# Patient Record
Sex: Female | Born: 1960 | Race: White | Hispanic: No | Marital: Married | Smoking: Never smoker
Health system: Southern US, Community
[De-identification: ages and names within clinical notes are randomized; demographics above are authoritative.]

## PROBLEM LIST (undated history)

## (undated) DIAGNOSIS — I341 Nonrheumatic mitral (valve) prolapse: Secondary | ICD-10-CM

## (undated) DIAGNOSIS — E785 Hyperlipidemia, unspecified: Secondary | ICD-10-CM

## (undated) DIAGNOSIS — E039 Hypothyroidism, unspecified: Secondary | ICD-10-CM

## (undated) DIAGNOSIS — F329 Major depressive disorder, single episode, unspecified: Secondary | ICD-10-CM

## (undated) DIAGNOSIS — T8859XA Other complications of anesthesia, initial encounter: Secondary | ICD-10-CM

## (undated) DIAGNOSIS — K219 Gastro-esophageal reflux disease without esophagitis: Secondary | ICD-10-CM

## (undated) DIAGNOSIS — L509 Urticaria, unspecified: Secondary | ICD-10-CM

## (undated) DIAGNOSIS — F32A Depression, unspecified: Secondary | ICD-10-CM

## (undated) DIAGNOSIS — E079 Disorder of thyroid, unspecified: Secondary | ICD-10-CM

## (undated) DIAGNOSIS — L508 Other urticaria: Secondary | ICD-10-CM

## (undated) DIAGNOSIS — M810 Age-related osteoporosis without current pathological fracture: Secondary | ICD-10-CM

## (undated) HISTORY — DX: Other urticaria: L50.8

## (undated) HISTORY — PX: ANAL FISSURE REPAIR: SHX2312

## (undated) HISTORY — PX: EXPLORATORY LAPAROTOMY: SUR591

## (undated) HISTORY — PX: TONSILLECTOMY: SUR1361

## (undated) HISTORY — PX: BREAST BIOPSY: SHX20

## (undated) HISTORY — PX: ABDOMINAL HYSTERECTOMY: SHX81

## (undated) HISTORY — DX: Age-related osteoporosis without current pathological fracture: M81.0

---

## 1999-08-24 ENCOUNTER — Encounter: Payer: Self-pay | Admitting: Gastroenterology

## 1999-08-24 ENCOUNTER — Encounter: Admission: RE | Admit: 1999-08-24 | Discharge: 1999-08-24 | Payer: Self-pay | Admitting: Gastroenterology

## 2000-02-22 ENCOUNTER — Encounter: Admission: RE | Admit: 2000-02-22 | Discharge: 2000-02-22 | Payer: Self-pay | Admitting: Family Medicine

## 2000-02-22 ENCOUNTER — Encounter: Payer: Self-pay | Admitting: Family Medicine

## 2000-06-02 ENCOUNTER — Encounter: Payer: Self-pay | Admitting: Surgery

## 2000-06-06 ENCOUNTER — Ambulatory Visit (HOSPITAL_COMMUNITY): Admission: RE | Admit: 2000-06-06 | Discharge: 2000-06-06 | Payer: Self-pay | Admitting: Surgery

## 2000-10-14 ENCOUNTER — Emergency Department (HOSPITAL_COMMUNITY): Admission: EM | Admit: 2000-10-14 | Discharge: 2000-10-14 | Payer: Self-pay | Admitting: Emergency Medicine

## 2000-10-14 ENCOUNTER — Encounter: Payer: Self-pay | Admitting: Surgery

## 2000-11-02 ENCOUNTER — Emergency Department (HOSPITAL_COMMUNITY): Admission: EM | Admit: 2000-11-02 | Discharge: 2000-11-02 | Payer: Self-pay | Admitting: Emergency Medicine

## 2000-11-06 ENCOUNTER — Encounter: Payer: Self-pay | Admitting: Gastroenterology

## 2000-11-06 ENCOUNTER — Encounter: Admission: RE | Admit: 2000-11-06 | Discharge: 2000-11-06 | Payer: Self-pay | Admitting: Gastroenterology

## 2000-11-15 ENCOUNTER — Encounter: Payer: Self-pay | Admitting: Surgery

## 2000-11-15 ENCOUNTER — Ambulatory Visit (HOSPITAL_COMMUNITY): Admission: RE | Admit: 2000-11-15 | Discharge: 2000-11-15 | Payer: Self-pay | Admitting: Surgery

## 2002-10-31 ENCOUNTER — Encounter: Payer: Self-pay | Admitting: Emergency Medicine

## 2002-10-31 ENCOUNTER — Observation Stay (HOSPITAL_COMMUNITY): Admission: EM | Admit: 2002-10-31 | Discharge: 2002-11-01 | Payer: Self-pay | Admitting: Emergency Medicine

## 2002-11-01 ENCOUNTER — Encounter: Payer: Self-pay | Admitting: Internal Medicine

## 2002-12-25 ENCOUNTER — Encounter: Admission: RE | Admit: 2002-12-25 | Discharge: 2002-12-25 | Payer: Self-pay | Admitting: Gastroenterology

## 2002-12-25 ENCOUNTER — Encounter: Payer: Self-pay | Admitting: Gastroenterology

## 2003-06-24 ENCOUNTER — Encounter: Admission: RE | Admit: 2003-06-24 | Discharge: 2003-09-22 | Payer: Self-pay | Admitting: Family Medicine

## 2003-08-01 ENCOUNTER — Encounter: Admission: RE | Admit: 2003-08-01 | Discharge: 2003-08-01 | Payer: Self-pay | Admitting: Neurosurgery

## 2003-09-17 ENCOUNTER — Encounter: Admission: RE | Admit: 2003-09-17 | Discharge: 2003-12-16 | Payer: Self-pay | Admitting: Family Medicine

## 2003-12-24 ENCOUNTER — Encounter: Admission: RE | Admit: 2003-12-24 | Discharge: 2004-03-23 | Payer: Self-pay | Admitting: Family Medicine

## 2004-01-18 ENCOUNTER — Encounter: Admission: RE | Admit: 2004-01-18 | Discharge: 2004-01-18 | Payer: Self-pay | Admitting: Family Medicine

## 2004-03-26 ENCOUNTER — Ambulatory Visit: Payer: Self-pay | Admitting: Family Medicine

## 2004-07-16 ENCOUNTER — Ambulatory Visit: Payer: Self-pay | Admitting: Family Medicine

## 2004-12-21 ENCOUNTER — Ambulatory Visit: Payer: Self-pay | Admitting: Family Medicine

## 2005-02-17 ENCOUNTER — Ambulatory Visit: Payer: Self-pay | Admitting: Family Medicine

## 2005-11-22 ENCOUNTER — Ambulatory Visit: Payer: Self-pay | Admitting: Internal Medicine

## 2005-11-29 ENCOUNTER — Encounter: Admission: RE | Admit: 2005-11-29 | Discharge: 2005-11-29 | Payer: Self-pay | Admitting: Internal Medicine

## 2005-12-28 ENCOUNTER — Encounter: Admission: RE | Admit: 2005-12-28 | Discharge: 2005-12-28 | Payer: Self-pay | Admitting: Neurological Surgery

## 2006-01-24 ENCOUNTER — Ambulatory Visit: Payer: Self-pay | Admitting: Internal Medicine

## 2006-04-03 ENCOUNTER — Ambulatory Visit: Payer: Self-pay | Admitting: Family Medicine

## 2006-04-05 ENCOUNTER — Ambulatory Visit: Payer: Self-pay | Admitting: Family Medicine

## 2006-05-22 ENCOUNTER — Ambulatory Visit: Payer: Self-pay | Admitting: Family Medicine

## 2006-12-08 DIAGNOSIS — K219 Gastro-esophageal reflux disease without esophagitis: Secondary | ICD-10-CM | POA: Insufficient documentation

## 2008-02-01 IMAGING — CR DG HAND COMPLETE 3+V*L*
2 series · 2 of 2 positions shown · non-contrast
Comparison: none

CLINICAL DATA: Swelling in middle finger, evaluate for possible arthritis.
 RIGHT HAND:
 Three views of the right hand show normal alignment with normal joint spaces.  No erosive changes are seen.  The radiocarpal joint space and carpal bones appear normal.

[view not recorded (1 of 2)]
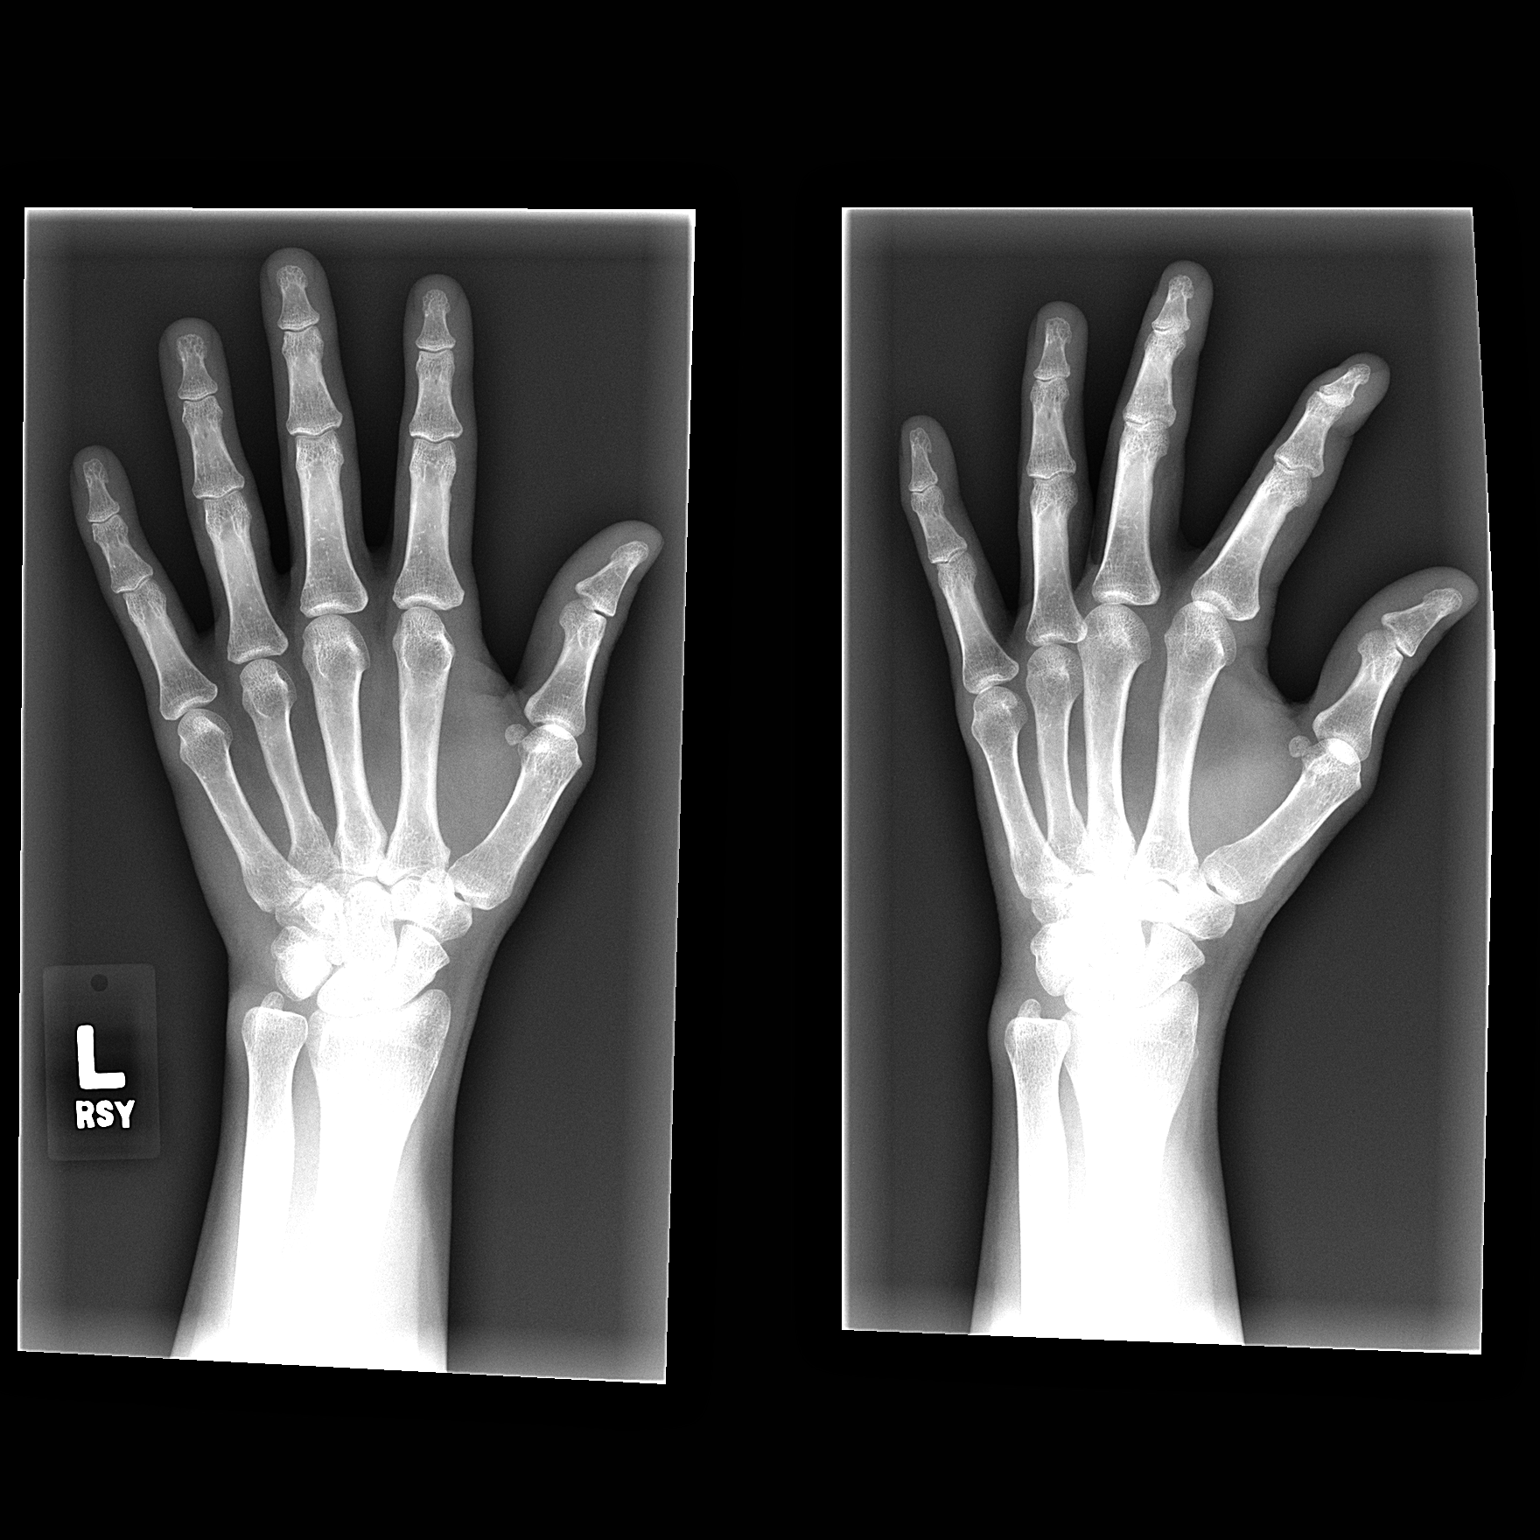

[view not recorded (2 of 2)]
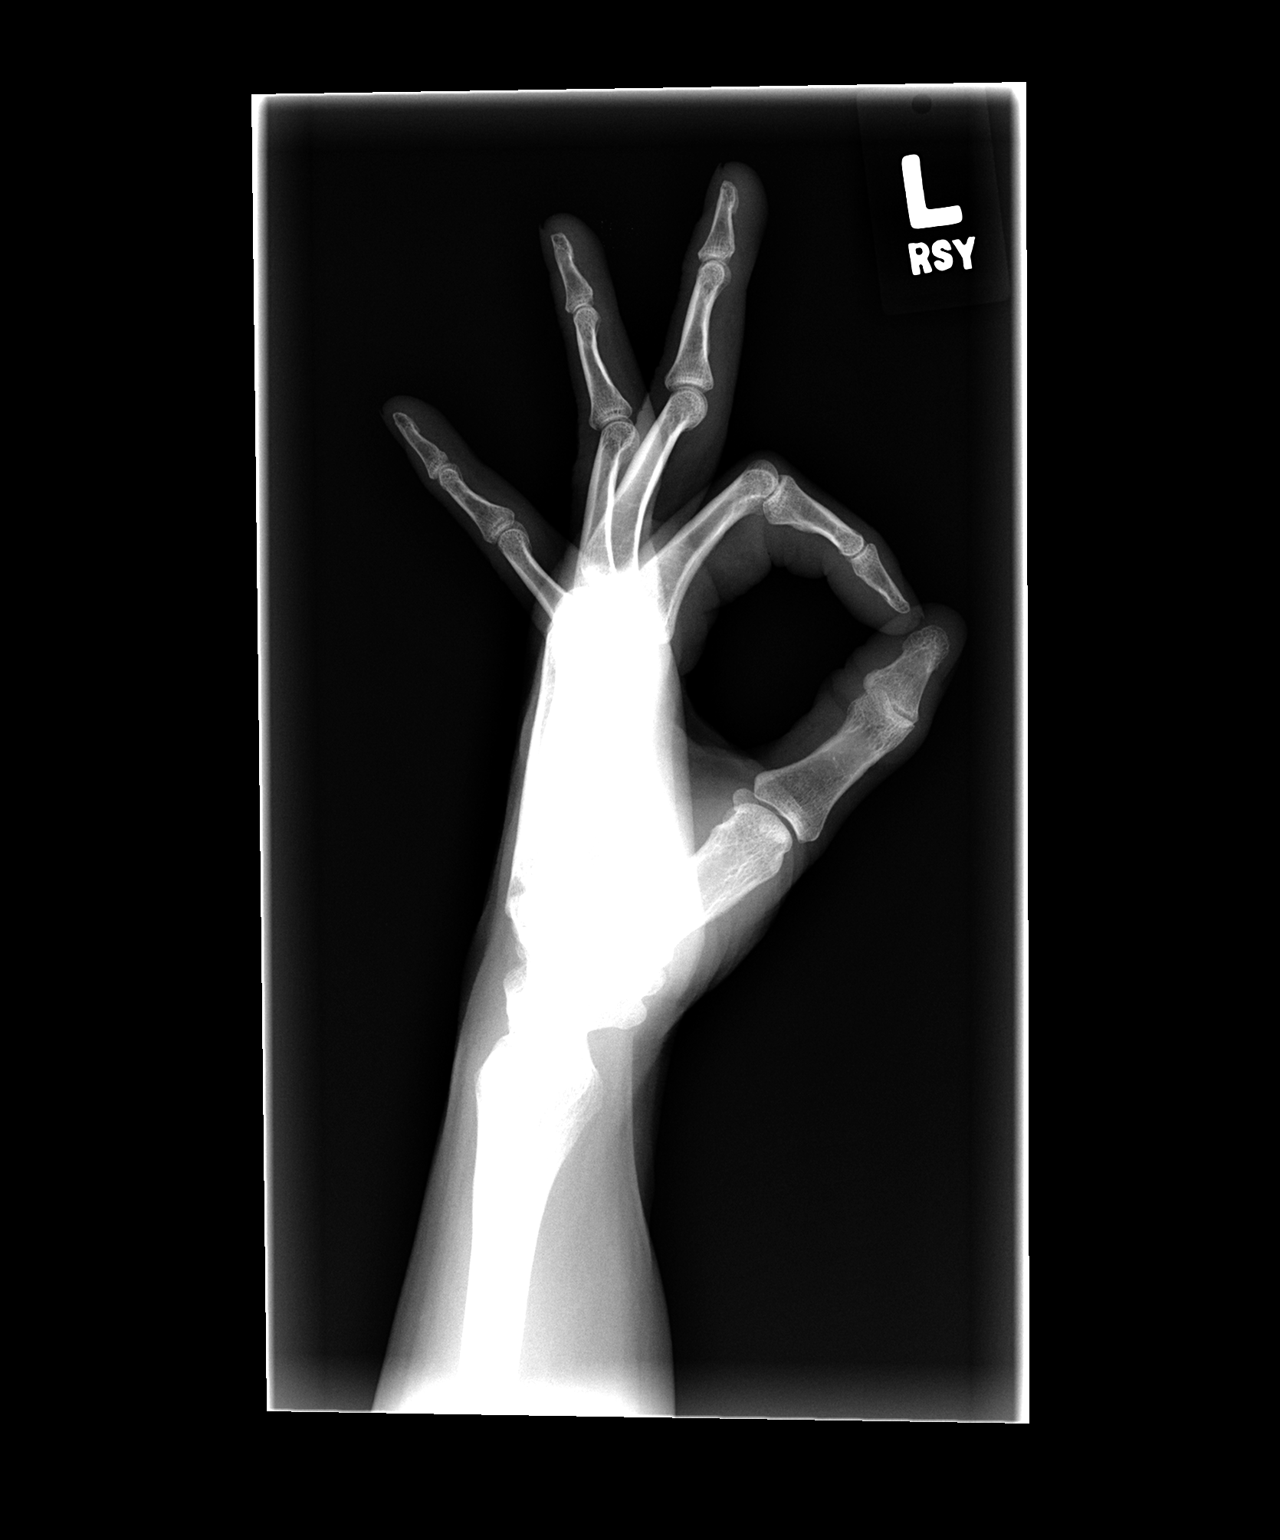

[2 of 2 positions shown; findings below may reference images not displayed]

IMPRESSION: Negative right hand.  No erosions.
 LEFT HAND:
 Three views of the left hand show no acute abnormality.  Alignment is normal.  No erosions are noted.
IMPRESSION: Negative left hand.

## 2009-12-26 ENCOUNTER — Ambulatory Visit: Payer: Self-pay | Admitting: Family Medicine

## 2009-12-26 DIAGNOSIS — S93609A Unspecified sprain of unspecified foot, initial encounter: Secondary | ICD-10-CM | POA: Insufficient documentation

## 2009-12-26 DIAGNOSIS — E782 Mixed hyperlipidemia: Secondary | ICD-10-CM | POA: Insufficient documentation

## 2009-12-26 DIAGNOSIS — F411 Generalized anxiety disorder: Secondary | ICD-10-CM | POA: Insufficient documentation

## 2009-12-26 DIAGNOSIS — M949 Disorder of cartilage, unspecified: Secondary | ICD-10-CM

## 2009-12-26 DIAGNOSIS — M79609 Pain in unspecified limb: Secondary | ICD-10-CM | POA: Insufficient documentation

## 2009-12-26 DIAGNOSIS — F329 Major depressive disorder, single episode, unspecified: Secondary | ICD-10-CM

## 2009-12-26 DIAGNOSIS — M899 Disorder of bone, unspecified: Secondary | ICD-10-CM | POA: Insufficient documentation

## 2009-12-26 DIAGNOSIS — E785 Hyperlipidemia, unspecified: Secondary | ICD-10-CM | POA: Insufficient documentation

## 2009-12-26 DIAGNOSIS — F3289 Other specified depressive episodes: Secondary | ICD-10-CM | POA: Insufficient documentation

## 2010-06-06 ENCOUNTER — Encounter: Payer: Self-pay | Admitting: Neurosurgery

## 2010-06-15 NOTE — Assessment & Plan Note (Signed)
Summary: R foot pain x last night rm 4   Vital Signs:  Patient Profile:   50 Years Old Female CC:      R foot pain x last night Height:     60 inches Weight:      156 pounds O2 Sat:      100 % O2 treatment:    Room Air Temp:     98.3 degrees F oral Pulse rate:   78 / minute Pulse rhythm:   regular Resp:     16 per minute BP sitting:   131 / 80  (left arm)  Vitals Entered By: Areta Haber CMA (December 26, 2009 11:32 AM)                  Current Allergies (reviewed today): ! SULFA        History of Present Illness Chief Complaint: R foot pain x last night History of Present Illness:  Subjective:  Patient twisted right foot on stairs last night and felt a popping sensation.  She has had persistent pain over the mid-foot with weight-bearing.  Current Problems: FOOT SPRAIN, RIGHT (ICD-845.10) FOOT PAIN, RIGHT (ICD-729.5) DEPRESSION (ICD-311) ANXIETY (ICD-300.00) FAMILY HISTORY OF MELANOMA (ICD-V16.8) HYPERLIPIDEMIA (ICD-272.4) OSTEOPENIA (ICD-733.90) GERD (ICD-530.81)   Current Meds LOVASTATIN 20 MG TABS (LOVASTATIN) 1 tab by mouth once daily PROTONIX 40 MG TBEC (PANTOPRAZOLE SODIUM) 1 tab by mouth once daily MIL-A-MULSION 10-15000 UNIT/DROP EMUL (MULTIPLE VITAMIN) every 4 weeks ZOLOFT 100 MG TABS (SERTRALINE HCL) 1 tab by mouth once daily ABILIFY 2 MG TABS (ARIPIPRAZOLE) 1 tab by mouth once daily LAMICTAL 150 MG TABS (LAMOTRIGINE) 1 tab by mouth once daily SYNTHROID 50 MCG TABS (LEVOTHYROXINE SODIUM) 1 tab by mouth once daily  REVIEW OF SYSTEMS Constitutional Symptoms      Denies fever, chills, night sweats, weight loss, weight gain, and fatigue.  Eyes       Denies change in vision, eye pain, eye discharge, glasses, contact lenses, and eye surgery. Ear/Nose/Throat/Mouth       Denies hearing loss/aids, change in hearing, ear pain, ear discharge, dizziness, frequent runny nose, frequent nose bleeds, sinus problems, sore throat, hoarseness, and tooth pain  or bleeding.  Respiratory       Denies dry cough, productive cough, wheezing, shortness of breath, asthma, bronchitis, and emphysema/COPD.  Cardiovascular       Denies murmurs, chest pain, and tires easily with exhertion.    Gastrointestinal       Denies stomach pain, nausea/vomiting, diarrhea, constipation, blood in bowel movements, and indigestion. Genitourniary       Denies painful urination, kidney stones, and loss of urinary control. Neurological       Denies paralysis, seizures, and fainting/blackouts. Musculoskeletal       Complains of decreased range of motion and swelling.      Denies muscle pain, joint pain, joint stiffness, redness, muscle weakness, and gout.      Comments: R foot pain x last night Skin       Denies bruising, unusual mles/lumps or sores, and hair/skin or nail changes.  Psych       Denies mood changes, temper/anger issues, anxiety/stress, speech problems, depression, and sleep problems. Other Comments: Pt states she stepped on laundry last night and twisted her R foot.   Past History:  Past Surgical History: Last updated: 12/08/2006 T&A-1993 TAH abdominal-06/2002 Caesarean section-1995 Cystoscopy1985 Anal Fissure Repair-1990 Endoscopy-1996  Past Medical History: MVP TMJ GERD Osteopenia Eating disorder Hyperlipidemia Vitamin D Deficiency Anxiety Depression  Family History: Family History of Arthritis Family History Hypertension Family History of Melanoma Family History of Prostate CA 1st degree relative <50 Family History Thyroid disease  Social History: Never Smoked Alcohol use-no Drug use-no Regular exercise-no Smoking Status:  never Drug Use:  no Does Patient Exercise:  no   Objective:  Appearance:  Patient appears healthy, stated age, and in no acute distress  Right ankle:  Nontender, full range of motion  Right foot:  No swelling or deformity.  Tenderness dorsally over 2nd, 3rd, and 4th metatarsals.  Distal neurovascular  intact.  Toes have full range of motion  X-ray right foot:  negative Assessment New Problems: FOOT SPRAIN, RIGHT (ICD-845.10) FOOT PAIN, RIGHT (ICD-729.5) DEPRESSION (ICD-311) ANXIETY (ICD-300.00) FAMILY HISTORY OF MELANOMA (ICD-V16.8) HYPERLIPIDEMIA (ICD-272.4) OSTEOPENIA (ICD-733.90)   Plan New Orders: T-DG Foot Complete*R* [73630] New Patient Level III [78295] Planning Comments:   Continue Aleve, 2 tabs two times a day.  Apply ice pack several times daily.  Wear well-fitting footwear with arch support.  Avoid running, jogging until improved.  Begin stretching exercises ([employer] information and instruction patient handout given)  Follow-up with [facility] if not improving several weeks.   The patient and/or caregiver has been counseled thoroughly with regard to medications prescribed including dosage, schedule, interactions, rationale for use, and possible side effects and they verbalize understanding.  Diagnoses and expected course of recovery discussed and will return if not improved as expected or if the condition worsens. Patient and/or caregiver verbalized understanding.   Orders Added: 1)  T-DG Foot Complete*R* [73630] 2)  New Patient Level III [62130]  Appended Document: R foot pain x last night rm 4 F/U call to patient @ [phone] @ 5:50pm - Unable to leave message.

## 2010-09-09 ENCOUNTER — Telehealth: Payer: Self-pay | Admitting: Internal Medicine

## 2010-10-01 NOTE — H&P (Signed)
NAME:  Kathleen Soto, Kathleen Soto                       ACCOUNT NO.:  [id number]   MEDICAL RECORD NO.:  [id number]                   PATIENT TYPE:  INP   LOCATION:  4705                                 FACILITY:  [facility]   PHYSICIAN:  Pricilla Riffle, M.D.                 DATE OF BIRTH:  10-Dec-1960   DATE OF ADMISSION:  10/31/2002  DATE OF DISCHARGE:                                HISTORY & PHYSICAL   IDENTIFICATION:  Kathleen Soto is a 50 year old woman with no prior cardiac  history who woke up this morning with chest tightness.   HISTORY OF PRESENT ILLNESS:  The patient has recently finished a steroid  Dosepak a couple of days ago.  She notes Tuesday having the last dose of  prednisone and feeling somewhat jittery.  She did not take it since  yesterday, she just felt blah.  This morning about 6:00 a.m. she woke up  with chest pain radiating to her shoulders, axilla, up her throat with some  diaphoresis and dyspnea.  Pain waxed and waned.  She slowly got ready for  work.  She said if she were to do things the pain would increase; she had to  slow down and wait for it to ease off.  Again, she did this through the  morning.  She continued to have chest pressure, some palpitations, and felt  her heart pounding harder.  She noted overall at most severe it was 8/10.  At work she continued to have the chest pressure again waxing and waning.  She spoke to Dr. Clent Ridges and was sent to see Dr. Andrey Campanile and from there was  brought to the Emergency Room.   She notes no prior episodes of this.  She notes some diarrhea this morning  and slight nausea, no vomiting.  No fever or chills.  No pleuritic  component.  No positional component.   No one is sick at home.   ALLERGIES:  SULFA.   CURRENT MEDICATIONS:  1. Wellbutrin 300 q.d.  2. Nexium 40 q.d.   PAST MEDICAL HISTORY:  1. Anxiety/depression.  2. History of mitral valve prolapse, last seen in 1985 by Cardiology.  3. History of palpitations 2 days ago.  4. History of bulging cervical disk, treated with prednisone.  5. History of GE reflux.  NOTE:  The current pain is different from reflux.   PAST SURGICAL HISTORY:  1. TAH.  2. Laparoscopy.  3. Tonsillectomy.  4. C-section x 2.   SOCIAL HISTORY:  The patient is married and works at [employer].  Does not smoke.  Drinks occasionally.   FAMILY HISTORY:  Father with hypertension and CLL.  Two brothers well.  Two  sisters, one with rheumatoid arthritis.   REVIEW OF SYSTEMS:  Overall negative as above problem, except as noted  above, have reviewed.   PHYSICAL EXAMINATION:  The patient is in no acute distress.  Blood pressure  124/74.  Pulse 82 and regular.  Temperature 97.  NECK:  JVP is normal.  No bruits.  LUNGS:  Clear.  CARDIAC:  Regular rate and rhythm.  Normal S1 and S2.  No S3, S4, murmurs,  or clicks.  CHEST:  Nontender.  ABDOMEN:  Supple.  Nontender.  No hepatosplenomegaly.  EXTREMITIES:  2+ pulses throughout no lower extremities.   12-lead EKG shows normal sinus rhythm at a rate of 83 beats per minute.   IMPRESSION:  Kathleen Soto is a 50 year old woman with history of chest pain.  This is new onset.  She has a history of reflux.  This is different.  It is  not pleuritic.  It does not appear to be musculoskeletal.  Not convinced her  pain is cardiac, maybe GI still.  I cannot explain it for any reason.  I  would recommended, therefore, ruling out for MI, admit.  Telemetry.  Check  CK-MB.  If enzymes are negative, plan a stress Cardiolite to further define.  We will begin low dose Lopressor, heparin, aspirin.  We will also increase  Protonix b.i.d.                                                Pricilla Riffle, M.D.    PVR/MEDQ  D:  10/31/2002  T:  11/01/2002  Job:  [id number]

## 2010-10-01 NOTE — Op Note (Signed)
[place]  Patient:    Kathleen Soto, Kathleen Soto                   MRN: [id number] Attending:  Thornton Park. Daphine Deutscher, M.D. CC:         Evette Georges, M.D. [facility]  Dr. Houston Siren, Gregor Hams,  [facility]   Operative Report  CCS# [phone]  PREOPERATIVE DIAGNOSIS:  Left breast mass.  POSTOPERATIVE DIAGNOSIS:  Left breast mass, probably benign.  OPERATION PERFORMED:  Left breast biopsy.  SURGEON:  Thornton Park. Daphine Deutscher, M.D.  ANESTHESIA:  MAC/  DESCRIPTION OF PROCEDURE:  [name] is a 50 year old lady with a positive family history of breast cancer who was seen in the office for a palpable mass in the left breast.  This was a soft, discrete mass at about the 4 oclock position in the left breast, two fingerbreadths from the areolar margin.  Preoperatively, she and I localized the area and I marked it.  The breast was then prepped with Betadine and draped sterilely.  It was infiltrated with a mixture of Marcaine and lidocaine.  A curvilinear incision was made overlying the mass I dissected down to it.  There appeared to be some sclerotic fat down on the normal breast tissue.  I excised this in toto with two samples sent for permanent sections.  There was nothing grossly appearing to be cancer in this.  The area was then inspected carefully for bleeding. One figure-of-eight suture was placed in the base at the site of oozing.  The remaining portions of bleeding were controlled with electrocautery.  It was then irrigated with a mixture of Marcaine and lidocaine which was allowed to stay in there for a couple of minutes and then was closed with 4-0 Vicryl subcuticularly with benzoin and Steri-Strips.  The patient seemed to tolerate the procedure well.  She will be given a prescription for Tylox to take if needed for pain and be followed up in the office in two weeks. DD:  06/06/00 TD:  06/06/00 Job: [id number]

## 2010-10-01 NOTE — Assessment & Plan Note (Signed)
[facility]                                 ON-CALL NOTE   NAME:Kathleen Soto, Kathleen Soto                      MRN:          [id number]  DATE:06/01/2006                            DOB:          10-16-1960    PRIMARY CARE Miyeko Mahlum:  __________   HOME OFFICE:  [place]   TIME OF INTERACTION:  9:45 p.m.   PHONE NUMBER:  [phone]   OBJECTIVE:  The patient has nausea and diarrhea.  The last couple of  days, she had what she felt like pre-bronchitis symptoms with feelings  of congestion in her chest, was out of town today most of the day and,  on her way back, started becoming queasy, has felt nauseated with loose  bowels later today.  Has been trying to eat crackers and clear liquids.  She still has her appetite and is able to drink and keep things down,  but is requesting medication for nausea.  Has been on Phenergan in the  past without difficulty.   ASSESSMENT:  Presumed gastroenteritis.   PLAN:  Called a prescription to [facility] in [place] at [phone] for  Phenergan 25 mg suppositories one per rectum q. 6 p.r.n. #5 with no  refills, and Phenergan 25 mg tablets one p.o. q. 6 p.r.n., both for  nausea, #10 with no refills.     Arta Silence, MD  Electronically Signed    RNS/MedQ  DD: 06/01/2006  DT: 06/01/2006  Job #: [id number]

## 2010-10-01 NOTE — Assessment & Plan Note (Signed)
[facility]                                 ON-CALL NOTE   NAME:HODGINKatja, Blue                      MRN:          [id number]  DATE:05/13/2006                            DOB:          09-23-1960    Phone number [phone] cell phone.  The patient woke today with dry,  itchy eyes, and some crusting and redness, thinks she may be getting  pink eye, although it is somewhat better as she speaks.  She has no  associated fevers.  She does have young children, no contact, no  significant URI symptoms.  She is an [employer] fundraiser.   ALLERGIES:  SULFA.   We will call in a prescription for tobramycin 1 drop q.i.d. to one or  both eyes as affected.  I will have her start these if symptoms progress  with purulent discharge, otherwise can follow up as needed.  This is  called in to [facility] at [facility].     Neta Mends. Panosh, MD  Electronically Signed    WKP/MedQ  DD: 05/13/2006  DT: 05/13/2006  Job #: [id number]

## 2010-10-01 NOTE — Consult Note (Signed)
[facility]  Patient:    Kathleen Soto, Kathleen Soto                    MRN: [id number] Proc. Date: 11/02/00 Adm. Date:  [id number] Disc. Date: [id number] Attending:  Benny Lennert CC:         Petra Kuba, M.D.             Houston Siren, M.D.; [facility] [address] #                          Consultation Report  CHIEF COMPLAINT:  Chronic abdominal pain.  HISTORY OF PRESENT ILLNESS:  This is a 50 year old white female who has a six week history of abdominal pain.  She states it hurts constantly in the mid and lower abdomen and across her back.  For the past four weeks it has been a little more sharp and catching.  She was initially evaluated at the [facility] ER on October 14, 2000 at which time she saw Dr. Cicero Duck.  A CT of the abdomen and pelvis was completely normal and specifically the appendix appeared normal.  The ovaries had small cysts.  There was no free fluid or focal inflammatory process.  She has been evaluated as an outpatient by Dr. Leary Roca here in [place].  She has been evaluated by her gynecologist Dr. Houston Siren of [facility] in [place].  She has not had any further testing other than a vaginal ultrasound on June 5 at Dr. Anders Grant office and she was told that was normal.  She has not had any colonoscopy or other evaluations.  She states for the past two weeks she has had some different problems.  Nine days ago she vomited and had some diarrhea and then the vomiting and diarrhea resolved.  Five days ago she had one episode of vomiting and that has now resolved.  She has been constipated mostly over the past week.  She is able to eat and has not had any fever or chills.  Denies vaginal discharge or blood in the stool.  She notes some abdominal bloating over the past three days.  She saw Dr. Houston Siren in the office today.  He gave her a shot of Rocephin in case she had pelvic inflammatory  disease.  He got a CBC which was completely normal.  She was referred over to our office for evaluation for general surgery.  She saw Dr. Mosetta Anis in the office today who felt she had right lower quadrant tenderness and asked me to evaluate her in the [facility] ER.  Currently, the patient is in no acute distress other than being frustrated by the extensive workup.  She states that she is scheduled for a barium enema on Monday at Dr. Adriana Simas suggestion.  She also states that she is scheduled for a diagnostic laparoscopy by Dr. Houston Siren in early July.  PAST MEDICAL HISTORY:  The patient has had anal fissure repair by me.  She has had two cesarean sections.  She had a tonsillectomy.  She had a cystoscopy by Dr. Mickel Crow in 1985.  She has had a breast biopsy by Dr. Wenda Low.  She also has mitral valve prolapse and takes SBE prophylaxis.  She has mild reflux symptoms.  She has a history of depression.  She had an upper endoscopy by Dr. Victorino Dike in 1996 and was told  she had a very tiny hiatal hernia, but no esophagitis.  CURRENT MEDICATIONS: 1. Birth control pills. 2. Wellbutrin SR 150 mg q.d. 3. Elavil q.h.s. p.r.n. 4. Prilosec p.r.n.  ALLERGIES:  SULFA causes a skin rash.  SOCIAL HISTORY:  The patient lives in [place] with her husband.  They are married.  They have two children.  She denies the use of tobacco.  Drinks alcohol rarely.  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  FAMILY HISTORY:  Father living age 82 has chronic lymphocytic leukemia, hypertension, depression, and diverticulitis.  Mother living age 60 has a heart murmur, hypertension, and colon polyps.  She has four siblings, one with rheumatoid arthritis, one with ovarian cyst.  REVIEW OF SYSTEMS:  All systems are reviewed and are negative and are noncontributory except as described above.  PHYSICAL EXAMINATION  GENERAL:  Pleasant young woman who  appears somewhat frustrated, but is in no acute distress.  She moves around in bed easily.  VITAL SIGNS:  Temperature 98.0, respirations 18, pulse 80, blood pressure 116/76.  HEENT:  Sclerae:  Clear.  Extraocular movements are intact.  Oropharynx: Clear.  No lesions.  NECK:  Supple, nontender.  No mass.  No thyromegaly.  No adenopathy.  No bruit.  No jugular venous distention.  LUNGS:  Clear to auscultation.  No CVA tenderness.  HEART:  Regular rate and rhythm with a faint systolic click.  ABDOMEN:  Normal active bowel sounds all four quadrants.  The abdomen is soft. I cannot detect any significant distention.  I do not detect any hernia in the umbilical, inguinal, or femoral areas.  She does have some lower abdominal tenderness, but really no peritoneal signs.  There is no involuntary guarding and no rebound but the examination is somewhat variable.  I do not feel a mass.  EXTREMITIES:  No edema.  Good pulses.  NEUROLOGIC:  Grossly within normal limits.  LABORATORIES:  White blood cell count 7000, white blood cell differential is completely normal with 58 neutrophils, 33 lymphocytes, 8 monocytes, 1 eosinophil, and 0 basophils, hemoglobin 14.5.  Urine pregnancy test negative. Urinalysis is normal.  Lipase 21, amylase 59.  Comprehensive metabolic panel including liver function tests are normal.  IMPRESSION: 1. Chronic lower abdominal pain, etiology unclear.  This is atypical for an acute inflammatory process and I doubt that she has acute appendicitis. Possibility of adhesions, occult ovarian disease, chronic appendicitis are all possible. 2. There is no apparent indication for emergent surgery at this point as I doubt that she has an acute inflammatory, obstructive, or ischemic process.  PLAN:  We spent a long time discussing options/strategy for resolving her problem. Ultimately, she decided that she would prefer to center her medical care in [place].  We decided that  she would proceed with the barium enema on Monday and then call and get a followup appointment with one of the  surgeons in our group next week.  We will need to review the vaginal ultrasound reports from Dr. Houston Siren and will also need to review Dr. Heide Spark notes.  Ultimately, I suspect we will need to perform a diagnostic laparoscopy and laparoscopic appendectomy with a gynecologist on standby in case we find occult gynecological disease.  At this point this plan is acceptable to them.  They were advised to call me later tonight if her pain worsens or if she develops nausea or vomiting or fever.  Otherwise, we will see her in the office  next week. DD:  11/02/00 TD:  11/03/00 Job: [id number]

## 2010-10-01 NOTE — Assessment & Plan Note (Signed)
[facility]                                 ON-CALL NOTE   NAME:HODGINMinda, Faas                      MRN:          [id number]  DATE:08/12/2006                            DOB:          1960-06-30    DATE OF TELEPHONE CALL:  August 12, 2006, at 8:08 p.m.   DATE OF BIRTH:  14-May-1961.   PHONE NUMBER:  [phone].   REGULAR DOCTOR:  Dr. Fabian Sharp, and I am Dr. Milinda Antis on call.   CHIEF COMPLAINT:  Brown vaginal discharge.   The patient states she has been feeling fine.  She had a hysterectomy  four years ago, which was partial, she still has her ovaries.  She has  had a little bit of vaginal burning lately and has noticed a small  amount of brown discharge today.  No bright red blood, no odor or severe  itching.  However, she says that she is going out of town tomorrow to  [place] for a week and wants to know if there is anything to be  worried about.  I told her the most likely case was that she had some  sort of vaginitis or has had a little bleeding from trauma.  I told her  that if her symptoms continued tomorrow to go to Urgent Care and get it  checked out before she leaves town.  If she develops cramping, bleeding,  or worsening symptoms tonight, she will call back or go the emergency  room for evaluation.  Otherwise, she will follow up with Dr. Fabian Sharp for  an exam when she returns.     Marne A. Tower, MD  Electronically Signed    MAT/MedQ  DD: 08/12/2006  DT: 08/12/2006  Job #: [id number]   cc:   Neta Mends. Fabian Sharp, MD

## 2010-10-01 NOTE — Discharge Summary (Signed)
NAME:  Kathleen Soto, Kathleen Soto                       ACCOUNT NO.:  [id number]   MEDICAL RECORD NO.:  [id number]                   PATIENT TYPE:  INP   LOCATION:  4705                                 FACILITY:  [facility]   PHYSICIAN:  Kathleen Soto, M.D.                 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  She was admitted to [facility]  on  October 31, 2002 with chest pain associated with diaphoresis and nausea.  The  patient was admitted for further evaluation of coronary artery disease.   PAST MEDICAL HISTORY:  1. Significant for anxiety and depression.  2. History of mitral valve prolapse diagnosed in 1985, history of     complications approximately two days prior to this admission.  3. History of a bulging cervical disk treated with prednisone.  4. History of gastroesophageal reflux.   ALLERGIES:  SULFA.   MEDICATIONS PRIOR TO ADMISSION:  1. Wellbutrin 300 mg daily.  2. Nexium 40 mg daily.  3. The patient recently a steroid dose pack.   PAST SURGICAL HISTORY:  1. Status post total abdominal hysterectomy.  2. Laparoscopy.  3. Tonsillectomy.  4. Cesarean section x2.   SOCIAL HISTORY:  The patient is married.  She works at [employer].  She does not smoke.  She drinks occasionally.   FAMILY HISTORY:  The patient's father has hypertension and chronic  lymphocytic leukemia.  She has two brothers alive and well.  Two sisters one  with rheumatoid arthritis.   [facility] COURSE:  As noted, this patient was admitted to [facility]  on October 31, 2002 by [facility] for further evaluation of chest pain.  Enzymes were found to be negative x3.  The patient was scheduled for an  exercise Cardiolite.  A D-dimer was also checked which was found to be  negative.   The  patient had her exercise Cardiolite performed on November 01, 2002.  She had  good exercise tolerance.  She was able to exercise for 10 minutes reaching a  maximum heart rate of 178 beats per minute.  Her target heart rate was 142  beats per minute.  She had no chest pain.  There were no EKG changes.  This  was felt to be a negative study.  Arrangements were made to discharge the  patient home later that evening.  The etiology of her pain is uncertain.  Her images showed no ischemia with an EF 59%.  She was to follow up with her  primary care physician after discharge.   LABORATORY DATA:  As noted her D-dimer was negative.  Her cardiac enzymes  were negative.  Comprehensive metabolic panel was within normal  limits.  A  CBC was within normal limits.  A TSH was pending at the time of this  dictation.  Chest x-ray showed no active disease.   DISCHARGE MEDICATIONS:  The patient was told to continue her medications as  prior to admission.  Please list as noted above.   ACTIVITY:  As tolerated.   DIET:  She was told to stay on a low-salt, low-fat diet.   FOLLOW UP:  She is to follow up with [facility]. She was told to call for an  appointment.  She was to [facility] as needed.   PROBLEM LIST AT TIME OF DISCHARGE:  1. Chest pain, negative cardiac enzymes, negative D-dimer, negative exercise     Cardiolite, ejection fraction 59%.  2. History of anxiety and depression.  3. History of mitral valve prolapse.  4. History of palpitations.  5. History of bulging cervical disk recently treated with prednisone dose     pack.  6. History of gastroesophageal reflux disease.  7. History of multiple surgeries.     Kathleen Soto, P.A. [facility]                  Kathleen Soto, M.D.    DR/MEDQ  D:  11/01/2002  T:  11/01/2002  Job:  [id number]   cc:   Charlies Soto, M.D.   Tera Mater. Clent Soto, M.D. [facility] C. Andrey Soto, M.D.  [address]  [place]  [place] [address]  Fax: [phone]    cc:    Charlies Soto, M.D.   Tera Mater. Clent Soto, M.D. [facility] C. Andrey Soto, M.D.  [address]  [place]  [place] [address]  Fax: [phone]

## 2010-10-01 NOTE — Consult Note (Signed)
[facility]  Patient:    Kathleen Soto, Kathleen Soto                    MRN: [id number] Proc. Date: 10/14/00 Adm. Date:  [id number] Disc. Date: [id number] Attending:  Osvaldo Human                          Consultation Report  CHIEF COMPLAINT:  Abdominal pain.  CLINICAL HISTORY:  I was asked by Dr. Margrett Rud to see this 50 year old who has had about a month of abdominal right flank discomfort.  This got a little worse a couple of days ago and she saw him yesterday.  He put her on Cipro, which she has not yet taken and noted that the pain was a little more in the right lower quadrant.  Today, she had increasing pain, burning, and a little bit going back to her back, but more right lower quadrant and he had noticed that she had increased tenderness from yesterday and was concerned of an atypical presentation for appendicitis.  The patient is not nauseated, but has been a little queasy and not real hungry.  She did eat a little bit for breakfast this morning.  She denies any change in her bowel habits.  She has no current urinary symptoms.  Dr. Andrey Campanile did a pregnancy test yesterday that was negative.  No other labs have been done, other than urinalysis which apparently showed some white cells at his office.  The patient is otherwise in good health.  PRIOR OPERATIONS:  Cesarean section, endoscopies, and a breast biopsy.  ALLERGIES:  SULFA.  CURRENT MEDICATIONS:  Wellbutrin and birth control pills, as well as Prilosec.  REVIEW OF SYSTEMS:  HEENT: Basically negative.  CHEST: No cough or shortness of breath.  HEART: Negative, except for a questionable history of a click or murmur, not currently on any medicines for that and no other symptoms. ABDOMEN: Negative, except for HPI and history of some peptic disease.  She has seen some of the GI doctors at [facility], but she is not sure which one has done endoscopies.  PHYSICAL EXAMINATION:  GENERAL:  The  patient is alert and oriented, in no distress.  HEENT:  Head is normocephalic.  Eyes nonicteric.  Pupils are round and regular.  NECK:  Supple, without masses or thyromegaly.  LUNGS:  Clear to auscultation.  ABDOMEN:  Soft, but distinctly tender in the right lower quadrant, but not a lot of guarding or rebound present.  Bowel sounds are present.  PELVIC/RECTAL:  Not done.  These were pretty much negative by Dr. Andrey Campanile.  EXTREMITIES:  No cyanosis or edema.  LABORATORY DATA:  Studies are currently pending.  VITAL SIGNS:  Temperature 98, pulse 79, blood pressure 127/78, respirations 18.  IMPRESSION:  Right lower abdominal pain of questionable etiology.  PLAN:  I think her story is a little suspicious for atypical appendicitis, but this could be pyelonephritis or some other urinary tract finding.  We will await CBC, CMET, but in the meantime we will ahead and order CT scan looking for some other abdominal process.  DD:  10/14/00 TD:  10/15/00 Job: [id number]

## 2010-12-31 NOTE — Telephone Encounter (Signed)
OPENED IN ERROR

## 2011-06-19 ENCOUNTER — Emergency Department
Admission: EM | Admit: 2011-06-19 | Discharge: 2011-06-19 | Disposition: A | Discharge status: Home / Self Care | Payer: PRIVATE HEALTH INSURANCE | Source: Home / Self Care | Attending: Family Medicine | Admitting: Family Medicine

## 2011-06-19 ENCOUNTER — Encounter: Payer: Self-pay | Admitting: Emergency Medicine

## 2011-06-19 DIAGNOSIS — M940 Chondrocostal junction syndrome [Tietze]: Secondary | ICD-10-CM

## 2011-06-19 DIAGNOSIS — J069 Acute upper respiratory infection, unspecified: Secondary | ICD-10-CM

## 2011-06-19 HISTORY — DX: Hyperlipidemia, unspecified: E78.5

## 2011-06-19 HISTORY — DX: Major depressive disorder, single episode, unspecified: F32.9

## 2011-06-19 HISTORY — DX: Depression, unspecified: F32.A

## 2011-06-19 HISTORY — DX: Disorder of thyroid, unspecified: E07.9

## 2011-06-19 MED ORDER — HYDROCOD POLST-CHLORPHEN POLST 10-8 MG/5ML PO LQCR: 5.0000 mL | Freq: Every evening | ORAL | Status: DC | PRN

## 2011-06-19 MED ORDER — AZITHROMYCIN 250 MG PO TABS: ORAL_TABLET | ORAL | Status: AC

## 2011-06-19 NOTE — ED Provider Notes (Signed)
History     CSN: [id number]  Arrival date & time 06/19/11  1744   First MD Initiated Contact with Patient 06/19/11 1759      Chief Complaint  Patient presents with  . Nasal Congestion      HPI Comments: Patient complains of three day history of gradually progressive URI symptoms beginning with a non-productive cough associated with tightness in her anterior chest.  She almost feels shortness of breath.  Her throat has been mildly irritated with coughing.  Complains of fatigue and myalgias that started yesterday.  Cough is now worse at night and generally non-productive during the day.  She has had minimal nasal congestion.    The history is provided by the patient.    Past Medical History  Diagnosis Date  . Thyroid disease   . Depression   . Psoriatic arthritis   . Hyperlipidemia     Past Surgical History  Procedure Date  . Cesarean section   . Exploratory laparotomy   . Tonsillectomy   . Anal fissure repair     Family History  Problem Relation Age of Onset  . Cancer Father   . Thyroid disease Father   . Thyroid disease Sister   . Cancer Brother     History  Substance Use Topics  . Smoking status: Never Smoker   . Smokeless tobacco: Not on file  . Alcohol Use: No    OB History    Grav Para Term Preterm Abortions TAB SAB Ect Mult Living                  Review of Systems + sore throat + cough, nonproductive No pleuritic pain, but complains of tightness in her anterior chest ? wheezing No nasal congestion ? post-nasal drainage No sinus pain/pressure No itchy/red eyes No earache No hemoptysis No SOB No fever, + chills No nausea No vomiting No abdominal pain No diarrhea No urinary symptoms No skin rashes + fatigue + myalgias + headache   Allergies  Sulfonamide derivatives  Home Medications   Current Outpatient Rx  Name Route Sig Dispense Refill  . ARIPIPRAZOLE 2 MG PO TABS Oral Take 2 mg by mouth daily.    Marland Kitchen LAMOTRIGINE 150 MG PO TABS  Oral Take 150 mg by mouth daily.    Marland Kitchen LEVOTHYROXINE SODIUM 88 MCG PO TABS Oral Take 88 mcg by mouth daily.    Marland Kitchen ROSUVASTATIN CALCIUM 20 MG PO TABS Oral Take 20 mg by mouth daily.    . SERTRALINE HCL 100 MG PO TABS Oral Take 100 mg by mouth daily.    . AZITHROMYCIN 250 MG PO TABS  Take 2 tabs today; then begin one tab once daily for 4 more days (Rx void after 06/27/11) 6 each 0  . HYDROCOD POLST-CPM POLST ER 10-8 MG/5ML PO LQCR Oral Take 5 mLs by mouth at bedtime as needed. 115 mL 0    BP 130/81  Pulse 87  Temp(Src) 98.1 F (36.7 C) (Oral)  Resp 18  Ht 5\' 1"  (1.549 m)  Wt 157 lb (71.215 kg)  BMI 29.67 kg/m2  SpO2 97%  Physical Exam Nursing notes and Vital Signs reviewed. Appearance:  Patient appears healthy, stated age, and in no acute distress Eyes:  Pupils are equal, round, and reactive to light and accomodation.  Extraocular movement is intact.  Conjunctivae are not inflamed  Ears:  Canals normal.  Tympanic membranes normal.  Nose:  Mildly congested turbinates.  No sinus tenderness.   Pharynx:  Normal Neck:  Supple.  Slightly tender shotty anterior/posterior nodes are palpated bilaterally  Lungs:  Clear to auscultation.  Breath sounds are equal.  Chest:  Distinct tenderness to palpation over the mid-sternum.  Heart:  Regular rate and rhythm without murmurs, rubs, or gallops.  Abdomen:  Nontender without masses or hepatosplenomegaly.  Bowel sounds are present.  No CVA or flank tenderness.  Extremities:   Trace lower leg edema.  No calf tenderness Skin:  No rash present.   ED Course  Procedures  none      1. Acute upper respiratory infections of unspecified site   2. Costochondritis, acute       MDM  There is no evidence of bacterial infection today.   Treat symptomatically for now: Take Mucinex D (guaifenesin with decongestant) twice daily for congestion, or Mucinex plus Sudafed.  Increase fluid intake, rest. May use Afrin nasal spray (or generic oxymetazoline) twice  daily for about 5 days.  Also recommend using saline nasal spray several times daily and saline nasal irrigation (AYR is a common brand) Stop all antihistamines for now, and other non-prescription cough/cold preparations. May take Ibuprofen 200mg , 4 tabs every 8 hours with food for chest/sternum discomfort. Begin Azithromycin if not improving about 5 days or if persistent fever develops (given Rx to hold) Follow-up with family doctor if not improving 7 to 10 days.         Donna Christen, MD 06/19/11 [phone]

## 2011-06-19 NOTE — ED Notes (Signed)
Has had congestion, fever, eye pain, aches, general aches, and irritated throat.

## 2012-02-28 IMAGING — CR DG FOOT COMPLETE 3+V*R*
3 series · 3 of 3 positions shown · non-contrast
Comparison: None

CLINICAL DATA: Twisted right foot.  Right foot injury.

RIGHT FOOT COMPLETE - 3+ VIEW

[view not recorded (1 of 3)]
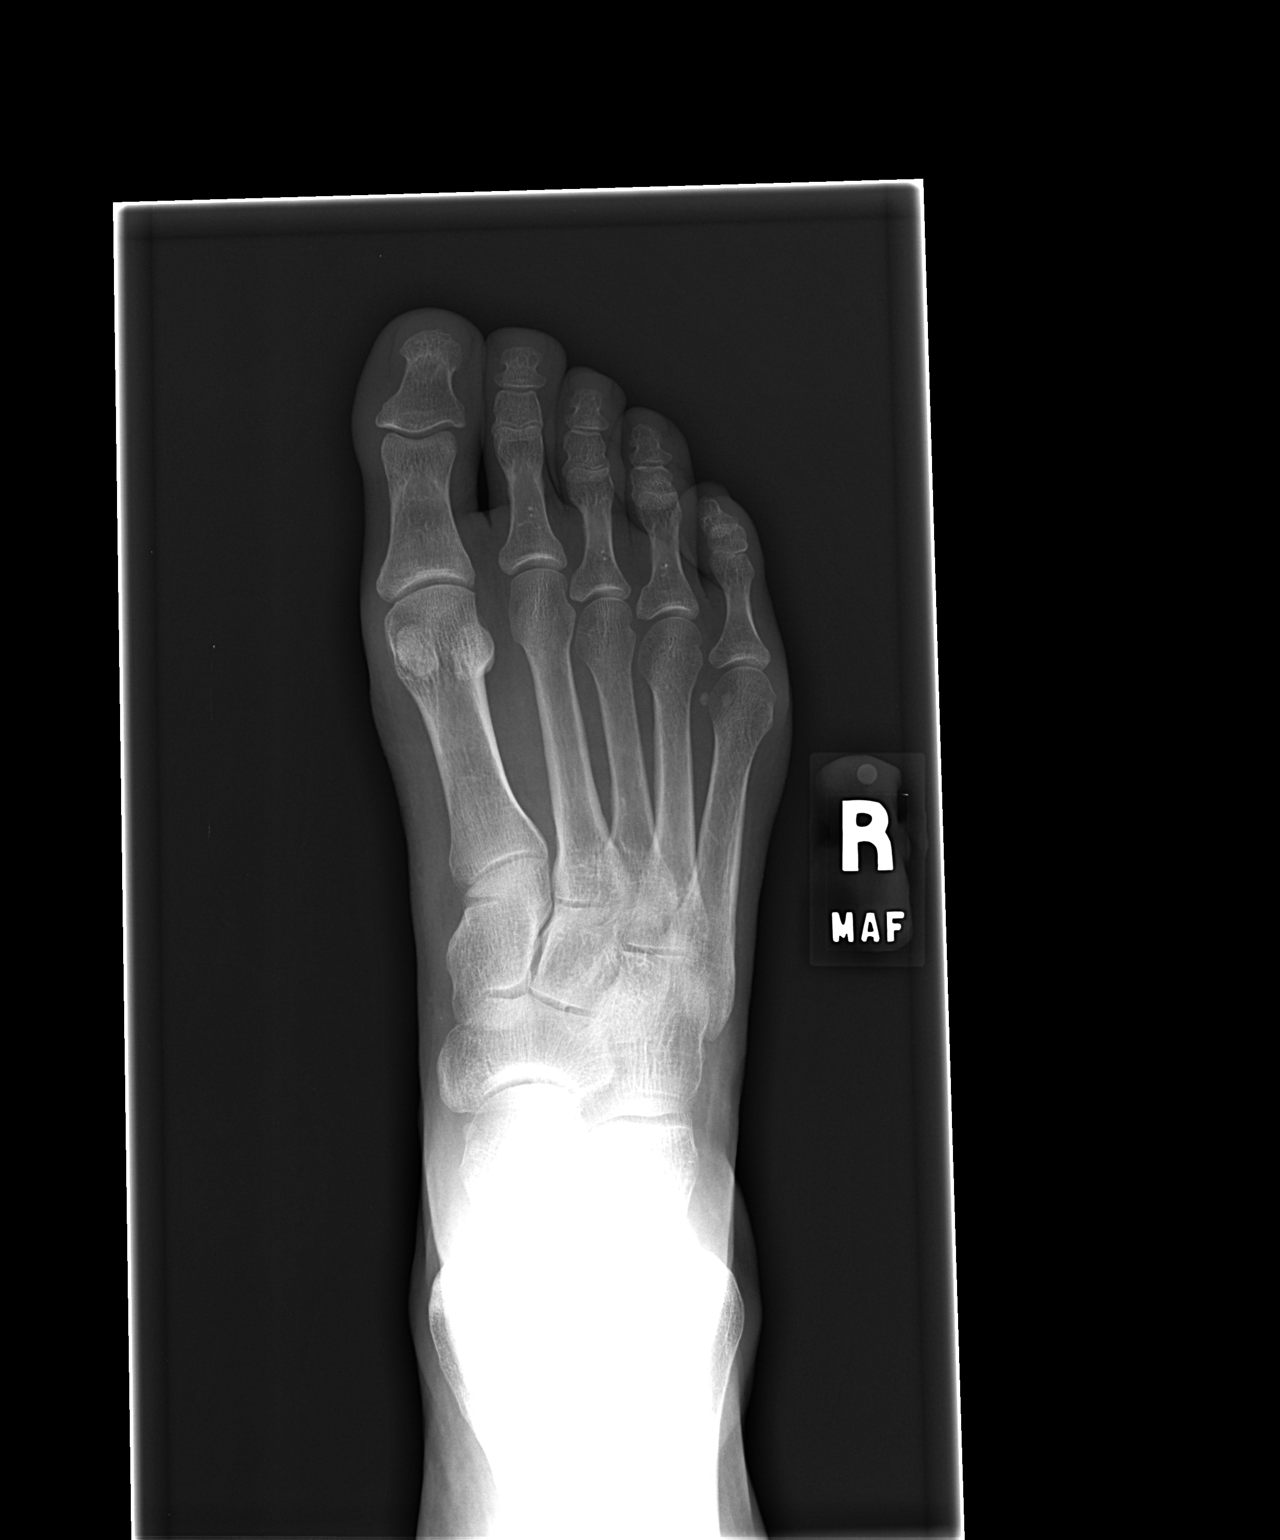

[view not recorded (2 of 3)]
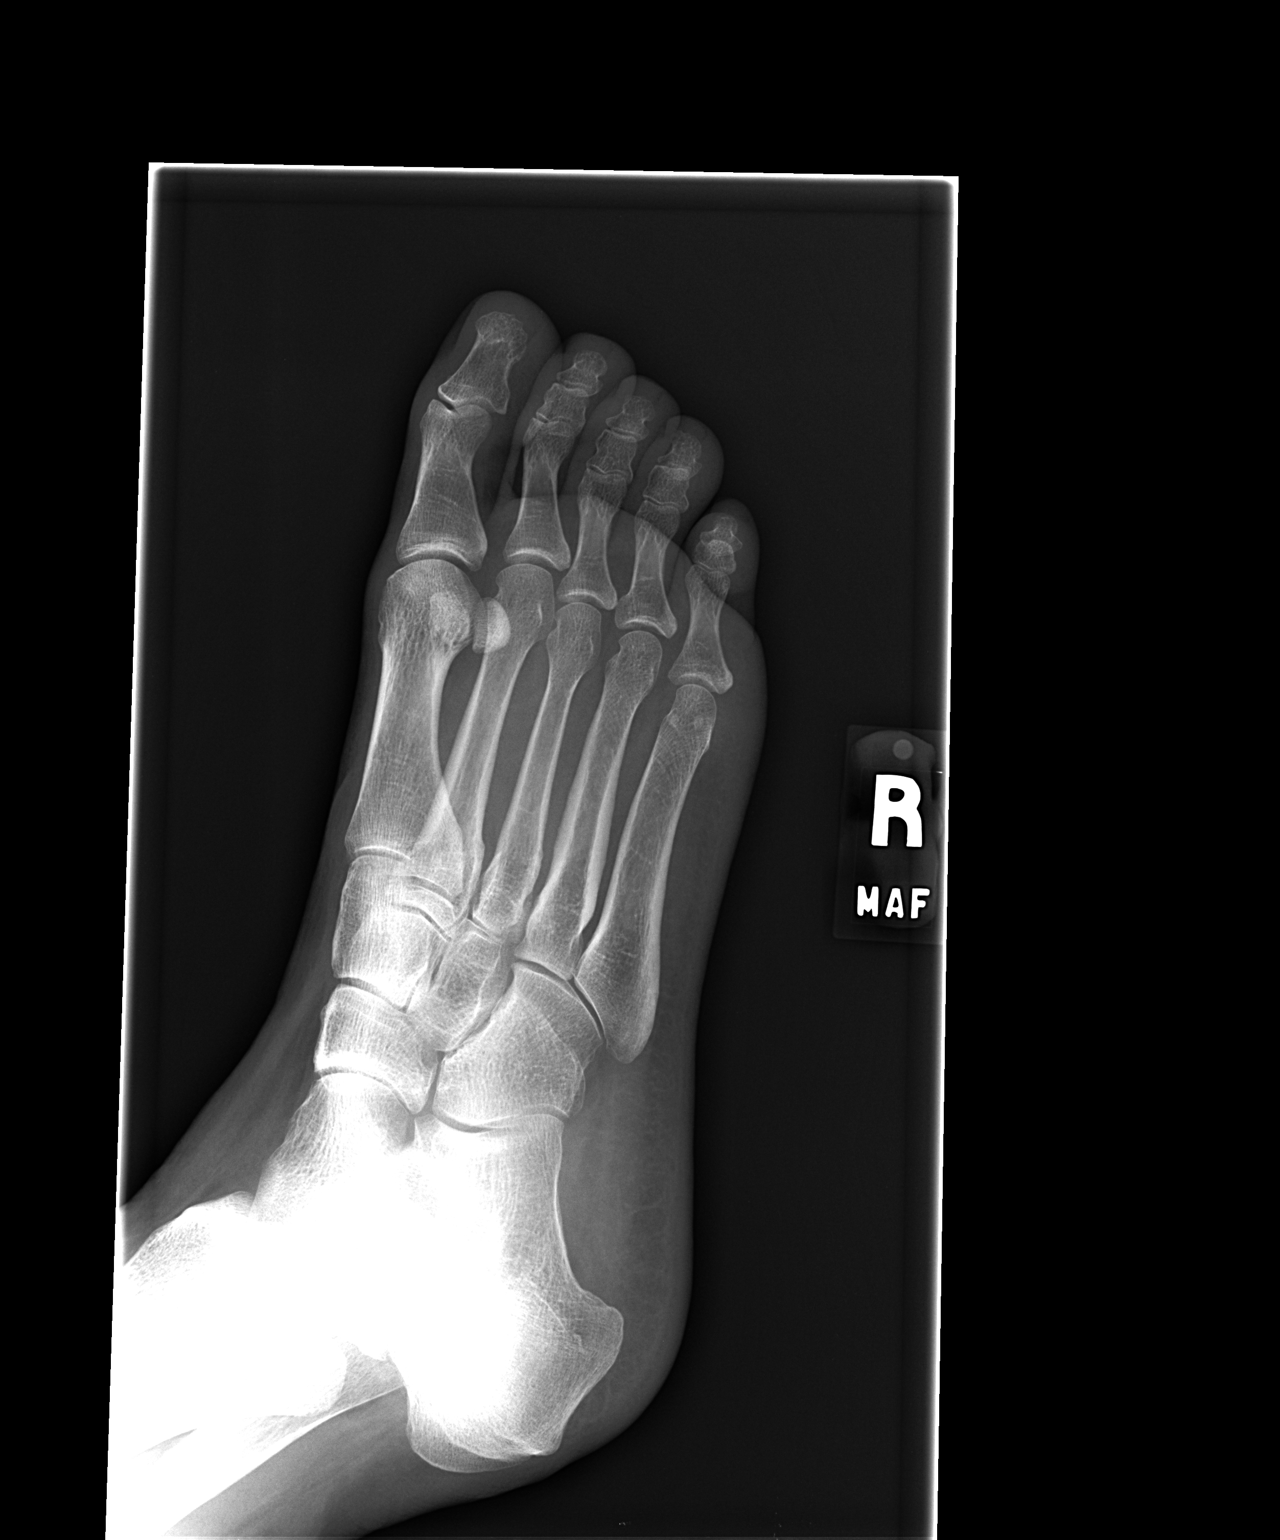

[view not recorded (3 of 3)]
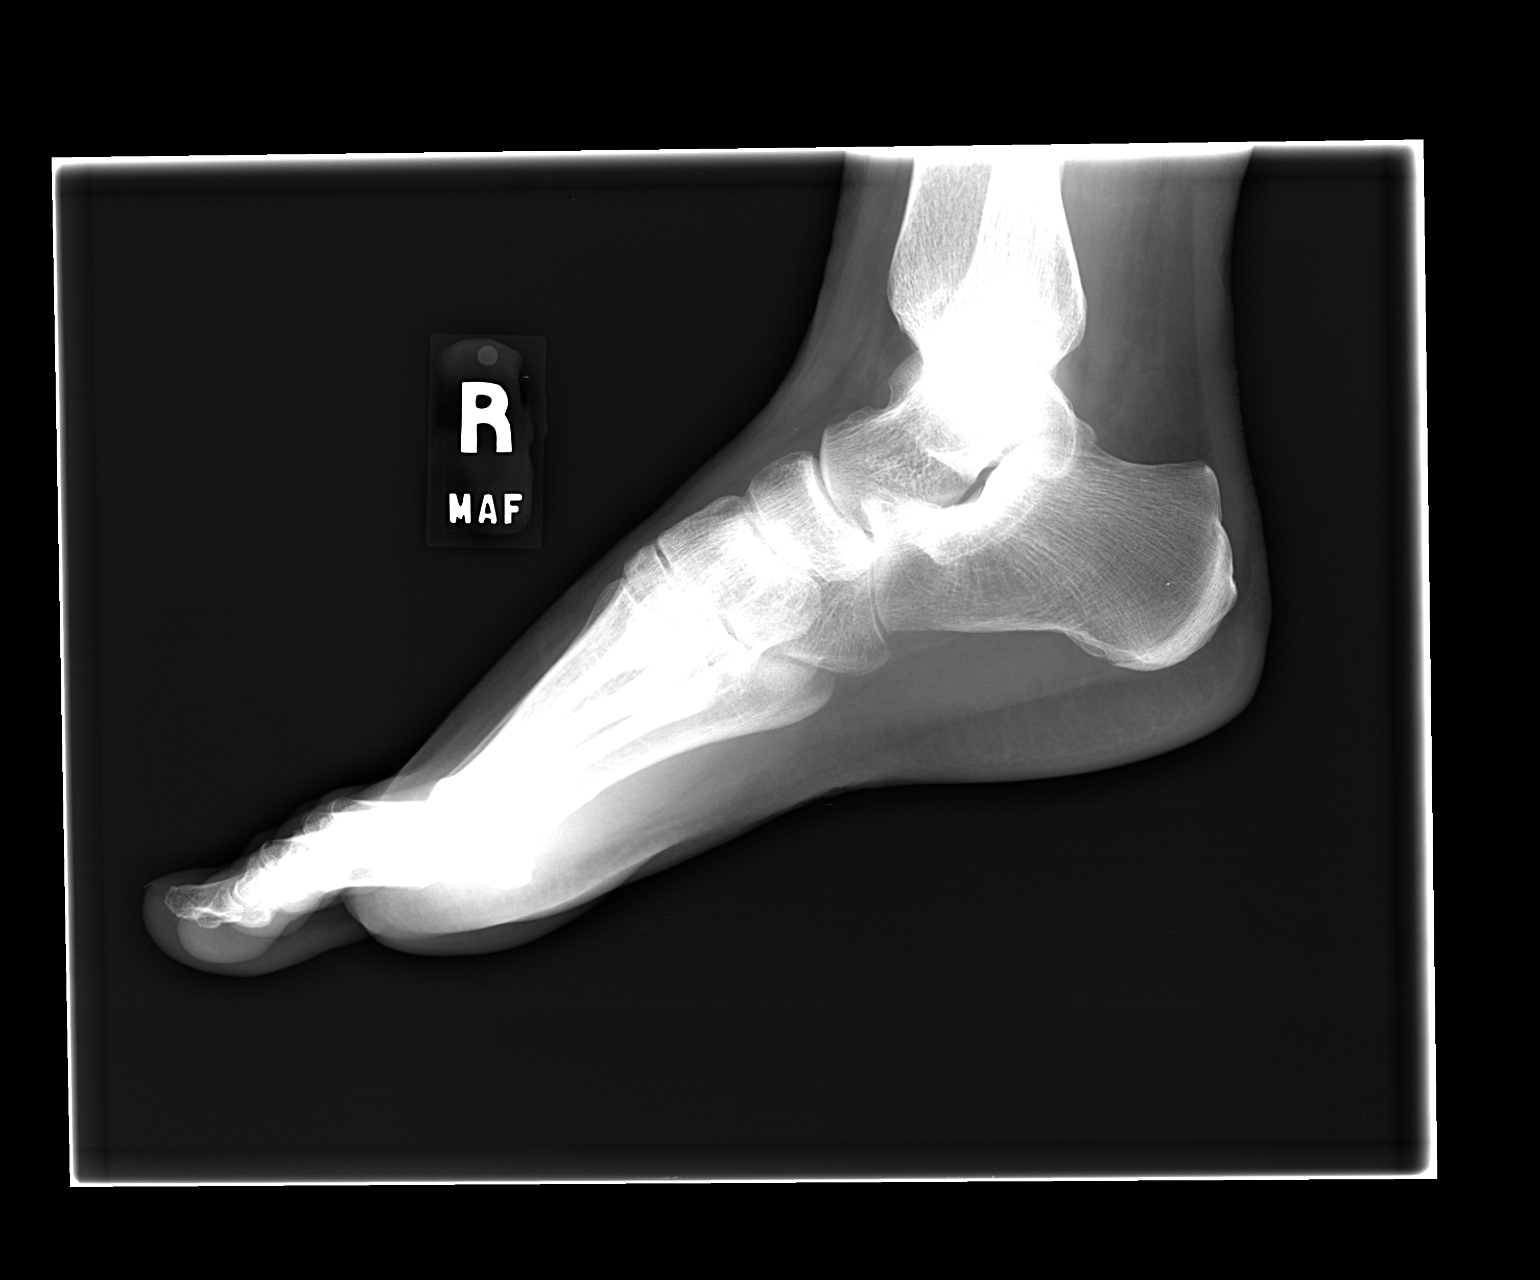

[3 of 3 positions shown; findings below may reference images not displayed]

FINDINGS: There is no evidence of fracture or dislocation.  There
is no evidence of arthropathy or other focal bone abnormality.
Soft tissues are unremarkable.
IMPRESSION: No acute findings.

## 2015-06-13 ENCOUNTER — Emergency Department
Admission: EM | Admit: 2015-06-13 | Discharge: 2015-06-13 | Disposition: A | Discharge status: Home / Self Care | Payer: PRIVATE HEALTH INSURANCE | Source: Home / Self Care | Attending: Family Medicine | Admitting: Family Medicine

## 2015-06-13 DIAGNOSIS — M62838 Other muscle spasm: Secondary | ICD-10-CM

## 2015-06-13 DIAGNOSIS — S46912A Strain of unspecified muscle, fascia and tendon at shoulder and upper arm level, left arm, initial encounter: Secondary | ICD-10-CM

## 2015-06-13 HISTORY — DX: Gastro-esophageal reflux disease without esophagitis: K21.9

## 2015-06-13 MED ORDER — PREDNISONE 20 MG PO TABS: ORAL_TABLET | ORAL | Status: DC

## 2015-06-13 MED ORDER — HYDROCODONE-ACETAMINOPHEN 5-325 MG PO TABS: 1.0000 | ORAL_TABLET | Freq: Four times a day (QID) | ORAL | Status: DC | PRN

## 2015-06-13 MED ORDER — MELOXICAM 7.5 MG PO TABS: 7.5000 mg | ORAL_TABLET | Freq: Every day | ORAL | Status: DC

## 2015-06-13 MED ORDER — KETOROLAC TROMETHAMINE 60 MG/2ML IM SOLN
60.0000 mg | Freq: Once | INTRAMUSCULAR | Status: AC
  Administered 2015-06-13: 60 mg via INTRAMUSCULAR

## 2015-06-13 MED ORDER — DEXAMETHASONE SODIUM PHOSPHATE 10 MG/ML IJ SOLN
10.0000 mg | Freq: Once | INTRAMUSCULAR | Status: AC
  Administered 2015-06-13: 10 mg via INTRAMUSCULAR

## 2015-06-13 NOTE — ED Notes (Signed)
Worked out Tuesday, Woke up Wednesday morning with sharp pain in left shoulder that has progressively become worse.  If sitting still pain is a 3 if any movement it is an 8.  She has pain when moving neck, shoulder, and flexion.  Has had 2 massages, Wednesday and Friday, both helped but only for a little while.  Took 1/2 flexeril last night, still in pain.

## 2015-06-13 NOTE — ED Provider Notes (Signed)
CSN: [id number]     Arrival date & time 06/13/15  4098 History   First MD Initiated Contact with Patient 06/13/15 [phone]     Chief Complaint  Patient presents with  . Shoulder Pain    Left   (Consider location/radiation/quality/duration/timing/severity/associated sxs/prior Treatment) HPI  Pt is a 54yo female presenting to [facility] with c/o Left shoulder pain and Left upper back pain that started about 4 days ago.  She reports working out more recently and lifting weights but does not recall a specific injury. She woke Wednesday morning with the pain, which has gradually worsened. Pain is aching and sharp, worse with palpation over her scapula and certain movements. She had 2 massages, both helped temporarily.  She took 1/2 a flexeril last night which helped her sleep but pain is still present.  Denies neck pain. Denies prior shoulder injuries or surgeries.    Past Medical History  Diagnosis Date  . Thyroid disease   . Depression   . Psoriatic arthritis (HCC)   . Hyperlipidemia   . GERD (gastroesophageal reflux disease)    Past Surgical History  Procedure Laterality Date  . Cesarean section    . Exploratory laparotomy    . Tonsillectomy    . Anal fissure repair    . Abdominal hysterectomy     Family History  Problem Relation Age of Onset  . Cancer Father   . Thyroid disease Father   . Thyroid disease Sister   . Cancer Brother    Social History  Substance Use Topics  . Smoking status: Never Smoker   . Smokeless tobacco: Not on file  . Alcohol Use: Yes     Comment: rarely   OB History    No data available     Review of Systems  Musculoskeletal: Positive for myalgias, back pain and arthralgias. Negative for joint swelling, gait problem, neck pain and neck stiffness.       Left shoulder and upper back  Skin: Negative for color change and wound.  Neurological: Negative for weakness and numbness.    Allergies  Sulfonamide derivatives  Home Medications   Prior to Admission  medications   Medication Sig Start Date End Date Taking? Authorizing Provider  ARIPiprazole (ABILIFY) 2 MG tablet Take 2 mg by mouth daily.    Historical Provider, MD  chlorpheniramine-HYDROcodone (TUSSIONEX PENNKINETIC ER) 10-8 MG/5ML LQCR Take 5 mLs by mouth at bedtime as needed. 06/19/11   Lattie Haw, MD  HYDROcodone-acetaminophen (NORCO/VICODIN) 5-325 MG tablet Take 1 tablet by mouth every 6 (six) hours as needed for moderate pain or severe pain. 06/13/15   Junius Finner, PA-C  lamoTRIgine (LAMICTAL) 150 MG tablet Take 150 mg by mouth daily.    Historical Provider, MD  levothyroxine (SYNTHROID, LEVOTHROID) 88 MCG tablet Take 88 mcg by mouth daily.    Historical Provider, MD  meloxicam (MOBIC) 7.5 MG tablet Take 1 tablet (7.5 mg total) by mouth daily. Take 1 tab daily with breakfast for 5 days, then daily as needed for pain 06/13/15   Junius Finner, PA-C  predniSONE (DELTASONE) 20 MG tablet 2 tabs po daily x 3 days 06/13/15   Junius Finner, PA-C  rosuvastatin (CRESTOR) 20 MG tablet Take 20 mg by mouth daily.    Historical Provider, MD  sertraline (ZOLOFT) 100 MG tablet Take 100 mg by mouth daily.    Historical Provider, MD   Meds Ordered and Administered this Visit   Medications  ketorolac (TORADOL) injection 60 mg (60 mg Intramuscular Given  06/13/15 1016)  dexamethasone (DECADRON) injection 10 mg (10 mg Intramuscular Given 06/13/15 1017)    BP 117/87 mmHg  Pulse 87  Temp(Src) 98.2 F (36.8 C) (Oral)  Ht  (1.549 m)  Wt 152 lb 4 oz (69.06 kg)  BMI 28.78 kg/m2  SpO2 97%  LMP  No data found.   Physical Exam  Constitutional: She is oriented to person, place, and time. She appears well-developed and well-nourished.  HENT:  Head: Normocephalic and atraumatic.  Eyes: EOM are normal.  Neck: Normal range of motion. Neck supple.  No midline bone tenderness, no crepitus or step-offs.   Cardiovascular: Normal rate.   Pulses:      Radial pulses are 2+ on the left side.   Pulmonary/Chest: Effort normal. No respiratory distress.  Musculoskeletal: Normal range of motion. She exhibits tenderness. She exhibits no edema.  Left shoulder: no deformity. Full ROM with increased pain at extremes of motions.  Tenderness to Left upper trapezius with palpable muscle spasm.  Mild tenderness to anterior shoulder and deltoid.  Left elbow: full ROM, non-tender. No midline spinal tenderness.  Neurological: She is alert and oriented to person, place, and time.  Left arm: normal sensation  Skin: Skin is warm and dry.  Psychiatric: She has a normal mood and affect. Her behavior is normal.  Nursing note and vitals reviewed.   ED Course  Procedures (including critical care time)  Labs Review Labs Reviewed - No data to display  Imaging Review No results found.    MDM   1. Left shoulder strain, initial encounter   2. Muscle spasm of left shoulder     Pt c/o Left shoulder pain after working out earlier this week. Palpable muscle spasm on exam. Full ROM of shoulder w/o hx of direct trauma to shoulder. No imaging indicated at this time.  Tx in UC: Decadron  IM and Toradol  IM  Rx: Norco (tramadol interacts with pt's Zoloft), prednisone  for 3 days, and mobic  Home care instructions provided. F/u with Sports Medicine, Dr. Denyse Amass or Dr. Benjamin Stain in 1-2 weeks if not improving, sooner if worsening. Discouraged heavy lifting for at least 1 week. Patient verbalized understanding and agreement with treatment plan.      Junius Finner, PA-C 06/13/15 1646

## 2015-06-13 NOTE — ED Notes (Signed)
Patient discharged with RX and instructions verbalizes an understanding.

## 2015-06-13 NOTE — Discharge Instructions (Signed)
Norco/Vicodin (hydrocodone-acetaminophen) is a narcotic pain medication, do not combine these medications with others containing tylenol. While taking, do not drink alcohol, drive, or perform any other activities that requires focus while taking these medications.  ° °Meloxicam (Mobic) is an antiinflammatory to help with pain and inflammation.  Do not take ibuprofen, Advil, Aleve, or any other medications that contain NSAIDs while taking meloxicam as this may cause stomach upset or even ulcers if taken in large amounts for an extended period of time.  ° ° °

## 2016-04-10 ENCOUNTER — Emergency Department
Admission: EM | Admit: 2016-04-10 | Discharge: 2016-04-10 | Disposition: A | Discharge status: Home / Self Care | Payer: PRIVATE HEALTH INSURANCE | Source: Home / Self Care | Attending: Emergency Medicine | Admitting: Emergency Medicine

## 2016-04-10 ENCOUNTER — Encounter: Payer: Self-pay | Admitting: Emergency Medicine

## 2016-04-10 DIAGNOSIS — J40 Bronchitis, not specified as acute or chronic: Secondary | ICD-10-CM | POA: Diagnosis not present

## 2016-04-10 DIAGNOSIS — R062 Wheezing: Secondary | ICD-10-CM | POA: Diagnosis not present

## 2016-04-10 MED ORDER — AZITHROMYCIN 250 MG PO TABS: ORAL_TABLET | ORAL | 0 refills | Status: DC

## 2016-04-10 MED ORDER — METHYLPREDNISOLONE 4 MG PO TBPK: ORAL_TABLET | ORAL | 0 refills | Status: DC

## 2016-04-10 MED ORDER — LEVALBUTEROL HCL 0.63 MG/3ML IN NEBU: 0.6300 mg | INHALATION_SOLUTION | Freq: Four times a day (QID) | RESPIRATORY_TRACT | 1 refills | Status: DC | PRN

## 2016-04-10 NOTE — ED Triage Notes (Signed)
Patient has been congestion and coughing for more than one week; some wheezing; extreme fatigue (dealing with recent death of mother).

## 2016-04-11 NOTE — ED Provider Notes (Signed)
[facility]    CSN: [id number] Arrival date & time: 04/10/16  1741   Sunday   History   Chief Complaint Chief Complaint  Patient presents with  . Cough  . Nasal Congestion  . Wheezing    HPI Kathleen Soto is a 55 y.o. female.   HPI URI HISTORY  Kathleen Soto is a 55 y.o. female who complains of cough and cold symptoms for 10 days, progressively worsening for the past 5 days, including cough productive of discolored sputum and mild wheezing. She states she has a history of exercise-induced asthma and occasional wheezing if she gets a URI or bronchitis. Has tried OTC treatment, but has not tried any nebulizer or other wheezing treatment. She states she has a nebulizer at home and requests a refill of Xopenex to use when necessary.   Also, her mother died this past week, which has caused stress. She complains of fatigue with this illness, but no syncope or focal neurologic symptoms.  No chills/sweats + Low-grade Fever  +  Nasal congestion +  Minimal Discolored Post-nasal drainage No sinus pain/pressure No sore throat  +  cough Positive, wheezing Positive, chest congestion No hemoptysis No shortness of breath No pleuritic pain  No itchy/red eyes No earache  No nausea No vomiting No abdominal pain No diarrhea  No skin rashes +  Fatigue No myalgias No headache   Past Medical History:  Diagnosis Date  . Depression   . GERD (gastroesophageal reflux disease)   . Hyperlipidemia   . Psoriatic arthritis (HCC)   . Thyroid disease     Patient Active Problem List   Diagnosis Date Noted  . HYPERLIPIDEMIA 12/26/2009  . ANXIETY 12/26/2009  . DEPRESSION 12/26/2009  . FOOT PAIN, RIGHT 12/26/2009  . OSTEOPENIA 12/26/2009  . FOOT SPRAIN, RIGHT 12/26/2009  . GERD 12/08/2006    Past Surgical History:  Procedure Laterality Date  . ABDOMINAL HYSTERECTOMY    . ANAL FISSURE REPAIR    . CESAREAN SECTION    . EXPLORATORY LAPAROTOMY    . TONSILLECTOMY        OB History    No data available       Home Medications    Prior to Admission medications   Medication Sig Start Date End Date Taking? Authorizing Provider  ezetimibe (ZETIA) 10 MG tablet Take 10 mg by mouth daily.   Yes Historical Provider, MD  ARIPiprazole (ABILIFY) 2 MG tablet Take 2 mg by mouth daily.    Historical Provider, MD  azithromycin (ZITHROMAX Z-PAK) 250 MG tablet Take 2 tablets on day one, then 1 tablet daily on days 2 through 5 04/10/16   Lajean Manesavid Massey, MD  HYDROcodone-acetaminophen (NORCO/VICODIN) 5-325 MG tablet Take 1 tablet by mouth every 6 (six) hours as needed for moderate pain or severe pain. 06/13/15   Junius FinnerErin O'Malley, PA-C  lamoTRIgine (LAMICTAL) 150 MG tablet Take 150 mg by mouth daily.    Historical Provider, MD  levalbuterol Pauline Aus(XOPENEX) 0.63 MG/3ML nebulizer solution Take 3 mLs (0.63 mg total) by nebulization every 6 (six) hours as needed for wheezing or shortness of breath. 04/10/16 04/15/16  Lajean Manesavid Massey, MD  levothyroxine (SYNTHROID, LEVOTHROID) 88 MCG tablet Take 88 mcg by mouth daily.    Historical Provider, MD  meloxicam (MOBIC) 7.5 MG tablet Take 1 tablet (7.5 mg total) by mouth daily. Take 1 tab daily with breakfast for 5 days, then daily as needed for pain 06/13/15   Junius FinnerErin O'Malley, PA-C  methylPREDNISolone (MEDROL DOSEPAK) 4 MG TBPK  tablet Take as directed for 6 days 04/10/16   Lajean Manesavid Massey, MD  sertraline (ZOLOFT) 100 MG tablet Take 100 mg by mouth daily.    Historical Provider, MD    Family History Family History  Problem Relation Age of Onset  . Thyroid disease Sister   . Cancer Father   . Thyroid disease Father   . Cancer Brother     Social History Social History  Substance Use Topics  . Smoking status: Never Smoker  . Smokeless tobacco: Never Used  . Alcohol use Yes     Comment: rarely     Allergies   Sulfonamide derivatives She states that prednisone in the past may have worsened GERD, but is taken Medrol in the past without  problems  Review of Systems Review of Systems  All other systems reviewed and are negative.  see also history of present illness  Physical Exam Triage Vital Signs ED Triage Vitals  Enc Vitals Group     BP 04/10/16 1754 130/81     Pulse Rate 04/10/16 1754 84     Resp 04/10/16 1754 16     Temp 04/10/16 1754 98.2 F (36.8 C)     Temp Source 04/10/16 1754 Oral     SpO2 04/10/16 1754 97 %     Weight 04/10/16 1755 149 lb (67.6 kg)     Height 04/10/16 1755 5\' 1"  (1.549 m)     Head Circumference --      Peak Flow --      Pain Score 04/10/16 1901 0     Pain Loc --      Pain Edu? --      Excl. in GC? --    No data found.   Updated Vital Signs BP 130/81 (BP Location: Left Arm)   Pulse 84   Temp 98.2 F (36.8 C) (Oral)   Resp 16   Ht 5\' 1"  (1.549 m)   Wt 149 lb (67.6 kg)   SpO2 97%   BMI 28.15 kg/m    Physical Exam  Constitutional: She is oriented to person, place, and time. She appears well-developed and well-nourished. No distress.  HENT:  Head: Normocephalic and atraumatic.  Right Ear: Tympanic membrane normal.  Left Ear: Tympanic membrane normal.  Nose: Nose normal.  Mouth/Throat: Oropharynx is clear and moist. No oropharyngeal exudate.  Eyes: Right eye exhibits no discharge. Left eye exhibits no discharge. No scleral icterus.  Neck: Neck supple. No JVD present. No tracheal deviation present.  Cardiovascular: Normal rate, regular rhythm and normal heart sounds.   Pulmonary/Chest: No stridor. No respiratory distress. She has wheezes (Mild bilateral, late expiratory). She has rhonchi. She has no rales.  Musculoskeletal: She exhibits no edema or tenderness.  Lymphadenopathy:    She has no cervical adenopathy.  Neurological: She is alert and oriented to person, place, and time.  Skin: Skin is warm and dry. Capillary refill takes less than 2 seconds. No rash noted.  Psychiatric: She has a normal mood and affect.  Nursing note and vitals reviewed.    UC Treatments /  Results  Labs (all labs ordered are listed, but only abnormal results are displayed) Labs Reviewed - No data to display  EKG  EKG Interpretation None       Radiology No results found.  Procedures Procedures (including critical care time)  Medications Ordered in UC Medications - No data to display   Initial Impression / Assessment and Plan / UC Course  I have reviewed the triage  vital signs and the nursing notes.  Pertinent labs & imaging results that were available during my care of the patient were reviewed by me and considered in my medical decision making (see chart for details).  Clinical Course     Acute bronchitis with bronchospasm. Breath sounds equal, no rales, and pulse ox normal. Clinically, no evidence of pneumonia. She declined chest x-ray or any other testing. She declined nebulizer treatment here in urgent care today.  Final Clinical Impressions(s) / UC Diagnoses   Final diagnoses:  Bronchitis  Wheezing   Treatment options discussed, as well as risks, benefits, alternatives. Patient voiced understanding and agreement with the following plans: Z-Pak Refill for Xopenex for home nebulizer prn wheezing. Medrol 6 day Dosepak. I also suggested increasing pantoprazole from her regular once a day regimen to twice a day for the next 5 days, as many medicines tend to exacerbate her GERD. Other symptomatic care discussed  Follow-up with your primary care doctor in 5-7 days if not improving, or sooner if symptoms become worse. Precautions discussed. Red flags discussed.--Emergency room if any red flag Questions invited and answered. Patient voiced understanding and agreement.   New Prescriptions Discharge Medication List as of 04/10/2016  7:02 PM    START taking these medications   Details  azithromycin (ZITHROMAX Z-PAK) 250 MG tablet Take 2 tablets on day one, then 1 tablet daily on days 2 through 5, Print    levalbuterol (XOPENEX) 0.63 MG/3ML nebulizer  solution Take 3 mLs (0.63 mg total) by nebulization every 6 (six) hours as needed for wheezing or shortness of breath., Starting Sun 04/10/2016, Until Fri 04/15/2016, Print    methylPREDNISolone (MEDROL DOSEPAK) 4 MG TBPK tablet Take as directed for 6 days, Print       Also, out of work note X 3 days, written at her request   Lajean Manes, MD 04/11/16 1038

## 2018-03-07 DIAGNOSIS — F411 Generalized anxiety disorder: Secondary | ICD-10-CM

## 2018-03-07 DIAGNOSIS — F3342 Major depressive disorder, recurrent, in full remission: Secondary | ICD-10-CM

## 2018-03-16 ENCOUNTER — Encounter

## 2018-03-16 ENCOUNTER — Ambulatory Visit (INDEPENDENT_AMBULATORY_CARE_PROVIDER_SITE_OTHER): Payer: 59 | Admitting: Psychiatry

## 2018-03-16 DIAGNOSIS — F411 Generalized anxiety disorder: Secondary | ICD-10-CM

## 2018-03-16 DIAGNOSIS — Z8659 Personal history of other mental and behavioral disorders: Secondary | ICD-10-CM

## 2018-03-16 DIAGNOSIS — F3342 Major depressive disorder, recurrent, in full remission: Secondary | ICD-10-CM

## 2018-03-16 NOTE — Progress Notes (Signed)
[facility] Counselor/Therapist Progress Note   Patient ID: Kathleen Soto, MRN: [id number]  Date: 03/16/2018  Start time: 2:25p  Stop time: 3:15p Time Spent: 50 min  Treatment Type: Individual  Subjective:  Difficult time of year, season she was in residential treatment for bulimia (age 57) and time of year when mother passed.  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  Has found herself repeatedly rewriting and honing, trying to get the message right until she eventually decided to write an outline and a slide show to help meet the request that it be artistic in some way.  Developed GI distress, possibly in context of this task, which worried her to the point of fearing stomach cancer, would die young, and not be able to finish raising her children.  Eventually took her thoughts captive, had an endoscopy, was reassured by negative results, and was suggested to treat a suspected H. pylori infection.  Since the evidence was sketchy, she did not take the antibiotics or antacids and try to best move on with plans for travel to [place].  Symptoms did abate without treatment, and she very much enjoyed in [employer] concert in [place].  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  Essentially, has appreciated his intelligence, sensitivity, guidance as a Retail banker, knows he used to be an attorney, but recently became aware that he was disbarred for embezzlement and learned from a dear friend what sounds to be dismissive treatment for her.  Struggling with what to think of him and what to think of herself for being judgmental of him.   Interventions: Solution-Oriented/Positive Psychology and Humanistic/Existential Affirmed handling of physical and anxiety sxs, discussed pros/cons of rewriting and  working her thought process (grueling, but clarifying).  Discussed at some length values she applies to the dilemma of what to think of him and how to approach it, encouraging her to look at her options as options more than "should buts".  Mental Status Exam:    Appearance:   Casual and Neat     Behavior:  Appropriate  Motor:  Normal  Speech/Language:   Clear and Coherent  Affect:  Appropriate  Mood:  anxious and dysthymic  Thought process:  intact, worrisome  Thought content:    self-evaluation/self-doubt  Sensory/Perceptual disturbances:    WNL  Orientation:  WNL  Attention:  Good  Concentration:  Good  Memory:  WNL  Fund of knowledge:   Good  Insight:    Good  Judgment:   Good  Impulse Control:  Good     Risk Assessment: Danger to Self:  No Self-injurious Behavior: No Danger to Others: No Duty to Warn:no Physical Aggression / Violence:No  Access to Firearms a concern: No  Gang Involvement:No     Diagnosis:   ICD-10-CM   1. Generalized anxiety disorder F41.1   2. 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 History of borderline personality disorder Z86.59      Plan:  . Options to release worry or seek priest's guidance . Continue to utilize previously learned skills ad lib . Maintain medication, if prescribed, and work faithfully with relevant prescriber(s) . Call the clinic on-call service, present to ER, or call 911 if any life-threatening emergency .  Follow up with me in about a month    Robley Fries, PhD

## 2018-04-20 ENCOUNTER — Ambulatory Visit: Payer: 59 | Admitting: Psychiatry

## 2018-04-20 DIAGNOSIS — F411 Generalized anxiety disorder: Secondary | ICD-10-CM

## 2018-04-20 DIAGNOSIS — Z8659 Personal history of other mental and behavioral disorders: Secondary | ICD-10-CM | POA: Diagnosis not present

## 2018-04-20 DIAGNOSIS — F3342 Major depressive disorder, recurrent, in full remission: Secondary | ICD-10-CM | POA: Diagnosis not present
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
## 2018-05-25 ENCOUNTER — Ambulatory Visit: Payer: 59 | Admitting: Psychiatry

## 2018-05-25 DIAGNOSIS — Z8659 Personal history of other mental and behavioral disorders: Secondary | ICD-10-CM

## 2018-05-25 DIAGNOSIS — F3342 Major depressive disorder, recurrent, in full remission: Secondary | ICD-10-CM

## 2018-05-25 DIAGNOSIS — F411 Generalized anxiety disorder: Secondary | ICD-10-CM

## 2018-05-25 NOTE — Progress Notes (Signed)
Psychotherapy Progress Note -- Marliss Czar, PhD, [facility]  Patient ID: Kathleen Soto     MRN: [id number]     Date: 05/25/2018     Therapy format: Individual psychotherapy Start: 10:18a Stop: 11:10a Time Spent: 52 min Accompanied by: none  Session narrative -- interim history, self-report of stressors and symptoms, applications of prior therapy, status changes, and interventions in session Continues settled about not having to wrestle with her church leader's past.  Working on not judging her own reaction earlier to it.  Encouraged further in releasing it to the powers that be,  letting his current work be his current work, and being similarly graceful to herself for having a legitimate reaction to bad news of a trusted person.  Worry over son Will, in Engineer, petroleum, with world events threatening deployment and a fiancee.  "Bonus" son, close friend of Will's in [employer], already deployed, and hx of a brother who died suspiciously (implied suicide), mother paralyzed from an accident of her own.  Empathic concern for that family.  Youngest son at [employer], renewed anxiety about campus (or off-campus) shooting risk.  Politically charged about recent events, and family red-blue divisions have meant some abrasive moments and teasing through holidays.  Reviewed communication choices.  Moral quandary, as often, about whether she is being fair-minded or excessive in her objections to some things.  Exploring theological questions of grace and forgiveness with pastor.  Re. acknowledged anger at God, affirmed graceful enough and strong enough to let her feel as she feels, no offense taken by the timeless and almighty.  Therapeutic modalities: Cognitive Behavioral Therapy and faith-sensitive  Mental Status/Observations:  Appearance:   Casual and Neat     Behavior:  Appropriate  Motor:  Normal  Speech/Language:   Clear and Coherent  Affect:  Appropriate  Mood:  anxious  Thought  process:  normal  Thought content:    WNL  Sensory/Perceptual disturbances:    WNL  Orientation:  WNL  Attention:  Good  Concentration:  Good  Memory:  WNL  Insight:    Good  Judgment:   Good  Impulse Control:  Good   Risk Assessment: Danger to Self:  No Self-injurious Behavior: No Danger to Others: No Duty to Warn:no Physical Aggression / Violence:No  Access to Firearms a concern: No   Diagnosis:   ICD-10-CM   1. Generalized anxiety disorder F41.1   2. 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 Continue self-forgiveness, journaling ad lib . Continue to utilize previously learned skills ad lib . Maintain medication, if prescribed, and work faithfully with relevant prescriber(s) . Call the clinic on-call service, present to ER, or call 911 if any life-threatening emergency . Follow up with me in about 2-4 wks  Robley Fries, PhD  Licensed Psychologist

## 2018-06-01 ENCOUNTER — Ambulatory Visit: Payer: 59 | Admitting: Psychiatry

## 2018-06-01 ENCOUNTER — Encounter: Payer: Self-pay | Admitting: Psychiatry

## 2018-06-01 VITALS — BP 148/93 | HR 85

## 2018-06-01 DIAGNOSIS — Z8659 Personal history of other mental and behavioral disorders: Secondary | ICD-10-CM | POA: Diagnosis not present

## 2018-06-01 DIAGNOSIS — F330 Major depressive disorder, recurrent, mild: Secondary | ICD-10-CM | POA: Diagnosis not present

## 2018-06-01 DIAGNOSIS — F411 Generalized anxiety disorder: Secondary | ICD-10-CM | POA: Diagnosis not present

## 2018-06-01 NOTE — Progress Notes (Signed)
[name] [id number] 05/07/1961 58 y.o.  Subjective:   Patient ID:  Kathleen Soto is a 58 y.o. (DOB 1960/07/03) female.  Chief Complaint:  Chief Complaint  Patient presents with  . Follow-up    h/o Depression, Anxiety    HPI Kathleen Soto presents to the office today for follow-up of depression and anxiety. She reports multiple stressors in October and November. She reports that she had some severe GI s/s to include abdominal pain. Reports this triggered anxiety that she may have stomach cancer since she knew 2 people that had stomach cancer. Reports that test was positive for h.pylori. Reports that November is always a difficut month since her mother died the day after her birthday.Reports that holidays are also difficult for her. She reports that she had a lengthy upper respiratory illness over the holidays. Reports that illness caused her to get off her exercise routine and this affected her mood. Also had exacerbation of chronic hives with issues with her medications. Has had anxiety with concerns that son who is in the [employer] could be deployed with current events. "That whole issue is weighing on me" and feeling "out of control." Reports that she had a brief incident where she had eating disordered thoughts. She reports that overall she reports that her body image has improved and minimal obsessive thoughts about eating and exercise. Appetite has been ok. Reports some sad mood "but not really depression" and more of a decrease in motivation with not exercising regularly, being sick, and dealing with stressors. Energy has also been lower. She reports concentration has been ok and that work is busy so she has to stay focused. Denies SI.   Review of Systems:  Review of Systems  Musculoskeletal: Negative for gait problem.  Neurological: Negative for tremors.       Reports benign essential tremor has improved  Psychiatric/Behavioral:       Please refer to HPI    Medications:  I have reviewed the patient's current medications.  Current Outpatient Medications  Medication Sig Dispense Refill  . buPROPion (WELLBUTRIN XL) 300 MG 24 hr tablet Take 300 mg by mouth every morning.    . desloratadine (CLARINEX) 5 MG tablet Take 5 mg by mouth daily.    Marland Kitchen ezetimibe (ZETIA) 10 MG tablet Take 10 mg by mouth daily.    . Famotidine (PEPCID PO) Take by mouth 2 (two) times daily.    Marland Kitchen lamoTRIgine (LAMICTAL) 100 MG tablet Take 100 mg by mouth daily.     Marland Kitchen levalbuterol (XOPENEX HFA) 45 MCG/ACT inhaler Inhale into the lungs every 4 (four) hours as needed for wheezing.    Marland Kitchen levocetirizine (XYZAL) 5 MG tablet Take 5 mg by mouth every evening.    . Levothyroxine Sodium 100 MCG CAPS Take 100 mcg by mouth daily.     . methocarbamol (ROBAXIN) 500 MG tablet Take 500 mg by mouth every 6 (six) hours as needed for muscle spasms.    . montelukast (SINGULAIR) 10 MG tablet Take 10 mg by mouth at bedtime.    . Omalizumab (XOLAIR [place]) Inject into the skin.    . pantoprazole (PROTONIX) 40 MG tablet Take 40 mg by mouth daily.    Marland Kitchen levalbuterol (XOPENEX) 0.63 MG/3ML nebulizer solution Take 3 mLs (0.63 mg total) by nebulization every 6 (six) hours as needed for wheezing or shortness of breath. 30 mL 1   No current facility-administered medications for this visit.     Medication Side Effects: None  Allergies:  Allergies  Allergen Reactions  . Prednisone     Esophageal spasms  . Sulfonamide Derivatives     REACTION: mouth ulcers    Past Medical History:  Diagnosis Date  . Depression   . GERD (gastroesophageal reflux disease)   . Hyperlipidemia   . Thyroid disease   . Urticaria, chronic     Family History  Problem Relation Age of Onset  . Thyroid disease Sister   . Cancer Father   . Thyroid disease Father   . Depression Father   . Cancer Brother   . Parkinson's disease Mother   . Alcohol abuse Paternal Grandfather   . Alcohol abuse Sister     Social History   Socioeconomic  History  . Marital status: Single    Spouse name: Not on file  . Number of children: Not on file  . Years of education: Not on file  . Highest education level: Not on file  Occupational History  . Not on file  Social Needs  . Financial resource strain: Not on file  . Food insecurity:    Worry: Not on file    Inability: Not on file  . Transportation needs:    Medical: Not on file    Non-medical: Not on file  Tobacco Use  . Smoking status: Never Smoker  . Smokeless tobacco: Never Used  Substance and Sexual Activity  . Alcohol use: Yes    Comment: rarely  . Drug use: No  . Sexual activity: Not on file  Lifestyle  . Physical activity:    Days per week: Not on file    Minutes per session: Not on file  . Stress: Not on file  Relationships  . Social connections:    Talks on phone: Not on file    Gets together: Not on file    Attends religious service: Not on file    Active member of club or organization: Not on file    Attends meetings of clubs or organizations: Not on file    Relationship status: Not on file  . Intimate partner violence:    Fear of current or ex partner: Not on file    Emotionally abused: Not on file    Physically abused: Not on file    Forced sexual activity: Not on file  Other Topics Concern  . Not on file  Social History Narrative  . Not on file    Past Medical History, Surgical history, Social history, and Family history were reviewed and updated as appropriate.   Please see review of systems for further details on the patient's review from today.   Objective:   Physical Exam:  BP (!) 148/93   Pulse 85   Physical Exam Constitutional:      General: She is not in acute distress.    Appearance: She is well-developed.  Musculoskeletal:        General: No deformity.  Neurological:     Mental Status: She is alert and oriented to person, place, and time.     Coordination: Coordination normal.  Psychiatric:        Mood and Affect: Mood is not  anxious or depressed. Affect is not labile, blunt, angry or inappropriate.        Speech: Speech normal.        Behavior: Behavior normal.        Thought Content: Thought content normal. Thought content does not include homicidal or suicidal ideation. Thought content does not include homicidal  or suicidal plan.        Judgment: Judgment normal.     Comments: Insight intact. No auditory or visual hallucinations. No delusions.      Lab Review:  No results found for: NA, K, CL, CO2, GLUCOSE, BUN, CREATININE, CALCIUM, PROT, ALBUMIN, AST, ALT, ALKPHOS, BILITOT, GFRNONAA, GFRAA  No results found for: WBC, RBC, HGB, HCT, PLT, MCV, MCH, MCHC, RDW, LYMPHSABS, MONOABS, EOSABS, BASOSABS  No results found for: POCLITH, LITHIUM   No results found for: PHENYTOIN, PHENOBARB, VALPROATE, CBMZ   .res Assessment: Plan:   Will continue Wellbutrin XL to 300 po q am for depression. Continue Lamictal 100 mg po qd for depression.  Discussed continuing current medications at this time since overall mood and anxiety s/s have been well controlled and she suspects some recent worsening in mood and anxiety were related to stressors and will improve with resuming exercise and other routines.  Recommend continuing therapy with Marliss CzarAndy Mitchum, LPC.    Generalized anxiety disorder  Mild episode of recurrent major depressive disorder (HCC)  History of bulimia nervosa  Please see After Visit Summary for patient specific instructions.  Future Appointments  Date Time Provider Department Center  07/06/2018 10:00 AM Robley FriesMitchum, Robert, PhD [facility] None  08/31/2018  9:30 AM Corie Chiquitoarter, Harmonii Karle, PMHNP [facility] None    No orders of the defined types were placed in this encounter.     -------------------------------

## 2018-06-08 ENCOUNTER — Telehealth: Payer: Self-pay | Admitting: Psychiatry

## 2018-06-08 DIAGNOSIS — F3342 Major depressive disorder, recurrent, in full remission: Secondary | ICD-10-CM

## 2018-06-08 MED ORDER — LAMOTRIGINE 100 MG PO TABS: 150.0000 mg | ORAL_TABLET | Freq: Every day | ORAL | 0 refills | Status: DC

## 2018-06-08 NOTE — Addendum Note (Signed)
Addended by: Derenda Mis on: 06/08/2018 11:15 AM   Modules accepted: Orders

## 2018-06-08 NOTE — Telephone Encounter (Signed)
Kathleen Soto called wanting to discuss the medication change you mentioned at last visit. Thinks she may want to do this.  Please call to discuss.  (She does not have a f/u appt but is b/c we CA her appt 4/17 b/c you are out of the office that day.  So we need to Rs her)

## 2018-06-08 NOTE — Telephone Encounter (Signed)
Returned call to pt. Pt reports "I'm feeling a little more depressed and little more anxious." Reports worsening depression with low energy and low motivation. "Then I get anxious because I am not getting things done."  Currently having some loose stools and queasiness.   Discussed risks, benefits, and side effects of tx options to include Trintellix, Viibryd, and increase in Lamictal.   Agreed to try increase in Lamictal to 150 mg po qd.   On wait list to see Marliss Czar, PhD sooner if possible and has apt to see him next week.   Will f/u with medical provider re: GI s/s.   Will schedule sooner f/u apt with this provider in approximately 1 month.

## 2018-06-13 ENCOUNTER — Ambulatory Visit: Payer: 59 | Admitting: Psychiatry

## 2018-06-13 DIAGNOSIS — F4010 Social phobia, unspecified: Secondary | ICD-10-CM

## 2018-06-13 DIAGNOSIS — Z8659 Personal history of other mental and behavioral disorders: Secondary | ICD-10-CM

## 2018-06-13 DIAGNOSIS — F411 Generalized anxiety disorder: Secondary | ICD-10-CM

## 2018-06-13 DIAGNOSIS — Z630 Problems in relationship with spouse or partner: Secondary | ICD-10-CM | POA: Diagnosis not present

## 2018-06-13 DIAGNOSIS — F330 Major depressive disorder, recurrent, mild: Secondary | ICD-10-CM

## 2018-06-13 NOTE — Progress Notes (Signed)
Psychotherapy Progress Note [facility]  Patient ID: DANNAN ESPINAL     MRN: [id number]      Therapy format: Individual psychotherapy Date: 06/13/2018     Start: 8:12a Stop: 9:01a Time Spent: 49 min  Session narrative -- presenting needs, interim history, self-report of stressors and symptoms, applications of prior therapy, status changes, and interventions in session Struggling a bit with husband's knack for interrupting moments with phone calls.   Scheduled early because feels herself sliding into depression.  Had Christmas virus, fell out of exercise habit, holiday hubbub all created a backlog of responsibilities and fractured self-care.  Saw Corie Chiquito, offered med adjustment but wanted to work her self-care program first.  Accepted small increase Lamictal since.  Recalls good work for 6 months in couple-focused treatment during recovery from her eating disorder.  Residual anxiety and embarrassment potential for having been a spokespatient for the program, knowing her image is out there about it.  Recently contacted (in the fall) about being interviewed by a journalist for same, and now about to be published.  Has requested pseudonym.  With [place] crisis and risk of Will being deployed now, and [employer], feeling more exposed and vulnerable.  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    "The issue is, I can't go to God for myself."  Priestly advice to pray anyway, including with her "lamentations", but hard to do.  Discussed at some length what constitutes "good" prayer, clarified values and advocated for a very familiar, approachable conception of God while attempting to pray.  Also advocated for praying openly about the struggle to pray, and the experience of feeling like her  prayer is not good enough, etc., practicing being unafraid to ask anything.  Recounts unexpectedly compassionate conversation with [name], kind of dragged out of her by him, but different from the headstrong, irritable kinds of moments she has come to expect, honestly listening and validating of her.  Affirmed and encouraged.  Therapeutic modalities: Dialectical Behavioral Therapy, Humanistic/Existential, Narrative and faith-sensitive  Mental Status/Observations:  Appearance:   Casual     Behavior:  Appropriate  Motor:  Normal  Speech/Language:   Clear and Coherent  Affect:  Appropriate and anxious/shamed  Mood:  anxious and depressed  Thought process:  worrisome  Thought content:    worries  Sensory/Perceptual disturbances:    WNL  Orientation:  WNL  Attention:  Good  Concentration:  Good  Memory:  WNL  Insight:    Good  Judgment:   Good  Impulse Control:  Fair   Risk Assessment: Danger to Self:  No Self-injurious Behavior: No Danger to Others: No Duty to Warn:no Physical Aggression / Violence:No  Access to Firearms a concern: No   Diagnosis:   ICD-10-CM   1. Mild episode of recurrent major depressive disorder (HCC) F33.0   2. Generalized anxiety disorder F41.1   3. Relationship problem between partners Z63.0   4. Social anxiety disorder F40.10   5. History of bulimia nervosa Z86.59     Assessment of progress:  stable  Plan:  Marland Kitchen Apply prayer insights and advice for expressive freedom . Continue noticing and countering perfectionistic attitudes and thoughts about her spiritual practices . Seek to reward husband for shows of compassion and listening . Practice surrender re. Paediatric nurse while  honoring the fact that she can't help but feel anxiously concerned as a mother . Other recommendations/advice as noted above . Continue to utilize previously learned skills ad lib . Maintain medication as prescribed and work faithfully with relevant prescriber(s) if any  changes are desired or seem indicated . Call the clinic on-call service, present to ER, or call 911 if any life-threatening emergency Return for as scheduled already. (about a month)   Robley Fries, PhD [facility] Licensed Psychologist

## 2018-06-23 ENCOUNTER — Other Ambulatory Visit: Payer: Self-pay | Admitting: Psychiatry

## 2018-06-23 DIAGNOSIS — F3342 Major depressive disorder, recurrent, in full remission: Secondary | ICD-10-CM

## 2018-06-24 ENCOUNTER — Other Ambulatory Visit: Payer: Self-pay | Admitting: Psychiatry

## 2018-07-06 ENCOUNTER — Ambulatory Visit: Payer: 59 | Admitting: Psychiatry

## 2018-07-06 DIAGNOSIS — F4010 Social phobia, unspecified: Secondary | ICD-10-CM | POA: Diagnosis not present

## 2018-07-06 DIAGNOSIS — Z630 Problems in relationship with spouse or partner: Secondary | ICD-10-CM

## 2018-07-06 DIAGNOSIS — F411 Generalized anxiety disorder: Secondary | ICD-10-CM | POA: Diagnosis not present

## 2018-07-06 DIAGNOSIS — F330 Major depressive disorder, recurrent, mild: Secondary | ICD-10-CM

## 2018-07-06 DIAGNOSIS — Z8659 Personal history of other mental and behavioral disorders: Secondary | ICD-10-CM

## 2018-07-06 NOTE — Progress Notes (Signed)
Psychotherapy Progress Note [facility] Marliss Czar, [name]  Patient ID: AVION CHISM     MRN: [id number]     Therapy format: Individual psychotherapy Date: 07/06/2018     Start: 10:11a Stop: 11:02a Time Spent: 51 min  Session narrative -- presenting needs, interim history, self-report of stressors and symptoms, applications of prior therapy, status changes, and interventions made in session Mood improving, tentatively says he might be "happy", credited to spiritual experience of confession, namely that she had missed and squandered opportunity and gifts and that she had b.s.'d her last meeting with pastor.  Email sent, with wait of a couple days that spiked obsessive doubts about whether she had embarrassed herself, offended priest, etc.  Eventually got back an offer to undergo a private rite called Reconciliation of a Penitent, an Episcopalian parallel to Catholic confession.    Realizes she was turning down in mood from late summer till midwinter over anxieties about son being deployed, mother passing, spiritual biography for church, and being contacted for publicity about her experience in an [facility] program years back, much of which revived old reflexes to obligation and shame while reviving self-judgment and renewing a sense of shorting herself on assertiveness.  Also having to admit complete powerlessness over son's safety.  Greatly relieved by the rite, restored sense of God's grace and her good standing, better able to tolerate anxieties about son, more accepting of her own social exposure agreeing to interview.  Has just taken on a spiritual director, Molinda Bailiff, of the [facility] in [place].  Positive initial connection.  Husband also doing a weight-loss regimen and on Vyvanse for ADHD -- uncomfortably flat feeling for him but much easier to work with, nonreactive, not pestering.  Of note that husband seems to have heightened sense of disgust with himself right now,  having lost enough weight (40#) to see how far he had let himself go and be powerfully disgusted, afflicted with dysmorphic thinking much like what PT used to have in her own [facility], and in fact attending the [facility] clinic that benefitted her.  Therapeutic modalities: Dialectical Behavioral Therapy, Humanistic/Existential, Narrative and faith-sensitive  Mental Status/Observations:  Appearance:   Casual and Neat     Behavior:  Appropriate  Motor:  Normal  Speech/Language:   Clear and Coherent  Affect:  Appropriate  Mood:  improving, more euthymic/content  Thought process:  normal  Thought content:    less worrisome and self-doubting/self-denigrating  Sensory/Perceptual disturbances:    WNL  Orientation:  WNL  Attention:  Good  Concentration:  Good  Memory:  WNL  Insight:    Good  Judgment:   Good  Impulse Control:  Good   Risk Assessment: Danger to Self:  No Self-injurious Behavior: No Danger to Others: No Duty to Warn:no Physical Aggression / Violence:No  Access to Firearms a concern: No   Diagnosis:   ICD-10-CM   1. Mild episode of recurrent major depressive disorder (HCC) F33.0   2. Generalized anxiety disorder F41.1   3. Relationship problem between partners Z63.0    improving  4. History of bulimia nervosa Z86.59     Assessment of progress:  improving  Plan:  . Continue spiritual direction and development as desired -- obviously helpful and on-target . Advice responding to husband's struggles with compassionate confrontation as needed, and privately reassure herself if told anything asinine ("You don't understand what this is like") in the midst of his own vulnerability and self-consciousness . Continue to utilize previously  learned skills ad lib . Maintain medication as prescribed and work faithfully with relevant prescriber(s) if any changes are desired or seem indicated . Call the clinic on-call service, present to ER, or call 911 if any life-threatening emergency Return  in about 4 weeks (around 08/03/2018).   Robley Fries, PhD [place] Licensed Psychologist

## 2018-08-03 ENCOUNTER — Ambulatory Visit: Payer: 59 | Admitting: Psychiatry

## 2018-08-03 ENCOUNTER — Other Ambulatory Visit: Payer: Self-pay

## 2018-08-03 DIAGNOSIS — F4010 Social phobia, unspecified: Secondary | ICD-10-CM | POA: Diagnosis not present

## 2018-08-03 DIAGNOSIS — Z8659 Personal history of other mental and behavioral disorders: Secondary | ICD-10-CM | POA: Diagnosis not present

## 2018-08-03 DIAGNOSIS — Z630 Problems in relationship with spouse or partner: Secondary | ICD-10-CM | POA: Diagnosis not present

## 2018-08-03 DIAGNOSIS — F411 Generalized anxiety disorder: Secondary | ICD-10-CM

## 2018-08-03 NOTE — Progress Notes (Signed)
Psychotherapy Progress Note [facility] Marliss Czar, [name]  Patient ID: Kathleen Soto     MRN: [id number]     Therapy format: Individual psychotherapy Date: 08/03/2018     Start: 8:17a Stop: 9:07a Time Spent: 50 min  Session narrative -- presenting needs, interim history, self-report of stressors and symptoms, applications of prior therapy, status changes, and interventions made in session  General stress up with COVID concerns, moving targets and holes in understandings at her clinic about precautions, knowing most of their clientele tend to be both risky and ignorant, admin minimizing concerns.of staff, patronizing interactions, frustrations gearing up for remote treatment procedures, and general atmosphere of tension.  Meanwhile, husband reacting (supportively, but intensely) to news of frustrations.  Continues to work at restraining urgent reactions.  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  Meanwhile, husband keeps shopping for food and stocking up, possibly to binge proportions ("dry drunk", as she says, for his binge eating disorder).  Have had clarifying conversations trying to respect each other's coping.  Struggling with her own "emotionality" and her personal makeup (enneagram type 4 -- helper, romantic, searching for significance, spiritually minded), striking the balance between "integrating" (objective, principled) and "disintegrating" (clingy, emotionally splattering) and the blessing/curses of strong empathy and feeling unique.  Arouses worry at times about the old BPD diagnosis.  Enneagram study helping self-validate she's not pathological, just continuing to learn how to modulate.  Recent added grief of learning that a favorite, deeply  meaningful inn in [place] is going up for sale.  Social distancing conditions and financial concerns have also dampened the appeal of getting away, mch as she feels the need to get away from the home they are in.  Thoughtful note to the owners.  Church remains good solace and support, even in remote mode.  Resolving she needs to proceed in hope with the house, "as if" it will happen, and that she needs to stay oriented to support her husband on his own time of stress and transition.  Problem-solved re. fears of husband using his new therapy (DBT, to start soon in [place]) against her ("Don't DBT me", as he said back in the day when she was in the program).  Oriented to the all-purpose assertive question, "So what do you need from me right now?", spoken authentically.  For struggling with her own emotionality, encouraged she ask herself, "OK, so right now I ...  And?", also spoken authentically and compassionately.  Agrees.  Therapeutic modalities: Cognitive Behavioral Therapy, Humanistic/Existential and faith-sensitive  Mental Status/Observations:  Appearance:   Casual     Behavior:  Appropriate  Motor:  Normal  Speech/Language:   Clear and Coherent  Affect:  Appropriate  Mood:  anxious, sad and moderately, appropriate to topics  Thought process:  normal  Thought content:    WNL  Sensory/Perceptual disturbances:    WNL  Orientation:  WNL  Attention:  Good  Concentration:  Good  Memory:  WNL  Insight:    Good  Judgment:   Good  Impulse Control:  Good   Risk Assessment: Danger to Self:  No Self-injurious Behavior: No Danger to Others: No Duty to Warn:no Physical Aggression / Violence:No  Access to Firearms a concern: No   Diagnosis:  ICD-10-CM   1. Generalized anxiety disorder F41.1   2. Relationship problem between partners Z63.0   3. Social anxiety disorder F40.10   4. History of bulimia nervosa Z86.59     Assessment of progress:  stable  Plan:   . Recommendations/advice as noted above, emphasizing "OK/and what do you need" responses to self and husband . Continue to utilize previously learned skills ad lib . Maintain medication as prescribed and work faithfully with relevant prescriber(s) if any changes are desired or seem indicated . Call the clinic on-call service, present to ER, or call 911 if any life-threatening emergency Return in about 1 month (around 09/03/2018).   Robley Fries, PhD [place] Licensed Psychologist

## 2018-08-31 ENCOUNTER — Ambulatory Visit (INDEPENDENT_AMBULATORY_CARE_PROVIDER_SITE_OTHER): Payer: 59 | Admitting: Psychiatry

## 2018-08-31 ENCOUNTER — Ambulatory Visit: Payer: 59 | Admitting: Psychiatry

## 2018-08-31 ENCOUNTER — Other Ambulatory Visit: Payer: Self-pay

## 2018-08-31 ENCOUNTER — Telehealth: Payer: Self-pay | Admitting: Psychiatry

## 2018-08-31 DIAGNOSIS — Z630 Problems in relationship with spouse or partner: Secondary | ICD-10-CM

## 2018-08-31 DIAGNOSIS — F411 Generalized anxiety disorder: Secondary | ICD-10-CM

## 2018-08-31 DIAGNOSIS — F330 Major depressive disorder, recurrent, mild: Secondary | ICD-10-CM

## 2018-08-31 DIAGNOSIS — F4010 Social phobia, unspecified: Secondary | ICD-10-CM | POA: Diagnosis not present

## 2018-08-31 NOTE — Telephone Encounter (Signed)
Pt needs to talk to someone about her medication. Wants to know if she needs to go up on it.

## 2018-08-31 NOTE — Telephone Encounter (Signed)
Left voicemail to call back with more information on her symptoms.

## 2018-08-31 NOTE — Progress Notes (Signed)
Psychotherapy Progress Note [facility] Marliss Czar, [name]  Patient ID: Kathleen Soto     MRN: [id number]     Therapy format: Individual psychotherapy Date: 08/31/2018     Start: 10:17a Stop: 11:02a Time Spent: 45 min  Telehealth visit I connected with patient by a video enabled telemedicine/telehealth application or telephone, with her informed consent, and verified patient privacy and that I am speaking with the correct person using two identifiers.  I was located at home and patient at home.  We discussed the limitations, risks, and security and privacy concerns associated with telehealth services and the availability of in-person appointments, including awareness that she may be responsible for charges related to the service, and she expressed understanding and agreed to proceed.  I discussed treatment planning with her, with opportunity to ask and answer all questions. Agreed with the plan, demonstrated an understanding of the instructions, and made her aware to call our office if symptoms worsen or she feels she is in a crisis state and needs immediate contact.  Session narrative -- presenting needs, interim history, self-report of stressors and symptoms, applications of prior therapy, status changes, and interventions made in session A little stressed, "like everybody else", but getting used to work from home.  Had in-person shot clinic, noted anxiety going in, figured was a resurgence in social anxiety after the relative relief of being at home.  Had a communication flareup with office staff where a technical idea she emailed in got read as self-important, worked through awkwardness, managed to represent herself without anger or groveling.  Overall, feeling more risk of being misunderstood and judged, but trying to keep it gracious and make short work of explanations, given her tendencies to be fully responsible and fully understood and others getting uneasy.   Encouraged in being quick enough to see when the process of explaining or apologizing has diminishing returns, and letting minor misunderstandings slide where able.  Finding the need to call certain coworkers due to them persistently overinterpreting things in writing.  Appreciates relief from the commute to work.  Making habit of walking in evenings with husband.  Husband losing weight successfully, battling diabetes, found out he has one kidney.  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  Home building on hold under current circumstances.  Refocusing on keeping house in cleaner-living condition.  Relationship going very well as [name] has "become "Mr. House Husband", bringing her meals and motivating the kids.  "Apocalyptic" refrigerator and pantry from husband shopping, but they have been able to repurpose the surplus, help neighbors.    Glad to have established with her church -- very active with caregiving, checking on each other, finds devotional offerings and online services meaningful and uplifting, in addition to freshening up perspective and opportunity to thoughtful service and prayer for others.  Holy Week and [place] services particularly meaningful for alternative process this year and the deeper meaning.  Overall, sees depression as "tamped down", not gone.  Cognizant, not worried about it.  Would like to increase Lamictal to cope, will consult prescriber.  Therapeutic modalities: Cognitive Behavioral Therapy, Assertiveness/Communication and Solution-Oriented/Positive Psychology  Mental Status/Observations:  Appearance:   Casual and a little worn     Behavior:  Appropriate  Motor:  Normal  Speech/Language:   Clear and Coherent  Affect:  Appropriate and somewhat down  Mood:  anxious and dysthymic  Thought process:  normal  Thought content:    worries, esp. interpersonal  Sensory/Perceptual disturbances:     WNL  Orientation:  grossly intact  Attention:  Good  Concentration:  Good  Memory:  WNL  Insight:    Good  Judgment:   Good  Impulse Control:  Good   Risk Assessment: Danger to Self:  No Self-injurious Behavior: No Danger to Others: No Duty to Warn:no Physical Aggression / Violence:No  Access to Firearms a concern: No   Diagnosis:   ICD-10-CM   1. Generalized anxiety disorder F41.1   2. Relationship problem between partners Z63.0   3. Social anxiety disorder F40.10   4. Mild episode of recurrent major depressive disorder (HCC) F33.0    Assessment of progress:  stable  Plan:  . Encouraged to continue in more discretionary use of written communication, voice over text with sensitized audiences . Try to get more explicit openness to ideas and questions before fronting them, since certain people around her are sensitized to her approach . Try to refrain from asking reassurances where possible, for same reason . Maintain soothing activities like walking, devotions . Other recommendations/advice as noted above . Continue to utilize previously learned skills ad lib . Maintain medication as prescribed and work faithfully with relevant prescriber(s) if any changes are desired or seem indicated . Call the clinic on-call service, present to ER, or call 911 if any life-threatening psychiatric crisis Return in about 1 month (around 09/30/2018) for will call.   Robley Friesobert Annaleigh Steinmeyer, PhD [place] Licensed Psychologist

## 2018-09-03 ENCOUNTER — Other Ambulatory Visit: Payer: Self-pay | Admitting: Psychiatry

## 2018-09-03 DIAGNOSIS — F3342 Major depressive disorder, recurrent, in full remission: Secondary | ICD-10-CM

## 2018-09-03 MED ORDER — LAMOTRIGINE 150 MG PO TABS: 150.0000 mg | ORAL_TABLET | Freq: Every day | ORAL | 1 refills | Status: DC

## 2018-09-03 NOTE — Telephone Encounter (Signed)
Script for Lamictal 150 mg qd sent to pt's pharmacy. Please advise pt to contact office immediately if she develops a rash.

## 2018-09-03 NOTE — Telephone Encounter (Signed)
Called and made pt. Aware. She verbalized understanding and will call the office if there are any complications.

## 2018-09-07 ENCOUNTER — Ambulatory Visit: Payer: 59 | Admitting: Psychiatry

## 2018-09-07 ENCOUNTER — Other Ambulatory Visit: Payer: Self-pay

## 2018-09-07 DIAGNOSIS — F411 Generalized anxiety disorder: Secondary | ICD-10-CM | POA: Diagnosis not present

## 2018-09-07 DIAGNOSIS — F330 Major depressive disorder, recurrent, mild: Secondary | ICD-10-CM | POA: Diagnosis not present

## 2018-09-07 DIAGNOSIS — F4010 Social phobia, unspecified: Secondary | ICD-10-CM

## 2018-09-07 DIAGNOSIS — Z8659 Personal history of other mental and behavioral disorders: Secondary | ICD-10-CM | POA: Diagnosis not present

## 2018-09-07 NOTE — Progress Notes (Signed)
Psychotherapy Progress Note [facility] Marliss Czar, [name]  Patient ID: Kathleen Soto     MRN: [id number]     Therapy format: Individual psychotherapy Date: 09/07/2018     Start: 8:05a Stop: 9:13a Time Spent: 68 min  Telehealth visit I connected with patient by a video enabled telemedicine/telehealth application or telephone, with her informed consent, and verified patient privacy and that I am speaking with the correct person using two identifiers.  I was located at my home and patient at her home.  We discussed the limitations, risks, and security and privacy concerns associated with telehealth services and the availability of in-person appointments, including awareness that she may be responsible for charges related to the service, and she expressed understanding and agreed to proceed.  I discussed treatment planning with her, with opportunity to ask and answer all questions. Agreed with the plan, demonstrated an understanding of the instructions, and made her aware to call our office if symptoms worsen or she feels she is in a crisis state and needs immediate contact.  Session narrative -- presenting needs, interim history, self-report of stressors and symptoms, applications of prior therapy, status changes, and interventions made in session Dysphoric of late, rescheduled early following changes to management and requirements with her work, diverted home to teleservice.  Intimidated by new requirements, objected that she would not be able to handle it all (email based communication) without having tried it yet, got confronted about "lengthy, panicky emails", spiraled in embarrassment and tension and intensely felt conflict over defending herself vs. having and using discretion.  Getting indications that her superiors believe she "doesn't want to be a team player", when in fact it comes from an acute sense of responsibility.  Structure of teleservice slows down the work she  is used to doing, now complicated by a list of technological questions and variable length of duties that threaten her ability to get it all done.  Meanwhile, remains sensitive to how she is perceived, depends on approval, and as stress hits her boss (remote psychiatrist), boss tends to snap and remind her to do better in some way rather than offer understanding and guidance.  Although painful, has taken helpful points about run-on messages and complexity.  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  Work time has stretched hard, and trouble sleeping.  Now losing weight, not much free to exercise as usual, and sparking fear that her EDO will reignite.  Still feeling need to be heard out better, having to battle worst memories of being judged and having corporate authority turn on her in intangible, unaccountable ways.  Reliving the nightmare of what she saw in previous employment with  conflict-avoidant authority comforting itself by progressively trashing her, the work stress which accelerated/precipitated her psych hospitalization and EDO recovery.  Rather phobic of going back through that, feels too eerie right now.  Support, validation, normalized as natural emotional-brain reaction but good reasons not to believe it's fully the same, as she is a veteran this time, she is already exercising discretion and beginning to manage communication, etc.  Irritated/intimidated by finding herself waking up crying the other night.  Husband able to be comforting.  Knows the actions ("counseling") will go into her HR file, has asked some about how her actions will be taken, has partial reassurance.  Advocated that she first accept feeling alone in her place with this and emotional reminder of psychological trauma past, then focus on best-odds communication  strategies to exert influence over perceived reputation.  Advised keeping all email messages circumscribed, minimal self-explanation, maximum efficiency, use phone call when knows audience needs to recognize her good intentions and to demonstrate courage.    Therapeutic modalities: Cognitive Behavioral Therapy, Assertiveness/Communication and Ego-Supportive  Mental Status/Observations:  Appearance:   Casual and more worn     Behavior:  Appropriate  Motor:  Normal  Speech/Language:   Clear and Coherent  Affect:  Appropriate and more downcast  Mood:  anxious and depressed  Thought process:  normal  Thought content:    WNL and possible overvalued ideas re. social acceptance, but WNL  Sensory/Perceptual disturbances:    WNL  Orientation:  grossly intact  Attention:  Good  Concentration:  Good  Memory:  WNL  Insight:    Good  Judgment:   Good  Impulse Control:  Good   Risk Assessment: Danger to Self:  No Self-injurious Behavior: No Danger to Others: No Duty to Warn:no Physical Aggression / Violence:No  Access to Firearms a concern: No   Diagnosis:   ICD-10-CM   1. Social anxiety disorder F40.10   2. Generalized anxiety disorder F41.1   3. Mild episode of recurrent major depressive disorder (HCC) F33.0   4. History of borderline personality disorder Z86.59    Assessment of progress:  stable  Plan:  . Discretionary communication as above . Points made last week (see note) . Other recommendations/advice as noted above . Continue to utilize previously learned skills ad lib . Maintain medication as prescribed and work faithfully with relevant prescriber(s) if any changes are desired or seem indicated . Call the clinic on-call service, present to ER, or call 911 if any life-threatening psychiatric crisis Return in about 2 weeks (around 09/21/2018) for will call, set up as teletherapy session.   Robley Fries, PhD [place] Licensed Psychologist

## 2018-09-14 ENCOUNTER — Encounter: Payer: Self-pay | Admitting: Psychiatry

## 2018-09-14 ENCOUNTER — Other Ambulatory Visit: Payer: Self-pay

## 2018-09-14 ENCOUNTER — Ambulatory Visit: Payer: 59 | Admitting: Psychiatry

## 2018-09-14 DIAGNOSIS — F411 Generalized anxiety disorder: Secondary | ICD-10-CM | POA: Diagnosis not present

## 2018-09-14 DIAGNOSIS — F330 Major depressive disorder, recurrent, mild: Secondary | ICD-10-CM

## 2018-09-14 MED ORDER — BUPROPION HCL ER (XL) 300 MG PO TB24: 300.0000 mg | ORAL_TABLET | Freq: Every morning | ORAL | 0 refills | Status: DC

## 2018-09-14 NOTE — Progress Notes (Signed)
Kathleen Soto [id number] [birth year] 58 y.o.  Virtual Visit via Video Note  I connected with@ on 09/14/18 at  8:30 AM EDT by a video enabled telemedicine application and verified that I am speaking with the correct person using two identifiers.   I discussed the limitations of evaluation and management by telemedicine and the availability of in person appointments. The patient expressed understanding and agreed to proceed.  I discussed the assessment and treatment plan with the patient. The patient was provided an opportunity to ask questions and all were answered. The patient agreed with the plan and demonstrated an understanding of the instructions.   The patient was advised to call back or seek an in-person evaluation if the symptoms worsen or if the condition fails to improve as anticipated.  I provided 30 minutes of non-face-to-face time during this encounter.  The patient was located at home.  The provider was located at home.   Corie Chiquito, PMHNP   Subjective:   Patient ID:  Kathleen Soto is a 58 y.o. (DOB [birth year]) female.  Chief Complaint:  Chief Complaint  Patient presents with  . Anxiety  . Follow-up    h/o depression    HPI GENEE RANN presents to the office today for follow-up of anxiety and depression.  She reports that she started increase in Lamictal about a week ago and is unsure of response. She reports that she is working from home except for one day a week. She reports that she has had some issues with anxiety with increased work demands with pandemic.  She reports that she feels micro-managed at her job and feels scrutinized and has a tendency to make more mistakes when anxious. Reports that she was tearful after receiving negative feedback yesterday. Reports that she was able to advocate for herself during the meeting. Reports that she has been periodically awakening with increased heart rate and tearfulness. Reports some anticipatory anxiety  when she hears an email alert. Reports some dread about upcoming up evaluation. Denies having any full blown panic attacks and describes a more constant "panicky feeling." She reports that she has had decreased appetite and some weight loss and this has started some thoughts about wanting to restrict her intake. She reports some "situational" depression and occ has some self-loathing after receiving negative feed back. She reports feeling "tired" due to working long hours. Reports that her motivation has been good. Reports that her concentration is fair. Denies SI.    Review of Systems:  Review of Systems  Gastrointestinal:       Reports that she has been having some GI s/s with increased anxiety.   Musculoskeletal: Negative for gait problem.  Skin:       Chronic urticartia (baseline)  Neurological: Negative for tremors.  Psychiatric/Behavioral:       Please refer to HPI    Medications: I have reviewed the patient's current medications.  Current Outpatient Medications  Medication Sig Dispense Refill  . buPROPion (WELLBUTRIN XL) 300 MG 24 hr tablet Take 1 tablet (300 mg total) by mouth every morning. 90 tablet 0  . desloratadine (CLARINEX) 5 MG tablet Take 5 mg by mouth daily.    Marland Kitchen ezetimibe (ZETIA) 10 MG tablet Take 10 mg by mouth daily.    . Famotidine (PEPCID PO) Take by mouth 2 (two) times daily.    Marland Kitchen lamoTRIgine (LAMICTAL) 150 MG tablet Take 1 tablet (150 mg total) by mouth daily. 30 tablet 1  . levalbuterol (XOPENEX HFA) 45 MCG/ACT  inhaler Inhale into the lungs every 4 (four) hours as needed for wheezing.    Marland Kitchen. levocetirizine (XYZAL) 5 MG tablet Take 5 mg by mouth every evening.    . Levothyroxine Sodium 100 MCG CAPS Take 100 mcg by mouth daily.     . methocarbamol (ROBAXIN) 500 MG tablet Take 500 mg by mouth every 6 (six) hours as needed for muscle spasms.    . montelukast (SINGULAIR) 10 MG tablet Take 10 mg by mouth at bedtime.    . Omalizumab (XOLAIR [place]) Inject into the skin.     . pantoprazole (PROTONIX) 40 MG tablet Take 40 mg by mouth daily.    Marland Kitchen. levalbuterol (XOPENEX) 0.63 MG/3ML nebulizer solution Take 3 mLs (0.63 mg total) by nebulization every 6 (six) hours as needed for wheezing or shortness of breath. 30 mL 1   No current facility-administered medications for this visit.     Medication Side Effects: Other: Decreased libido  Allergies:  Allergies  Allergen Reactions  . Prednisone     Esophageal spasms  . Sulfonamide Derivatives     REACTION: mouth ulcers    Past Medical History:  Diagnosis Date  . Depression   . GERD (gastroesophageal reflux disease)   . Hyperlipidemia   . Thyroid disease   . Urticaria, chronic     Family History  Problem Relation Age of Onset  . Thyroid disease Sister   . Cancer Father   . Thyroid disease Father   . Depression Father   . Cancer Brother   . Parkinson's disease Mother   . Alcohol abuse Paternal Grandfather   . Alcohol abuse Sister     Social History   Socioeconomic History  . Marital status: Single    Spouse name: Not on file  . Number of children: Not on file  . Years of education: Not on file  . Highest education level: Not on file  Occupational History  . Not on file  Social Needs  . Financial resource strain: Not on file  . Food insecurity:    Worry: Not on file    Inability: Not on file  . Transportation needs:    Medical: Not on file    Non-medical: Not on file  Tobacco Use  . Smoking status: Never Smoker  . Smokeless tobacco: Never Used  Substance and Sexual Activity  . Alcohol use: Yes    Comment: rarely  . Drug use: No  . Sexual activity: Not on file  Lifestyle  . Physical activity:    Days per week: Not on file    Minutes per session: Not on file  . Stress: Not on file  Relationships  . Social connections:    Talks on phone: Not on file    Gets together: Not on file    Attends religious service: Not on file    Active member of club or organization: Not on file     Attends meetings of clubs or organizations: Not on file    Relationship status: Not on file  . Intimate partner violence:    Fear of current or ex partner: Not on file    Emotionally abused: Not on file    Physically abused: Not on file    Forced sexual activity: Not on file  Other Topics Concern  . Not on file  Social History Narrative  . Not on file    Past Medical History, Surgical history, Social history, and Family history were reviewed and updated as appropriate.  Please see review of systems for further details on the patient's review from today.   Objective:   Physical Exam:  There were no vitals taken for this visit.  Physical Exam Neurological:     Mental Status: She is alert and oriented to person, place, and time.     Cranial Nerves: No dysarthria.  Psychiatric:        Attention and Perception: Attention normal.        Mood and Affect: Mood is anxious. Mood is not depressed.        Speech: Speech normal.        Behavior: Behavior is cooperative.        Thought Content: Thought content normal. Thought content is not paranoid or delusional. Thought content does not include homicidal or suicidal ideation. Thought content does not include homicidal or suicidal plan.        Cognition and Memory: Cognition and memory normal.        Judgment: Judgment normal.     Lab Review:  No results found for: NA, K, CL, CO2, GLUCOSE, BUN, CREATININE, CALCIUM, PROT, ALBUMIN, AST, ALT, ALKPHOS, BILITOT, GFRNONAA, GFRAA  No results found for: WBC, RBC, HGB, HCT, PLT, MCV, MCH, MCHC, RDW, LYMPHSABS, MONOABS, EOSABS, BASOSABS  No results found for: POCLITH, LITHIUM   No results found for: PHENYTOIN, PHENOBARB, VALPROATE, CBMZ   .res Assessment: Plan:   Discussed treatment options with patient and she reports that she would prefer not to make any medication changes at this time and instead would like to allow more time to see response to recent increase in Lamictal to 150 mg  daily. Will continue Wellbutrin XL 300 mg daily for depression. Discussed option of resuming sertraline, even short-term, if she continues to experience increased anxiety.  Patient reports that she will contact office if anxiety does not improve. Recommend continuing psychotherapy with Jim Desanctis, PhD. Patient to follow-up with this provider in 2 months or sooner if clinically indicated. Patient advised to contact office with any questions, adverse effects, or acute worsening in signs and symptoms.  Generalized anxiety disorder  Mild episode of recurrent major depressive disorder (HCC) - Plan: buPROPion (WELLBUTRIN XL) 300 MG 24 hr tablet  Please see After Visit Summary for patient specific instructions.  Future Appointments  Date Time Provider Department Center  09/28/2018 10:00 AM Robley Fries, PhD [facility] None  11/23/2018  9:30 AM Corie Chiquito, PMHNP [facility] None    No orders of the defined types were placed in this encounter.     -------------------------------

## 2018-09-19 ENCOUNTER — Other Ambulatory Visit: Payer: Self-pay | Admitting: Psychiatry

## 2018-09-19 DIAGNOSIS — F330 Major depressive disorder, recurrent, mild: Secondary | ICD-10-CM

## 2018-09-28 ENCOUNTER — Ambulatory Visit (INDEPENDENT_AMBULATORY_CARE_PROVIDER_SITE_OTHER): Payer: 59 | Admitting: Psychiatry

## 2018-09-28 ENCOUNTER — Other Ambulatory Visit: Payer: Self-pay

## 2018-09-28 ENCOUNTER — Ambulatory Visit: Payer: 59 | Admitting: Psychiatry

## 2018-09-28 DIAGNOSIS — F411 Generalized anxiety disorder: Secondary | ICD-10-CM | POA: Diagnosis not present

## 2018-09-28 DIAGNOSIS — Z630 Problems in relationship with spouse or partner: Secondary | ICD-10-CM | POA: Diagnosis not present

## 2018-09-28 DIAGNOSIS — F330 Major depressive disorder, recurrent, mild: Secondary | ICD-10-CM | POA: Diagnosis not present

## 2018-09-28 DIAGNOSIS — F4010 Social phobia, unspecified: Secondary | ICD-10-CM

## 2018-09-28 NOTE — Progress Notes (Signed)
Psychotherapy Progress Note [facility] Marliss Czar, [name]  Patient ID: Kathleen Soto     MRN: [id number]     Therapy format: Individual psychotherapy Date: 09/28/2018     Start: 10:02a Stop: 10:52a Time Spent: 50 min  Telehealth visit I connected with patient by a video enabled telemedicine/telehealth application or telephone, with her informed consent, and verified patient privacy and that I am speaking with the correct person using two identifiers.  I was located at my home and patient at her home.  We discussed the limitations, risks, and security and privacy concerns associated with telehealth services and the availability of in-person appointments, including awareness that she may be responsible for charges related to the service, and she expressed understanding and agreed to proceed.  I discussed treatment planning with her, with opportunity to ask and answer all questions. Agreed with the plan, demonstrated an understanding of the instructions, and made her aware to call our office if symptoms worsen or she feels she is in a crisis state and needs immediate contact.  Session narrative (presenting needs, interim history, self-report of stressors and symptoms, applications of prior therapy, status changes, and interventions made in session) Has been informed of testing authorization, will try to do Friday June 5.    Re. her standing with supervisors, has had conference calls (3) about her work behavior plan.  Frustrating, because feedback was that "nothing had changed", even though she has been practicing minimal verbage, has maintained an orientation of being a good steward of their attention and time, has asked for examples and made mention of her visual learning style.  Has used positive feedback ("Thank you for noticing"), emotional honesty ("This is stressful", without decrying it as unfair), team approach ("The stress affects my ability to do the job with  patients, keep information accurate"), and asking behavioral specificity and promptness wherever possible.  Having not heard much of any feedback, trying to assume the best.  Has had frustrating time with the support staff supervisor [place], who has been Engineer, manufacturing systems and insisting copy to many people on one level, and on another one keeps ignoring or misunderstanding questions.  Succeeded in keeping it businesslike, support staff mgr wound up getting dinged herself for overincorporating other people.  Has been able to ask, without urgency, for specifications on how they will tell improvement, and able to laugh a bit with them about her foibles, which signals maturity and ability to self-soothe rather than become the needy, uncomfortable presence both she and they might fear.   Acknowledges benefit of work from home and husband's support.  Says their relationship is going swimmingly right now.  Also getting some validation in hearing husband become alert to household irritations that have bothered only her before.  Husband benefiting from Vyvanse, though his coworkers seem to think he has changed too much (they miss his goofy, ADHD self).  He has been a great deal less angry and more thoughtful.    Return to in-person service informed consent Discussed with patient the outlook for return to in-person service, including likely sanitation and risk prevention procedures, universal masking policy, bilateral responsibility to minimize risk of exposure elsewhere before sharing air space together, her own responsibility to gauge both her risk contracting the disease and her risk of transmitting it, the potential need to check her own health indicators and vouch for her own low risk of transmitting the virus before arriving, the potential psychological challenge of adjusting to changed appearances (e.g., masks),  the current outlook for 3rd-party payor coverage of continuing telehealth services, and the fact of  bilateral choice, ultimately, over whether to meet in-person or require more remote contact for the sake of limiting virus exposure in either direction.  Patient indicates understanding and agreement.  Therapeutic modalities: Cognitive Behavioral Therapy, Assertiveness/Communication, Solution-Oriented/Positive Psychology, Environmental managergo-Supportive and Interpersonal  Mental Status/Observations:  Appearance:   Casual     Behavior:  Appropriate  Motor:  Normal  Speech/Language:   Clear and Coherent  Affect:  Appropriate and good humor  Mood:  anxious and much better  Thought process:  normal  Thought content:    WNL  Sensory/Perceptual disturbances:    WNL  Orientation:  grossly intact  Attention:  Good  Concentration:  Good  Memory:  WNL  Insight:    Good  Judgment:   Good  Impulse Control:  Good   Risk Assessment: Danger to Self:  No Self-injurious Behavior: No Danger to Others: No Duty to Warn:no Physical Aggression / Violence:No  Access to Firearms a concern: No   Diagnosis:   ICD-10-CM   1. Generalized anxiety disorder F41.1   2. Social anxiety disorder F40.10   3. Mild episode of recurrent major depressive disorder (HCC) F33.0    much improved  4. Relationship problem between partners Z63.0    much better   Assessment of progress:  improving  Plan:  . Continue with business communication tips . Testing appt 6/5 to evaluate learning style, identify learning differences which may guide self-representation and handling of on-the-job training . Other recommendations/advice as noted above . Continue to utilize previously learned skills ad lib . Maintain medication as prescribed and work faithfully with relevant prescriber(s) if any changes are desired or seem indicated . Call the clinic on-call service, present to ER, or call 911 if any life-threatening psychiatric crisis Return in about 2 weeks (around 10/12/2018) for 10am, set up as teletherapy session.   Robley Friesobert Jonthan Leite, PhD [place]  Licensed Psychologist

## 2018-10-12 ENCOUNTER — Ambulatory Visit (INDEPENDENT_AMBULATORY_CARE_PROVIDER_SITE_OTHER): Payer: 59 | Admitting: Psychiatry

## 2018-10-12 ENCOUNTER — Other Ambulatory Visit: Payer: Self-pay

## 2018-10-12 DIAGNOSIS — F411 Generalized anxiety disorder: Secondary | ICD-10-CM | POA: Diagnosis not present

## 2018-10-12 DIAGNOSIS — F4010 Social phobia, unspecified: Secondary | ICD-10-CM | POA: Diagnosis not present

## 2018-10-12 DIAGNOSIS — F330 Major depressive disorder, recurrent, mild: Secondary | ICD-10-CM

## 2018-10-12 NOTE — Progress Notes (Signed)
Psychotherapy Progress Note [facility] Kathleen Czar, [name]  Patient ID: Kathleen Soto     MRN: [id number]     Therapy format: Individual psychotherapy Date: 10/12/2018     Start: 10:14a Stop: 11:14a Time Spent: 60 min  Telehealth visit I connected with patient by a video enabled telemedicine/telehealth application or telephone, with her informed consent, and verified patient privacy and that I am speaking with the correct person using two identifiers.  I was located at my office and patient at her home.  We discussed the limitations, risks, and security and privacy concerns associated with telehealth services and the availability of in-person appointments, including awareness that she may be responsible for charges related to the service, and she expressed understanding and agreed to proceed.  I discussed treatment planning with her, with opportunity to ask and answer all questions. Agreed with the plan, demonstrated an understanding of the instructions, and made her aware to call our office if symptoms worsen or she feels she is in a crisis state and needs immediate contact.  Session narrative (presenting needs, interim history, self-report of stressors and symptoms, applications of prior therapy, status changes, and interventions made in session) Rough week.  Got her evaluation, knew it "wasn't going to be great", turned out to have some glowing parts and some 3s ("exceeds expectations") but still affected by the lesser ratings and oppressed by the feeling that she is, actually, a "good nurse but a bad employee".  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  Turned out to be comrades in noticing "stupidity" around them.  Now, hearing from her supervisor that PT has developed an image as "entitled", over the past 3 years, among people with power.   Management have put word out to all employees to notify if PT emails them, which has served to accomplish a kind of public shaming, can't help but feel tagged as a "problem."  Last couple weeks even messages with Dr. Piedad Soto are now replied with copies to two managers, indicating a either a change in confidence in her or a need to comply with excessive oversight, either one still undermining her confidence and leaving her feeling under painful scrutiny.  Lowest of her scores does mean a disciplinary action, at least a technical verbal warning, and the maddening process of more vagueness about what will actually be required, amid creeping suspicion she has been tagged as unwanted and it's only a matter of time till some justification is found to reject her.  Continues to produce to expectations, continues to experience moments of frustrated working memory but able to recover.  Uncharacteristically, sent a phone note before getting in touch with patient's parent, which created the appearance of offloading her own duty (when she was actually trying to get a medication service connected before psychiatrist left, under time pressure).  Psychiatrist, lectured her in email, supervisors got involved.  Feels loss of support, though some of it admittedly has to do with history of being able to commiserate about human and technological mess in the work process, and finding that that is not the same as a friendship.    In the pain of dealing with being misjudged, has consulted two priests, one of whom has suggested God may be nudging her toward something.  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  Would like to be an  art therapist, but would require too much retraining too late in her career.  Another idea to go to divinity school, but fraught with expense.  Reminded as a Kathleen KnucklesChristian that she is not assured results with others, and her job is to [employer], i.e.,  do what she knows best with those she serves and those she serves with, regardless of whether they take it as intended, and that God of her understanding means to be with her either way.  Therapeutic modalities: Cognitive Behavioral Therapy, Ego-Supportive and faith-sensitive  Mental Status/Observations:  Appearance:   Casual     Behavior:  Appropriate  Motor:  Normal  Speech/Language:   Clear and Coherent  Affect:  saddened, anxious  Mood:  anxious and sad  Thought process:  normal  Thought content:    WNL and wories, especially social and her image  Sensory/Perceptual disturbances:    WNL  Orientation:  grossly intact  Attention:  Good  Concentration:  Good  Memory:  WNL  Insight:    Good  Judgment:   Good  Impulse Control:  Good   Risk Assessment: Danger to Self:  No Self-injurious Behavior: No Danger to Others: No Duty to Warn:no Physical Aggression / Violence:No  Access to Firearms a concern: No   Diagnosis:   ICD-10-CM   1. Generalized anxiety disorder F41.1   2. Social anxiety disorder F40.10   3. Mild episode of recurrent major depressive disorder (HCC) F33.0    Assessment of progress:  more afflicted by circumstances; functioning stably  Plan:  . Keep email to minimum necessary . Ask clarifications for instructions when needed, to continue to show fair respect and effort to conform, even if expectations seem ingenuine or conflicting . Keep trying to do right and let the chips fall, but OK to look into alternative work situations . Resist dwelling on how authorities, especially professionals, should do better about their own communication -- it only amplifies pain, and it risks promoting behavior that feeds the image she does not want . Other recommendations/advice as noted above . Continue to utilize previously learned skills ad lib . Maintain medication as prescribed and work faithfully with relevant prescriber(s) if any changes are desired or seem  indicated . Call the clinic on-call service, present to ER, or call 911 if any life-threatening psychiatric crisis Return for as scheduled already, previously arranged tetsting, will need counselign f/u.   Robley Friesobert Trevontae Lindahl, PhD [place] Licensed Psychologist

## 2018-10-19 ENCOUNTER — Other Ambulatory Visit: Payer: Self-pay

## 2018-10-19 ENCOUNTER — Ambulatory Visit (INDEPENDENT_AMBULATORY_CARE_PROVIDER_SITE_OTHER): Payer: 59 | Admitting: Psychiatry

## 2018-10-19 DIAGNOSIS — F4010 Social phobia, unspecified: Secondary | ICD-10-CM

## 2018-10-19 DIAGNOSIS — F411 Generalized anxiety disorder: Secondary | ICD-10-CM | POA: Diagnosis not present

## 2018-10-19 NOTE — Progress Notes (Signed)
Psychological Testing Session  [facility] Marliss Czar, [name]  Patient ID: Kathleen Soto     MRN: [id number]     Service: Psychological testing Date: 10/19/2018     Start: 3:05p Stop: 5:15p  PT seen for psychological testing this afternoon to address question of memory difficulties experienced at work recently and possible deficits or personal differences which might underlie mistakes made.  2 hours spent in-person, 2 hours scoring and interpretation to be performed outside session.  PT arrived promptly, normal mental status and compliant with current procedures for COVID-19.  Preparations for testing included sanitizing surfaces and all implements beforehand, measures to prevent potential transfer between examiner and subject, universal masking, and availability of hand sanitizer ad lib.    PT notably anxious about her performance while testing testing and performance, though this is considered ecologically valid for assessing her presenting complaint.  Observations do suggest memory inhibition as afunction of situational anxiety, including some errors characterized as interjecting or confabulating, suggesting the possibility that an anxious state prompts both anticipation of information and pressure to act on it before sufficiently error-checking.  More detail available after scoring.  Anticipate communicating results at next scheduled therapy session in 2 weeks.   Robley Fries, PhD Marliss Czar, [name] Clinical Psychologist, [facility] [address] [place], [place] [address] [phone]

## 2018-11-02 ENCOUNTER — Ambulatory Visit: Payer: 59 | Admitting: Psychiatry

## 2018-11-02 ENCOUNTER — Other Ambulatory Visit: Payer: Self-pay

## 2018-11-02 DIAGNOSIS — F4010 Social phobia, unspecified: Secondary | ICD-10-CM | POA: Diagnosis not present

## 2018-11-02 DIAGNOSIS — F330 Major depressive disorder, recurrent, mild: Secondary | ICD-10-CM

## 2018-11-02 DIAGNOSIS — F411 Generalized anxiety disorder: Secondary | ICD-10-CM

## 2018-11-02 DIAGNOSIS — Z8659 Personal history of other mental and behavioral disorders: Secondary | ICD-10-CM | POA: Diagnosis not present

## 2018-11-02 NOTE — Progress Notes (Signed)
Psychotherapy Progress Note [facility] Luan Moore, [name]  Patient ID: Kathleen Soto     MRN: [id number]     Therapy format: Individual psychotherapy Date: 11/02/2018     Start: 10:16a Stop: 11:06a Time Spent: 50 min Location: in-person   Session narrative (presenting needs, interim history, self-report of stressors and symptoms, applications of prior therapy, status changes, and interventions made in session) Did get a verbal warning, action plan delayed to next week (6/24).  Worried, based on history of being forced out of her job by [employer], but at this time is keeping up with requirements.  Has prudently started looking for another job, in case her situation has gone political.  Trying to keep faith with process.  Found a May 14 listing for a job that is remarkably similar to hers, at her facility, to start 6/28, which alarmed her about the possibility of employer shopping out her job without being honest.  Husband urging her to find another job, be rid of the worry.  Appreciates the support, mixed feelings about the idea of just deciding to leave.  Reviewed thinking about others' intentions and actions, reclaiming trust where possible, empowering to have her options ready, and troubleshooting communication with husband.  Will maintain low-pressure approach to workplace communication, try to trust that she is doing right until direted otherwise, live with some ambiguities.  Therapeutic modalities: Cognitive Behavioral Therapy, Solution-Oriented/Positive Psychology, Humanistic/Existential and Ego-Supportive  Mental Status/Observations:  Appearance:   Casual     Behavior:  Appropriate  Motor:  Normal  Speech/Language:   Clear and Coherent  Affect:  Appropriate  Mood:  anxious and dysthymic  Thought process:  normal  Thought content:    rising an falling worry  Sensory/Perceptual disturbances:    WNL  Orientation:  intact  Attention:   Good  Concentration:  Good  Memory:  WNL  Insight:    Good  Judgment:   Good  Impulse Control:  Good   Risk Assessment: Danger to Self:  No Self-injurious Behavior: No Danger to Others: No Duty to Warn:no Physical Aggression / Violence:No  Access to Firearms a concern: No   Diagnosis:   ICD-10-CM   1. Generalized anxiety disorder  F41.1   2. Social anxiety disorder  F40.10   3. Mild episode of recurrent major depressive disorder (HCC)  F33.0   4. History of bulimia nervosa  Z86.59    Assessment of progress:  stabilized  Plan:  . Continue low-pressure approach to workplace communication, hear out the proposal as it comes . Communication tips with husband PRN, does not have to leave or stay, a priori . Other recommendations/advice as noted above . Continue to utilize previously learned skills ad lib . Maintain medication as prescribed and work faithfully with relevant prescriber(s) if any changes are desired or seem indicated . Call the clinic on-call service, present to ER, or call 911 if any life-threatening psychiatric crisis Return in about 1 week (around 11/09/2018) for option teletherapy or in person.   Kathleen Serve, PhD Luan Moore, [name] Clinical Psychologist, [facility] [address], [place], [place] [address] [phone]

## 2018-11-04 ENCOUNTER — Other Ambulatory Visit: Payer: Self-pay | Admitting: Psychiatry

## 2018-11-04 DIAGNOSIS — F330 Major depressive disorder, recurrent, mild: Secondary | ICD-10-CM

## 2018-11-05 ENCOUNTER — Other Ambulatory Visit: Payer: Self-pay | Admitting: Psychiatry

## 2018-11-05 DIAGNOSIS — F330 Major depressive disorder, recurrent, mild: Secondary | ICD-10-CM

## 2018-11-09 ENCOUNTER — Ambulatory Visit: Payer: 59 | Admitting: Psychiatry

## 2018-11-09 ENCOUNTER — Other Ambulatory Visit: Payer: Self-pay

## 2018-11-09 DIAGNOSIS — F330 Major depressive disorder, recurrent, mild: Secondary | ICD-10-CM | POA: Diagnosis not present

## 2018-11-09 DIAGNOSIS — F4010 Social phobia, unspecified: Secondary | ICD-10-CM | POA: Diagnosis not present

## 2018-11-09 DIAGNOSIS — Z8659 Personal history of other mental and behavioral disorders: Secondary | ICD-10-CM

## 2018-11-09 NOTE — Progress Notes (Signed)
Psychotherapy Progress Note [facility] 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  Highly anxiety-provoking conference call to go over performance improvement plan, following a testy response to her request to make it video, has been breaking out in hives.  Strongly tempted to see gloss-overs and aware that clerical staff have been given a "script" how to deal with her messages, and remains feeling scarlet-lettered.  Was able to represent her anxiety without blaming, despite temptations, and frame how she worries and needing information to help combat it.  Focal issue in how PIP text includes a provision to be supervised on things which seem elemental to PT and imply judgment.  Support provided through tearful reaction in session, compounded by the fact that she feels ashamed of crying in therapy for the first time in years, and inconvenienced by doing so while wearing a surgical mask due to COVID.  Validated need to emote as part of affect regulation.  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  Affirmed PT's interpersonal  effectiveness and distress tolerance work lately in listening well, taking turns, representing anxiety and competing thoughts rather than placing blame, and challenged the impulse to judge her feelings and reactions as reaffirming the shaming diagnosis of BPD.  Made clear that all her reactions come from dedication to doing the right thing and getting recognized as competent and well-intentioned, the main hazard being that if she lets herself expect a high level of empathy from others, she is effectively being perfectionistic and demanding with them; instead, she needs to accept that people will not "get" everything about her she wants them to, and as a [place], she needs to hang onto the conviction that God sees, and approves, and she need not be as alone as she feels when people do not see, or validate, or bring forth the empathy she craves.  Advised to sign the PIP and, instead of objecting to things her feelings read into the language, ask specifying questions (e.g., "How do we tell when I have worked through expectations?" and "How do I tell when I have people's full trust back?") to prompt others to compassion and clarity and knowingly or unknowingly challenge their own biases, without setting up a more adversarial atmosphere.  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  Agrees.  Therapeutic modalities: Cognitive Behavioral Therapy, Dialectical Behavioral Therapy, Assertiveness/Communication and faith-sensitive  Mental Status/Observations:  Appearance:   Casual and Neat     Behavior:  Appropriate  Motor:  Normal  Speech/Language:   Clear and Coherent  Affect:  Appropriate, Tearful and responsive to TX  Mood:  anxious and dysthymic  Thought process:  normal  Thought content:    WNL and shame-oriented  Sensory/Perceptual disturbances:    WNL  Orientation:  grossly intact  Attention:  Good  Concentration:  Good   Memory:  WNL  Insight:    Good  Judgment:   Good  Impulse Control:  Good   Risk Assessment: Danger to Self: No Self-injurious Behavior: No Danger to Others: No Physical Aggression / Violence: No Duty to Warn: No Access to Firearms a concern: No  Assessment of progress:  stabilized, needed to emote  Diagnosis:   ICD-10-CM   1. Social anxiety disorder  F40.10   2. Mild episode of recurrent major depressive disorder (HCC)  F33.0   3. History of borderline personality disorder  Z86.59     Plan:  Marland Kitchen Accept PIP and ask specifying questions . Self-affirm good work holding back hostile reactions and representing internal struggle instead of externalizing it or jumping to conclusions . Self-affirm it is possible that she will still be ostracized, but she has represented fairly already, and that "How do I tell" questions and laying off further challenges to how other people behave will get much farther that raising new objections or quibbling about discipline.  Essentially, give the insecure other some room to not feel at risk, and they will be more open-minded for whatever you need them to hear. . Other recommendations/advice as noted above . Continue to utilize previously learned skills ad lib . Maintain medication as prescribed and work faithfully with relevant prescriber(s) if any changes are desired or seem indicated . Call the clinic on-call service, present to ER, or call 911 if any life-threatening psychiatric crisis Return in about 2 weeks (around 11/23/2018).   Blanchie Serve, PhD Luan Moore, [name] Clinical Psychologist, [facility] [address], [place], [place] [address] [phone]

## 2018-11-14 ENCOUNTER — Ambulatory Visit (INDEPENDENT_AMBULATORY_CARE_PROVIDER_SITE_OTHER): Payer: 59 | Admitting: Psychiatry

## 2018-11-14 ENCOUNTER — Other Ambulatory Visit: Payer: Self-pay

## 2018-11-14 DIAGNOSIS — Z8659 Personal history of other mental and behavioral disorders: Secondary | ICD-10-CM

## 2018-11-14 DIAGNOSIS — F411 Generalized anxiety disorder: Secondary | ICD-10-CM | POA: Diagnosis not present

## 2018-11-14 DIAGNOSIS — F4010 Social phobia, unspecified: Secondary | ICD-10-CM

## 2018-11-14 DIAGNOSIS — F330 Major depressive disorder, recurrent, mild: Secondary | ICD-10-CM | POA: Diagnosis not present

## 2018-11-14 NOTE — Progress Notes (Signed)
Psychotherapy Progress Note [facility] Marliss CzarAndy Tierre Gerard, [name]  Patient ID: Kathleen Soto     MRN: [id number]     Therapy format: Individual psychotherapy Date: 11/14/2018     Start: 1:05p Stop: 1:55p Time Spent: 50 min Location: telehealth   Telehealth visit -- I connected with this patient by an approved telecommunication method (video), with her informed consent, and verifying identity and patient privacy.  I was located at my office and patient at her home.  As needed, we discussed the limitations, risks, and security and privacy concerns associated with telehealth service, including the availability and conditions which currently govern in-person appointments and the possibility that 3rd-party payment may not be fully guaranteed and she may be responsible for charges.  After she indicated understanding, we proceeded with the session.  Also discussed treatment planning, as needed, including ongoing verbal agreement with the plan, the opportunity to ask and answer all questions, her demonstrated understanding of instructions, and her readiness to call the office should symptoms worsen or she feels she is in a crisis state and needs more immediate and tangible assistance.  Session narrative (presenting needs, interim history, self-report of stressors and symptoms, applications of prior therapy, status changes, and interventions made in session) Scheduled last week, unexpectedly unable to come to office.  Husband's partner in intensive care transport service revealed loss of taste and smell, then just tested positive, now resc[place].    Re. her corrective action, has had an initial conversation with her assigned mentor [name], found out how nice she is, learned that she herself questioned the referral, and after doing some digging believes PT has stumbled into some unknown kind of politics, and mentor is going to look into it further.  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  Continues to try to make sense of the chill she has seen happen relating to the physician she partner with.  Trying to keep faith that it's not personal, that Dr. Piedad ClimesStiles is under some other gun, and being careful for undisclosed reasons.  Likely true, supported in keeping faith with the process and players in front of her.  Now rethinking about sharing her memory testing results the way she did and whether it was too vulnerable of her.  Resolved to trust the distribution so far.  Now seeking more specificity about the  requirements of her corrective action, which was vaguely stated, and is stating a delay to sign off on the document until it has more measurable goals, both from a nursing perspective and including input from mentor.  Agreed, and tactfully played asking to make sure the two perceived micromanagers get included in it.  Early word to PT that they feel they are already doing the kinds of accommodations mentioned in the memory testing writeup.  Agreed that, for workplace politics, following guidelines is easily in the eye of the beholder.  Has spoken further with her priest about moving into spiritual direction work.  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  Seemed to go well, another blessing and opportunity.  Therapeutic modalities: Cognitive Behavioral Therapy, Assertiveness/Communication and Ego-Supportive  Mental Status/Observations:  Appearance:   Casual     Behavior:  Appropriate  Motor:  Normal  Speech/Language:   Clear and Coherent  Affect:  Appropriate and tense with subject, muted irritation  Mood:  normal  Thought process:  normal and worrisome  Thought content:    worries  Sensory/Perceptual disturbances:    WNL  Orientation:   grossly intact  Attention:  Good  Concentration:  Good  Memory:  WNL  Insight:    Good  Judgment:   Good  Impulse Control:  Good   Risk Assessment: Danger to Self: No Self-injurious Behavior: No Danger to Others: No Physical Aggression / Violence: No Duty to Warn: No Access to Firearms a concern: No  Assessment of progress:  progressing  Diagnosis:   ICD-10-CM   1. Social anxiety disorder  F40.10   2. Mild episode of recurrent major depressive disorder (HCC)  F33.0   3. Generalized anxiety disorder  F41.1   4. History of bulimia nervosa  Z86.59     Plan:  . Stay the course complying with corrective action and mentorship, OK to seek and offer more specifics in plan before signing . Other recommendations/advice as noted above . Continue to utilize previously learned skills ad lib . Maintain medication as prescribed and work faithfully with relevant prescriber(s) if any changes are desired or seem indicated . Call the clinic on-call service, present to ER, or call 911 if any life-threatening psychiatric crisis Return in about 2 weeks (around 11/28/2018).   Blanchie Serve, PhD Luan Moore, [name] Clinical Psychologist, [facility] [address] [place], [place] [address] [phone]

## 2018-11-22 ENCOUNTER — Ambulatory Visit (INDEPENDENT_AMBULATORY_CARE_PROVIDER_SITE_OTHER): Payer: 59 | Admitting: Psychiatry

## 2018-11-22 ENCOUNTER — Other Ambulatory Visit: Payer: Self-pay

## 2018-11-22 DIAGNOSIS — Z8659 Personal history of other mental and behavioral disorders: Secondary | ICD-10-CM

## 2018-11-22 DIAGNOSIS — F4010 Social phobia, unspecified: Secondary | ICD-10-CM

## 2018-11-22 DIAGNOSIS — F330 Major depressive disorder, recurrent, mild: Secondary | ICD-10-CM

## 2018-11-22 DIAGNOSIS — F411 Generalized anxiety disorder: Secondary | ICD-10-CM

## 2018-11-22 NOTE — Progress Notes (Signed)
Psychotherapy Progress Note [facility] Luan Moore, [name]  Patient ID: YAMARIS CUMMINGS     MRN: [id number]     Therapy format: Individual psychotherapy Date: 11/22/2018     Start: 2:20p Stop: 3:10p Time Spent: 50 min Location: telehealth   Telehealth visit -- I connected with this patient by an approved telecommunication method (video), with her informed consent, and verifying identity and patient privacy.  I was located at my office and patient at her home.  As needed, we discussed the limitations, risks, and security and privacy concerns associated with telehealth service, including the availability and conditions which currently govern in-person appointments and the possibility that 3rd-party payment may not be fully guaranteed and she may be responsible for charges.  After she indicated understanding, we proceeded with the session.  Also discussed treatment planning, as needed, including ongoing verbal agreement with the plan, the opportunity to ask and answer all questions, her demonstrated understanding of instructions, and her readiness to call the office should symptoms worsen or she feels she is in a crisis state and needs more immediate and tangible assistance.  Session narrative (presenting needs, interim history, self-report of stressors and symptoms, applications of prior therapy, status changes, and interventions made in session) Quarantining while husband under COVID testing, just got word today he is negative.  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  Counseling with pastor and a friend, both, trying to keep her serenity.  Having  to work to hold off the impulse to believe she "sucks" at her work, Clinical research associate, Social research officer, government.  Struggling with anger at being judged and misunderstood, eventually realizes the partnership with her supervising physician is what so hurts to have questioned; when she steps back, she can see it is getting warmer gradually.    Discussed at some length perceptions of the working relationship with Dr. Ulyses Jarred, coaching in approaching her with less need for a friend/advocate and more trust that she is working out a Retail buyer.  Briefly coached in paradoxical relaxation techniques -- 90-second fist-clench, slowed breathing in whisper-curses -- for catharsis and self-soothing.  Therapeutic modalities: Cognitive Behavioral Therapy, Solution-Oriented/Positive Psychology and faith-sensitive  Mental Status/Observations:  Appearance:   Casual     Behavior:  Appropriate  Motor:  Normal  Speech/Language:   Clear and Coherent  Affect:  Appropriate and tense  Mood:  anxious and irritable  Thought process:  normal  Thought content:    worries, injuries to dignity  Sensory/Perceptual disturbances:    WNL  Orientation:  grossly intact  Attention:  Good  Concentration:  Good  Memory:  WNL  Insight:    Good  Judgment:   Good  Impulse Control:  Good   Risk Assessment: Danger to Self: No Self-injurious Behavior: No Danger to Others: No Physical Aggression / Violence: No Duty to Warn: No Access to Firearms a concern: No  Assessment of progress:  situational setback(s)  Diagnosis:   ICD-10-CM   1. Social anxiety disorder  F40.10   2. Mild episode of recurrent major depressive disorder (HCC)  F33.0   3. Generalized anxiety disorder  F41.1   4. History of bulimia nervosa  Z86.59    Plan:  . Use self-soothing techniques -- straightforward or paradoxical PRN . Work with assigned mentor to see what is called for and whether she has true support . Try to limit how much gets argued in dealing with perceived  unfairness -- her audience is just as liable to react emotionally as rationally, if not more so . Other recommendations/advice as noted above . Continue to utilize previously learned skills ad lib . Maintain medication as prescribed and work faithfully with relevant prescriber(s) if any changes are desired or seem indicated . Call the clinic on-call service, present to ER, or call 911 if any life-threatening psychiatric crisis Return in about 2 weeks (around 12/06/2018).   Robley Friesobert Setsuko Robins, PhD Marliss CzarAndy Lafaye Mcelmurry, [name] Clinical Psychologist, [facility] [address], [address] [place], [place] [address] [phone](o) [phone]

## 2018-11-23 ENCOUNTER — Encounter: Payer: Self-pay | Admitting: Psychiatry

## 2018-11-23 ENCOUNTER — Other Ambulatory Visit: Payer: Self-pay

## 2018-11-23 ENCOUNTER — Ambulatory Visit (INDEPENDENT_AMBULATORY_CARE_PROVIDER_SITE_OTHER): Payer: 59 | Admitting: Psychiatry

## 2018-11-23 DIAGNOSIS — F330 Major depressive disorder, recurrent, mild: Secondary | ICD-10-CM

## 2018-11-23 DIAGNOSIS — F411 Generalized anxiety disorder: Secondary | ICD-10-CM

## 2018-11-23 DIAGNOSIS — F4010 Social phobia, unspecified: Secondary | ICD-10-CM

## 2018-11-23 MED ORDER — LAMOTRIGINE 200 MG PO TABS: 150.0000 mg | ORAL_TABLET | Freq: Every day | ORAL | Status: DC

## 2018-11-23 MED ORDER — BUPROPION HCL ER (XL) 300 MG PO TB24: 300.0000 mg | ORAL_TABLET | Freq: Every morning | ORAL | 1 refills | Status: DC

## 2018-11-23 NOTE — Progress Notes (Signed)
Kathleen DellMargaret M Salamone [id number] 1961-02-07 58 y.o.  Virtual Visit via Telephone Note  I connected with pt on 11/23/18 at  9:30 AM EDT by telephone and verified that I am speaking with the correct person using two identifiers.   I discussed the limitations, risks, security and privacy concerns of performing an evaluation and management service by telephone and the availability of in person appointments. I also discussed with the patient that there may be a patient responsible charge related to this service. The patient expressed understanding and agreed to proceed.   I discussed the assessment and treatment plan with the patient. The patient was provided an opportunity to ask questions and all were answered. The patient agreed with the plan and demonstrated an understanding of the instructions.   The patient was advised to call back or seek an in-person evaluation if the symptoms worsen or if the condition fails to improve as anticipated.  I provided 30 minutes of non-face-to-face time during this encounter.  The patient was located at home.  [facility]   Corie ChiquitoJessica Paul Torpey, PMHNP   Subjective:   Patient ID:  Kathleen Soto is a 58 y.o. (DOB 1961-02-07) female.  Chief Complaint:  Chief Complaint  Patient presents with  . Follow-up    Anxiety, Depression, Insomnia    HPI Kathleen DellMargaret M Hartgrove presents for follow-up of depression and anxiety. She reports, "I've been doing ok, basically." She reports some situational stress with work and family possibly exposed to COVID. She reports that she has had some anxiety and difficulty with sleep during times of severe stress. She reports some improvement in stressors at work. "I think overall I am fine." She reports that her mood is ok when not thinking about work stress. "I'm fine as long as things are going well." She reports some catastrophic thinking at times. She reports that she has periods of increased  anxiety and sad mood in response to stress. Reports that she has had irritability in response to stressor. Denies panic attacks. Reports that anxiety s/s are relieved with physical activity.  Reports that she typically sleeps 5-6 hours and typically stays up later. Reports that she has had some middle of the night awakenings. Reports that her appetite has been good. Reports that she has not been able to exercise regularly and this was helpful for mood and anxiety in the past. Reports obsessive thoughts about weight and food are brief and infrequent. Reports that her motivation was low during time of most intense stress and has recently improved. Reports energy has been ok. She reports that she was having more difficulty with concentration when stress was higher. Denies SI.   Reports that she has forgotten to take her morning medications on some days.   She reports that she [place] today.   Past Psychiatric Medication Trials: Zoloft Paxil-sexual side effects, weight gain Luvox-ineffective Lexapro Prozac-sexual side effects Effexor Wellbutrin Latuda Abilify-effective Lamictal Strattera BuSpar   Review of Systems:  Review of Systems  Musculoskeletal: Negative for gait problem.  Skin: Positive for rash.       Reports worsening in chronic hives with stress  Neurological: Positive for tremors.       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  Current Outpatient Medications  Medication Sig Dispense Refill  . buPROPion (WELLBUTRIN XL) 300 MG 24  hr tablet Take 1 tablet (300 mg total) by mouth every morning. 90 tablet 1  . desloratadine (CLARINEX) 5 MG tablet Take 5 mg by mouth daily.    Marland Kitchen ezetimibe (ZETIA) 10 MG tablet Take 10 mg by mouth daily.    . Famotidine (PEPCID PO) Take by mouth 2 (two) times daily.    Marland Kitchen lamoTRIgine (LAMICTAL) 200 MG tablet  Take 1 tablet (200 mg total) by mouth daily.    Marland Kitchen levalbuterol (XOPENEX HFA) 45 MCG/ACT inhaler Inhale into the lungs every 4 (four) hours as needed for wheezing.    Marland Kitchen levocetirizine (XYZAL) 5 MG tablet Take 5 mg by mouth every evening.    . Levothyroxine Sodium 100 MCG CAPS Take 100 mcg by mouth daily.     . methocarbamol (ROBAXIN) 500 MG tablet Take 500 mg by mouth every 6 (six) hours as needed for muscle spasms.    . montelukast (SINGULAIR) 10 MG tablet Take 10 mg by mouth at bedtime.    . Omalizumab (XOLAIR [place]) Inject into the skin.    . pantoprazole (PROTONIX) 40 MG tablet Take 40 mg by mouth daily.    Marland Kitchen levalbuterol (XOPENEX) 0.63 MG/3ML nebulizer solution Take 3 mLs (0.63 mg total) by nebulization every 6 (six) hours as needed for wheezing or shortness of breath. 30 mL 1   No current facility-administered medications for this visit.     Medication Side Effects: None  Allergies:  Allergies  Allergen Reactions  . Prednisone     Esophageal spasms  . Sulfonamide Derivatives     REACTION: mouth ulcers    Past Medical History:  Diagnosis Date  . Depression   . GERD (gastroesophageal reflux disease)   . Hyperlipidemia   . Thyroid disease   . Urticaria, chronic     Family History  Problem Relation Age of Onset  . Thyroid disease Sister   . Cancer Father   . Thyroid disease Father   . Depression Father   . Cancer Brother   . Parkinson's disease Mother   . Alcohol abuse Paternal Grandfather   . Alcohol abuse Sister     Social History   Socioeconomic History  . Marital status: Single    Spouse name: Not on file  . Number of children: Not on file  . Years of education: Not on file  . Highest education level: Not on file  Occupational History  . Not on file  Social Needs  . Financial resource strain: Not on file  . Food insecurity    Worry: Not on file    Inability: Not on file  . Transportation needs    Medical: Not on file    Non-medical: Not on file   Tobacco Use  . Smoking status: Never Smoker  . Smokeless tobacco: Never Used  Substance and Sexual Activity  . Alcohol use: Yes    Comment: rarely  . Drug use: No  . Sexual activity: Not on file  Lifestyle  . Physical activity    Days per week: Not on file    Minutes per session: Not on file  . Stress: Not on file  Relationships  . Social Herbalist on phone: Not on file    Gets together: Not on file    Attends religious service: Not on file    Active member of club or organization: Not on file    Attends meetings of clubs or organizations: Not on file    Relationship status: Not on  file  . Intimate partner violence    Fear of current or ex partner: Not on file    Emotionally abused: Not on file    Physically abused: Not on file    Forced sexual activity: Not on file  Other Topics Concern  . Not on file  Social History Narrative  . Not on file    Past Medical History, Surgical history, Social history, and Family history were reviewed and updated as appropriate.   Please see review of systems for further details on the patient's review from today.   Objective:   Physical Exam:  There were no vitals taken for this visit.  Physical Exam Neurological:     Mental Status: She is alert and oriented to person, place, and time.     Cranial Nerves: No dysarthria.  Psychiatric:        Attention and Perception: Attention normal.        Mood and Affect: Mood is anxious.        Speech: Speech normal.        Behavior: Behavior is cooperative.        Thought Content: Thought content normal. Thought content is not paranoid or delusional. Thought content does not include homicidal or suicidal ideation. Thought content does not include homicidal or suicidal plan.        Cognition and Memory: Cognition and memory normal.        Judgment: Judgment normal.     Lab Review:  No results found for: NA, K, CL, CO2, GLUCOSE, BUN, CREATININE, CALCIUM, PROT, ALBUMIN, AST, ALT,  ALKPHOS, BILITOT, GFRNONAA, GFRAA  No results found for: WBC, RBC, HGB, HCT, PLT, MCV, MCH, MCHC, RDW, LYMPHSABS, MONOABS, EOSABS, BASOSABS  No results found for: POCLITH, LITHIUM   No results found for: PHENYTOIN, PHENOBARB, VALPROATE, CBMZ   .res Assessment: Plan:   Discussed potential benefits, risks, and side effects of several treatment options to include increasing lamotrigine to 200 mg daily to possibly improve mood reactivity.  Also discussed that Viibryd may be a consideration in the future if anxiety is not fully controlled since it has a lower risk of sexual side effects which has been issue with serotonergic meds for patient in the past. Patient reports that she may increase lamotrigine to 200 mg daily to improve mood and reports that she has an adequate supply of both lamotrigine 100 mg and 150 mg tabs to equal 200 mg dose.  Advised patient to contact office if she needs a new prescription for lamotrigine. Will continue Wellbutrin XL 300 mg daily for depression. Recommend continuing psychotherapy with Marliss CzarAndy Mitchum, PhD. Patient to follow-up with this provider in 3 months or sooner if clinically indicated. Patient advised to contact office with any questions, adverse effects, or acute worsening in signs and symptoms.  Claris CheMargaret was seen today for follow-up.  Diagnoses and all orders for this visit:  Generalized anxiety disorder  Mild episode of recurrent major depressive disorder (HCC) -     lamoTRIgine (LAMICTAL) 200 MG tablet; Take 1 tablet (200 mg total) by mouth daily. -     buPROPion (WELLBUTRIN XL) 300 MG 24 hr tablet; Take 1 tablet (300 mg total) by mouth every morning.  Social anxiety disorder    Please see After Visit Summary for patient specific instructions.  No future appointments.  No orders of the defined types were placed in this encounter.     -------------------------------

## 2018-12-04 ENCOUNTER — Other Ambulatory Visit: Payer: Self-pay | Admitting: Psychiatry

## 2018-12-04 DIAGNOSIS — F330 Major depressive disorder, recurrent, mild: Secondary | ICD-10-CM

## 2018-12-07 ENCOUNTER — Ambulatory Visit: Payer: 59 | Admitting: Psychiatry

## 2018-12-14 ENCOUNTER — Ambulatory Visit (INDEPENDENT_AMBULATORY_CARE_PROVIDER_SITE_OTHER): Payer: 59 | Admitting: Psychiatry

## 2018-12-14 ENCOUNTER — Other Ambulatory Visit: Payer: Self-pay

## 2018-12-14 DIAGNOSIS — Z8659 Personal history of other mental and behavioral disorders: Secondary | ICD-10-CM

## 2018-12-14 DIAGNOSIS — F411 Generalized anxiety disorder: Secondary | ICD-10-CM | POA: Diagnosis not present

## 2018-12-14 DIAGNOSIS — F330 Major depressive disorder, recurrent, mild: Secondary | ICD-10-CM | POA: Diagnosis not present

## 2018-12-14 DIAGNOSIS — F4010 Social phobia, unspecified: Secondary | ICD-10-CM

## 2018-12-14 NOTE — Progress Notes (Signed)
Psychotherapy Progress Note [facility] Marliss CzarAndy Kynlei Piontek, [name]  Patient ID: Kathleen Soto     MRN: [id number]     Therapy format: Individual psychotherapy Date: 12/14/2018     Start: 8:18a Stop: 9:01a Time Spent: 43 min Location: telehealth   Telehealth visit -- I connected with this patient by an approved telecommunication method (video), with her informed consent, and verifying identity and patient privacy.  I was located at my office and patient at her home.  As needed, we discussed the limitations, risks, and security and privacy concerns associated with telehealth service, including the availability and conditions which currently govern in-person appointments and the possibility that 3rd-party payment may not be fully guaranteed and she may be responsible for charges.  After she indicated understanding, we proceeded with the session.  Also discussed treatment planning, as needed, including ongoing verbal agreement with the plan, the opportunity to ask and answer all questions, her demonstrated understanding of instructions, and her readiness to call the office should symptoms worsen or she feels she is in a crisis state and needs more immediate and tangible assistance.  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  Mentor seems to have backed off in one hoped-for impulse to call out the unfairness of it, which also provoked some irritation, not quite despair.   Has been able to call it, as of yesterday, that business people don't exactly know what the professional standards are for nursing and it is essentially out of bounds to characterize issues raised as deficiencies in nursing performance.  Also constructive confrontation made to manager who is a therapist that she would have aboard of her own to determine the standards of her own profession.  Is sure that support staff have a "script" for how to handle PT's email now, which stories indicate has created extra hardship in carrying out her usual duties  Empathized.  Per her faith, reminded that Jesus knows what unfair judgment feels like, and two reasons for submitting to it were to demonstrate empathy with what we go through and to let it play out how the most painful, demeaning things are still temporary, and smaller than God's love, plan, and purpose for us.  Became distressed over the strong impulse to defend herself.  Assured she has spoken what needed speaking, made her objections, and encouraged to trust that her example and actual performance of nursing will speak for itself -- no guarantee who is listening but God -- and that the Spirit will continue to speak for her in ways and at times that she cannot hear for herself, keep faith that conscience and people who know her better will advocate, just keep resisting the urge to accuse management of unprofessionalism and dramatize things like support staff revealing they have guidance about dealing with her as "breach of confidentiality" and "hostile workplace".  Tearfully agrees.  Therapeutic modalities: Cognitive Behavioral Therapy, Dialectical Behavioral Therapy, Assertiveness/Communication, Ego-Supportive and faith-sensitive  Mental Status/Observations:  Appearance:   Neat     Behavior:  Appropriate  Motor:  Normal  Speech/Language:   Clear and Coherent  Affect:  Appropriate, Tearful and tense  Mood:  anxious and irritated  Thought process:   normal  Thought content:    WNL and worries, some resentment  Sensory/Perceptual disturbances:    WNL  Orientation:  grossly intact  Attention:  Good  Concentration:  Good  Memory:  WNL  Insight:    Good  Judgment:   Good  Impulse Control:  Good   Risk Assessment: Danger to Self: No Self-injurious Behavior: No Danger to Others: No Physical Aggression / Violence: No Duty to Warn: No Access to Firearms a concern: No  Assessment of progress:  situational setback(s)  Diagnosis:   ICD-10-CM   1. Social anxiety disorder  F40.10   2. Generalized anxiety disorder  F41.1   3. Mild episode of recurrent major depressive disorder (HCC)  F33.0   4. History of bulimia nervosa  Z86.59    Plan:  . Try to hold back on further challenges to management -- looks fruitless to try to teach professional boundaries and business etiquette any further as the subject of disciplinary action, will only make her look self-important to . Emphasize trust in her usual work and example to speak, and God to notice, and the Spirit to nudge others where she cannot . Other recommendations/advice as noted above . Continue to utilize previously learned skills ad lib . Maintain medication as prescribed and work faithfully with relevant prescriber(s) if any changes are desired or seem indicated . Call the clinic on-call service, present to ER, or call 911 if any life-threatening psychiatric crisis Return in about 2 weeks (around 12/28/2018) for will call.  Blanchie Serve, PhD Luan Moore, [name] Clinical Psychologist, [facility] [address], [place], [place] [address] [phone]

## 2018-12-21 ENCOUNTER — Other Ambulatory Visit: Payer: Self-pay

## 2018-12-21 DIAGNOSIS — Z20822 Contact with and (suspected) exposure to covid-19: Secondary | ICD-10-CM

## 2018-12-23 LAB — NOVEL CORONAVIRUS, NAA: SARS-CoV-2, NAA: NOT DETECTED

## 2018-12-26 ENCOUNTER — Telehealth: Payer: Self-pay | Admitting: Psychiatry

## 2018-12-26 DIAGNOSIS — F411 Generalized anxiety disorder: Secondary | ICD-10-CM

## 2018-12-26 DIAGNOSIS — F3342 Major depressive disorder, recurrent, in full remission: Secondary | ICD-10-CM

## 2018-12-26 DIAGNOSIS — G4700 Insomnia, unspecified: Secondary | ICD-10-CM

## 2018-12-26 NOTE — Telephone Encounter (Signed)
[name] called concerning a new medication that you had discussed.  She a few more questions.  Please call.  Appt 8/14.

## 2018-12-27 MED ORDER — MIRTAZAPINE 15 MG PO TABS: ORAL_TABLET | ORAL | 1 refills | Status: DC

## 2018-12-27 MED ORDER — HYDROXYZINE PAMOATE 25 MG PO CAPS: 25.0000 mg | ORAL_CAPSULE | Freq: Three times a day (TID) | ORAL | 0 refills | Status: DC | PRN

## 2018-12-27 MED ORDER — PROPRANOLOL HCL 10 MG PO TABS: ORAL_TABLET | ORAL | 1 refills | Status: DC

## 2018-12-27 NOTE — Addendum Note (Signed)
Addended by: Sharyl Nimrod on: 12/27/2018 07:26 PM   Modules accepted: Orders

## 2018-12-28 ENCOUNTER — Other Ambulatory Visit: Payer: Self-pay

## 2018-12-28 ENCOUNTER — Ambulatory Visit (INDEPENDENT_AMBULATORY_CARE_PROVIDER_SITE_OTHER): Payer: 59 | Admitting: Psychiatry

## 2018-12-28 DIAGNOSIS — F330 Major depressive disorder, recurrent, mild: Secondary | ICD-10-CM

## 2018-12-28 DIAGNOSIS — Z8659 Personal history of other mental and behavioral disorders: Secondary | ICD-10-CM

## 2018-12-28 DIAGNOSIS — F411 Generalized anxiety disorder: Secondary | ICD-10-CM | POA: Diagnosis not present

## 2018-12-28 DIAGNOSIS — F4010 Social phobia, unspecified: Secondary | ICD-10-CM

## 2018-12-28 NOTE — Telephone Encounter (Signed)
RTC to patient and left detailed information on medication sent in for anxiety. Instructed to call back with questions or concerns and to make sure she received message

## 2018-12-28 NOTE — Progress Notes (Signed)
Psychotherapy Progress Note [facility] Marliss CzarAndy Abbegale Stehle, [name]  Patient ID: Franz DellMargaret M Dung     MRN: [id number]     Therapy format: Individual psychotherapy Date: 12/28/2018     Start: 9:10a Stop: 10:00a Time Spent: 50 min Location: telehealth   Telehealth visit -- I connected with this patient by an approved telecommunication method (video), with her informed consent, and verifying identity and patient privacy.  I was located at my office and patient at her home.  As needed, we discussed the limitations, risks, and security and privacy concerns associated with telehealth service, including the availability and conditions which currently govern in-person appointments and the possibility that 3rd-party payment may not be fully guaranteed and she may be responsible for charges.  After she indicated understanding, we proceeded with the session.  Also discussed treatment planning, as needed, including ongoing verbal agreement with the plan, the opportunity to ask and answer all questions, her demonstrated understanding of instructions, and her readiness to call the office should symptoms worsen or she feels she is in a crisis state and needs more immediate and tangible assistance.  Session narrative (presenting needs, interim history, self-report of stressors and symptoms, applications of prior therapy, status changes, and interventions made in session) Mentoring relationship has turned negative, feeling mortified, urgent to leave her job, but getting a mixed bag of truths and persecution, some encouraging developments.  Recently got short reactions from Leah (her Restaurant manager, fast foodremedial preceptor), pushback about objecting to the language of her performance improvement plan.  Feeling painted as a difficult child, after she had felt she had a solid advocate in Leah; now saying things like if more than 1 or 2 people say something about you, it must be true, which feels invalidating, since she has seen  rumor and perception take over before, and she has been ostracized before, in a way that ultimately led her to eating disorder, depressive breakdown, and hospitalization.  Perception that the subtle persecution of management has essentially infected Leah, and PT finds she is getting short reactions from a comrade in nursing that feel just like the ones she's gotten from non-nursing managers.  Ultimately making it seem like PT is merely fighting authority, that everything she said trying to answer questions or acknowledge feelings in the latest phone call seemed to provoke more judgment, and that any email at all from her will be taken as argumentative.    On reflection, PT has said too much that sounds "victim-y" in the course of things.  And, on the positive side, Leah did validate that PT had been "shit on" by management, plus clear that PT's non-nursing supervisors have been giving her positive reviews lately.  Surprise blessings also to learn that Pansy, her business mgr, will be resigning, and that a kindhearted nurse she [place] [facility], who filled in for PT while out for viral sxs, is interested in PT's position if/when she leaves.  Wonders if it's a godly sign that it's OK to move on.  Also contacted supportively by a good friend (and fellow PT) Marisue IvanLiz.  Affirmed and encouraged.  PT has acknowledged feeling cornered, demeaned, and at some risk of decompensating the way she did with the breakdown of her [employer] job years ago and winding up needing hospitalization, which she would perceive as a failure, and "letting go" of her health.  Has spoken with husband about exiting the job, he receptive to the idea, mainly concerned with her (a) not giving in to past  panic and (b) managing the financial implications.  Cautioned to be sure not to let her imagination run away with her about what is being said behind her back or how much it might mean if it is.  Affirmed the insight that she has felt  entitled, to some degree, and that she can be reflexively argumentative when so, but still not the dreaded "Borderline".  Encouraged in making paced decisions and staying willing to see the positive amid the fearsome and shaming, but absolutely up to her and husband if it's worth ending the job.  In any event, clearly not breaking down, clearly more authentic and in control than her story of past disaster would suggest, and improved conviction that God, and important people, are with her in whatever happens next.  Therapeutic modalities: Cognitive Behavioral Therapy, Solution-Oriented/Positive Psychology, Humanistic/Existential and faith-sensitive  Mental Status/Observations:  Appearance:   Casual     Behavior:  Appropriate  Motor:  Normal  Speech/Language:   Clear and Coherent  Affect:  Appropriate  Mood:  sad and tense  Thought process:  normal  Thought content:    temptations to suspicion, reading in   Sensory/Perceptual disturbances:    WNL  Orientation:  grossly intact  Attention:  Good  Concentration:  Good  Memory:  WNL  Insight:    Good  Judgment:   Good  Impulse Control:  Fair   Risk Assessment: Danger to Self: No Self-injurious Behavior: No Danger to Others: No Physical Aggression / Violence: No Duty to Warn: No Access to Firearms a concern: No  Assessment of progress:  stabilized  Diagnosis:   ICD-10-CM   1. Social anxiety disorder  F40.10   2. Generalized anxiety disorder  F41.1   3. History of bulimia nervosa  Z86.59   4. Mild episode of recurrent major depressive disorder (HCC)  F33.0    Plan:  . Self-monitor for suspicious conclusion-jumping, hold off reactions as able . Self-affirm validation received, differences from prior, traumatic job scenario . Continue to weigh options with husband, endorsement to do as they together see fit . Other recommendations/advice as noted above . Continue to utilize previously learned skills ad lib . Maintain medication as  prescribed and work faithfully with relevant prescriber(s) if any changes are desired or seem indicated . Call the clinic on-call service, present to ER, or call 911 if any life-threatening psychiatric crisis Return in about 2 weeks (around 01/11/2019).  Blanchie Serve, PhD Luan Moore, [name] Clinical Psychologist, [facility] [address] [place],  [address] [phone]

## 2019-01-11 ENCOUNTER — Encounter: Payer: Self-pay | Admitting: Psychiatry

## 2019-01-11 ENCOUNTER — Ambulatory Visit: Payer: 59 | Admitting: Psychiatry

## 2019-01-11 NOTE — Progress Notes (Signed)
Admin note for no-charge patient contact  Patient ID: Kathleen Soto  MRN: [id number] DATE: 01/11/2019  NS for 3pm today.  TC received, thought appt 4pm, RS to Monday 4pm.  Asked brief RTC for important news, disclosed having resigned her job with [employer], which has been a long time coming based on poor salary and looming this summer with what sounds to have been a tempest-in-a-teapot management reaction.  Of interest, Kathleen Soto reports an argument with husband that ultimately led back to reconciled viewpoint that it is OK for her to quit without a sure plan for next job, and ironic timing that she was just polishing off her resignation letter when she was informed she needs to attend meeting with a Chartered certified accountant the next Tuesday.  Sensitized to appearances, she sent the email immediately so that it would be clear she was not resigning in reaction to the message itself.  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  Agreed that is not a stone rule, just a fair-play requirement on behalf of the organization and service to patients.  Commended on handling and on not breaking down as feared.  RTO Monday.  Blanchie Serve, PhD

## 2019-01-14 ENCOUNTER — Ambulatory Visit (INDEPENDENT_AMBULATORY_CARE_PROVIDER_SITE_OTHER): Payer: 59 | Admitting: Psychiatry

## 2019-01-14 ENCOUNTER — Other Ambulatory Visit: Payer: Self-pay

## 2019-01-14 DIAGNOSIS — F330 Major depressive disorder, recurrent, mild: Secondary | ICD-10-CM

## 2019-01-14 DIAGNOSIS — Z8659 Personal history of other mental and behavioral disorders: Secondary | ICD-10-CM | POA: Diagnosis not present

## 2019-01-14 DIAGNOSIS — F4010 Social phobia, unspecified: Secondary | ICD-10-CM | POA: Diagnosis not present

## 2019-01-14 DIAGNOSIS — F411 Generalized anxiety disorder: Secondary | ICD-10-CM | POA: Diagnosis not present

## 2019-01-14 NOTE — Progress Notes (Signed)
Psychotherapy Progress Note [facility] 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  Needed to ask another question about providing for PPE at her work sites, was given incomplete, non-matching answers, treated as though she was being picayune, and given a preemptive end to what sounds like an entirely justified conversation reconciling current reality with [employer] policies.  Went through one episode of conflict with husband before resigning that came to comments resembling those she has gotten at work, an experience that fires up self-doubt about her ability to cope.  Was able to center herself and work through, she and husband are on one page about finances and her own responsibility and freedom to refigure her working life.  Has certainly had some flareup of GI sxs during this whole episode of judgment and severance, easing up but still activated in perceived conflict.  Normalized as her   Now looking into part-time, freelance work in transcription or proofreading, using her undergrad English degree and history of being very alert to grammar, punctuation, etc.    Anniversaries coming, of father's death (01/31/07), then of mother's passing (04/04/16), through her birthday and of [place].  Fall is typically a saddening time Obtained her  [place] citizenship, legacy of her father, a Clinical research associateCanadian citizen.  Feels like some personal completion and an honor to him.  Concern for son Will, being blocked from going to [employer] school by an Technical sales engineerofficer who blames him for a COVID outbreak in his platoon despite negative tests when the unit deployed on maneuvers.  He is now thinking of leaving the military next year and going into some form of law enforcement, possibly federal.  Still worries he will get himself hurt/killed in the line of duty, but resolved to let him make his own choices.  Therapeutic modalities: Cognitive Behavioral Therapy, Solution-Oriented/Positive Psychology and Humanistic/Existential  Mental Status/Observations:  Appearance:   Casual and in scrubs from work     Behavior:  Appropriate  Motor:  Normal  Speech/Language:   Clear and Coherent  Affect:  Appropriate  Mood:  anxious and responsive, normal range  Thought process:  normal  Thought content:    WNL  Sensory/Perceptual disturbances:    WNL  Orientation:  grossly intact  Attention:  Good  Concentration:  Good  Memory:  WNL  Insight:    Good  Judgment:   Good  Impulse Control:  Good   Risk Assessment: Danger to Self: No Self-injurious Behavior: No Danger to Others: No Physical Aggression / Violence: No Duty to Warn: No Access to Firearms a concern: No  Assessment of progress:  progressing  Diagnosis:   ICD-10-CM   1. Social anxiety disorder  F40.10   2. Generalized anxiety disorder  F41.1   3. History of bulimia nervosa  Z86.59   4. Mild episode of recurrent major depressive disorder (HCC)  F33.0  resolving with work situation   Plan:  . Work out Brewing technologist with low expectations of responsiveness, and if not given sufficient guidance, be content with her own best professional judgment  . Continue pursuing alternate work in responsibility to self and household . Other recommendations/advice as noted above . Continue to utilize previously learned  skills ad lib . Maintain medication as prescribed and work faithfully with relevant prescriber(s) if any changes are desired or seem indicated . Call the clinic on-call service, present to ER, or call 911 if any life-threatening psychiatric crisis Return in about 2 weeks (around 01/28/2019).  Kathleen Serve, PhD Kathleen Moore, [name] Clinical Psychologist, [facility] [address] [place], [place] [address] [phone]

## 2019-01-25 ENCOUNTER — Other Ambulatory Visit: Payer: Self-pay

## 2019-01-25 ENCOUNTER — Ambulatory Visit (INDEPENDENT_AMBULATORY_CARE_PROVIDER_SITE_OTHER): Payer: 59 | Admitting: Psychiatry

## 2019-01-25 DIAGNOSIS — F411 Generalized anxiety disorder: Secondary | ICD-10-CM | POA: Diagnosis not present

## 2019-01-25 DIAGNOSIS — F4010 Social phobia, unspecified: Secondary | ICD-10-CM | POA: Diagnosis not present

## 2019-01-25 DIAGNOSIS — F330 Major depressive disorder, recurrent, mild: Secondary | ICD-10-CM

## 2019-01-25 DIAGNOSIS — Z8659 Personal history of other mental and behavioral disorders: Secondary | ICD-10-CM

## 2019-01-25 DIAGNOSIS — Z630 Problems in relationship with spouse or partner: Secondary | ICD-10-CM

## 2019-01-25 NOTE — Progress Notes (Signed)
Psychotherapy Progress Note [facility] Kathleen CzarAndy Babatunde Seago, [name]  Patient ID: Kathleen Soto     MRN: [id number]     Therapy format: Individual psychotherapy Date: 01/25/2019     Start: 2:07p Stop: 2:57p Time Spent: 50 min Location: telehealth   Telehealth visit -- I connected with this patient by an approved telecommunication method (video), with her informed consent, and verifying identity and patient privacy.  I was located at my office and patient at her home.  As needed, we discussed the limitations, risks, and security and privacy concerns associated with telehealth service, including the availability and conditions which currently govern in-person appointments and the possibility that 3rd-party payment may not be fully guaranteed and she may be responsible for charges.  After she indicated understanding, we proceeded with the session.  Also discussed treatment planning, as needed, including ongoing verbal agreement with the plan, the opportunity to ask and answer all questions, her demonstrated understanding of instructions, and her readiness to call the office should symptoms worsen or she feels she is in a crisis state and needs more immediate and tangible assistance.  Session narrative (presenting needs, interim history, self-report of stressors and symptoms, applications of prior therapy, status changes, and interventions made in session) Ambivalent feelings these days with her resignation.  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  Somewhat unsettling personality/attitude test at the end of the application.  Also looking into transcription and proofreading roles.  Concerns right now for whether H [name] will flip his attitude once he is dealing more tangibly with interruption in income and being on point to manage.  Probed her sense of his personal development and improved emotional control -- some better, not as reliable as it  seemed when he was actively managing carb addiction and losing weight.  Also clarified that she can raise or lower the risk by adjusting how frequently she initiates uncomfortable moments of various kinds.  Some optimism that he will be glad she is more available as his parents are ailing more.  Knows she can be tempted to defensiveness, maybe overexplain.    Settling further about her situation at [employer], clearer that she was quite over-handled by insecure authorities.  Did find out she was not actually set up for firing at the moment she felt it was about to happen, but she was being set up for an escalation in disciplinary action, based on the annoyance of her asking about PPE availability (PT's perspective important adherence to regulations, supervisor's perspective being pesky and impertinent).  Exit interview was validating in some respects, including what appears to be some civil war, or at least inconsistency, in the management culture of the company, validation of her loyalty and effort over time, and eligibility for rehire.  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 Piedad ClimesStiles) to do justice to their previously very good working relationship.   Validating feedback, concerns cleared about how she has been regarded.    Still challenged to keep from worrying how she is perceived and being primed to defend herself, even though she is less than two weeks from ending the job.  Affirmed and encouraged to accept that not all truths will be seen or agreed to, nor do they have to be for her to be OK.  Still worthwhile changing jobs, and still has both  gifts to offer in the world and places to be more welcome and more fairly worked with.  Spiritual direction work continues with pastor.  Discussed Jesus' example as pertains to her situations with work and being judged.  Therapeutic modalities:  Cognitive Behavioral Therapy, Humanistic/Existential, Narrative and faith-sensitive  Mental Status/Observations:  Appearance:   Casual     Behavior:  Appropriate  Motor:  Normal  Speech/Language:   Clear and Coherent  Affect:  Appropriate  Mood:  anxious and mildly irritable  Thought process:  normal  Thought content:    worrisome  Sensory/Perceptual disturbances:    WNL  Orientation:  grossly intact  Attention:  Good  Concentration:  Good  Memory:  WNL  Insight:    Good  Judgment:   Good  Impulse Control:  Good   Risk Assessment: Danger to Self: No Self-injurious Behavior: No Danger to Others: No Physical Aggression / Violence: No Duty to Warn: No Access to Firearms a concern: No  Assessment of progress:  progressing  Diagnosis:   ICD-10-CM   1. Social anxiety disorder  F40.10   2. Generalized anxiety disorder  F41.1   3. History of bulimia nervosa  Z86.59   4. Mild episode of recurrent major depressive disorder (HCC)  F33.0   5. Relationship problem between partners  Z63.0     Plan:  . Seek to low-key the work-off period as much as able, practice letting irritations and perceived judgment go unchallenged . Continue spiritual direction work . Other recommendations/advice as noted above . Continue to utilize previously learned skills ad lib . Maintain medication as prescribed and work faithfully with relevant prescriber(s) if any changes are desired or seem indicated . Call the clinic on-call service, present to ER, or call 911 if any life-threatening psychiatric crisis Return in about 2 weeks (around 02/08/2019).  Blanchie Serve, PhD Luan Moore, [name] Clinical Psychologist, [facility] [address] [place], [place] [address] [phone]

## 2019-02-18 ENCOUNTER — Other Ambulatory Visit: Payer: Self-pay

## 2019-02-18 ENCOUNTER — Encounter

## 2019-02-18 ENCOUNTER — Ambulatory Visit (INDEPENDENT_AMBULATORY_CARE_PROVIDER_SITE_OTHER): Payer: 59 | Admitting: Psychiatry

## 2019-02-18 DIAGNOSIS — F330 Major depressive disorder, recurrent, mild: Secondary | ICD-10-CM

## 2019-02-18 DIAGNOSIS — Z630 Problems in relationship with spouse or partner: Secondary | ICD-10-CM

## 2019-02-18 DIAGNOSIS — F4010 Social phobia, unspecified: Secondary | ICD-10-CM

## 2019-02-18 DIAGNOSIS — Z8659 Personal history of other mental and behavioral disorders: Secondary | ICD-10-CM

## 2019-02-18 NOTE — Progress Notes (Signed)
Psychotherapy Progress Note [facility] Marliss Czar, [name]  Patient ID: Kathleen Soto     MRN: [id number]     Therapy format: Individual psychotherapy Date: 02/18/2019     Start: 10:11a Stop: 11:01a Time Spent: 50 min Location: in-person   Session narrative (presenting needs, interim history, self-report of stressors and symptoms, applications of prior therapy, status changes, and interventions made in session) Recent family wedding where people largely failed to observe COVID precautions.  Settling into post-work life, took a personal retreat in [place] to recharge, do some self-reflection and planning.  Senses some resentment from husband for quitting.  In retrospect, could have done better at representing ("fighting for") herself, was listening to advice of friends to -- uncharacteristically -- fight.  Notes last day meeting with psychiatrist she partnered with, found herself fishing for empathy, and it's just too much about business for that.  Support provided.  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  Trying to organize her time to prevent meandering and be of identifiable help to family.  Trying to journal for gratitude, and did a list of gifts and faults (more detailed about the latter).  Trying to reframe "faults" as "challenges".    Has unexpectedly discovered osteopenia turned to osteoporosis, attributed to 10 months off exercise routine.  Knows it began with her EDO.    Main challenge with [facility], who is eroding in his eating program and relapsing in codependent behaviors like mindreading, overreacting, ginning up problems to be her fault, and exaggerating decisions in terms of her great need.  Also cuts her off, and in the mood can take any question or attempt to speak as trigger to monologue and shut down.  Support/empathy provided, and counseled to mainly manage her own urgency in response to these  things, trying still to tame her own susceptibility to answer and debate, as if being cross-examined.  Story of recent faux pas spoken while working up more of an intimate mood with H, which led to intense complaints by him, with gunnysacking, faultfinding, and , loss of interest.   Suggested two "anti-debate" questions -- "Am I the enemy?" and "What do you need out of this?"  For double messages, suggested reflecting back the two messages he's given, ask which is the more important.  Given her sincere interest in owning up to actual failings and recruiting positive regard and interactions, oriented also to "world class apology" framework with encouragement to set an example of "do-overs", if allowed, for further faux pas.  Given H's explicit rejection of counseling, could be regarded as "stealth" marriage counseling, embodying example of how she would like him to communicate under duress.  Caveat that she be quick to check any unspoken expectations for him to take the lesson, either quickly or obviously, just example, no strings attached, hopefully with divine reassurance she is being constructive.  Therapeutic modalities: Cognitive Behavioral Therapy, Assertiveness/Communication, Solution-Oriented/Positive Psychology and faith-sensitive  Mental Status/Observations:  Appearance:   Casual     Behavior:  Appropriate  Motor:  Normal  Speech/Language:   Clear and Coherent  Affect:  Appropriate  Mood:  euthymic and situtional frustration  Thought process:  normal  Thought content:    WNL  Sensory/Perceptual disturbances:    WNL  Orientation:  grossly intact  Attention:  Good  Concentration:  Good  Memory:  WNL  Insight:    Good  Judgment:   Good  Impulse Control:  Good   Risk Assessment: Danger to Self: No Self-injurious Behavior: No Danger to Others: No Physical Aggression / Violence: No Duty to Warn: No Access to Firearms a concern: No  Assessment of progress:  progressing  Diagnosis:    ICD-10-CM   1. Social anxiety disorder  F40.10   2. Relationship problem between partners  Z63.0   3. History of bulimia nervosa  Z86.59   4. Mild episode of recurrent major depressive disorder ([place])  F33.0     Plan:  . Use "anti-debate" tips with husband . Other recommendations/advice as noted above . Continue to utilize previously learned skills ad lib . Maintain medication as prescribed and work faithfully with relevant prescriber(s) if any changes are desired or seem indicated . Call the clinic on-call service, present to ER, or call 911 if any life-threatening psychiatric crisis Return in about 2 weeks (around 03/04/2019).  Blanchie Serve, PhD Luan Moore, [name] Clinical Psychologist, [facility] [address] [place], [place] [address] [phone]

## 2019-02-21 ENCOUNTER — Other Ambulatory Visit: Payer: Self-pay | Admitting: Psychiatry

## 2019-02-21 DIAGNOSIS — F3342 Major depressive disorder, recurrent, in full remission: Secondary | ICD-10-CM

## 2019-02-21 DIAGNOSIS — F411 Generalized anxiety disorder: Secondary | ICD-10-CM

## 2019-02-21 DIAGNOSIS — G4700 Insomnia, unspecified: Secondary | ICD-10-CM

## 2019-02-22 ENCOUNTER — Other Ambulatory Visit: Payer: Self-pay

## 2019-02-22 DIAGNOSIS — Z20822 Contact with and (suspected) exposure to covid-19: Secondary | ICD-10-CM

## 2019-02-23 LAB — NOVEL CORONAVIRUS, NAA: SARS-CoV-2, NAA: NOT DETECTED

## 2019-02-24 ENCOUNTER — Other Ambulatory Visit: Payer: Self-pay | Admitting: Psychiatry

## 2019-02-24 DIAGNOSIS — F411 Generalized anxiety disorder: Secondary | ICD-10-CM

## 2019-03-05 ENCOUNTER — Ambulatory Visit (INDEPENDENT_AMBULATORY_CARE_PROVIDER_SITE_OTHER): Payer: 59 | Admitting: Psychiatry

## 2019-03-05 ENCOUNTER — Other Ambulatory Visit: Payer: Self-pay

## 2019-03-05 DIAGNOSIS — F4010 Social phobia, unspecified: Secondary | ICD-10-CM

## 2019-03-05 DIAGNOSIS — F330 Major depressive disorder, recurrent, mild: Secondary | ICD-10-CM

## 2019-03-05 DIAGNOSIS — Z630 Problems in relationship with spouse or partner: Secondary | ICD-10-CM

## 2019-03-05 DIAGNOSIS — Z8659 Personal history of other mental and behavioral disorders: Secondary | ICD-10-CM

## 2019-03-05 NOTE — Progress Notes (Addendum)
Psychotherapy Progress Note [facility] Kathleen Moore, [name]  Patient ID: Kathleen Soto     MRN: [id number]     Therapy format: Individual psychotherapy Date: 03/05/2019     Start: 2:30p Stop: 3:20p Time Spent: 50 min Location: in-person   Session narrative (presenting needs, interim history, self-report of stressors and symptoms, applications of prior therapy, status changes, and interventions made in session) Calming, realizes she is doing better than she catastrophized.  Getting things done in the house, storage, and sorting through heirlooms and mementoes, partially for decorating's sake.   H Kathleen Soto coping well, and more optimistic that she can "prove" to him she will still contribute in a home role, as opposed to necessarily breadwinning.  Realizes her work room at home, which used to be her "happy place", got tainted by the breakdown with her former work, as this was the place she was in when she incurred suspicion, punishment, and conflict.  Feels lucky, and successful, making progress on home and H's confidence in her.  Does miss the camaraderie of her coworkers, though.  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  Has caught nascent impulses to put off pieces of housework and gotten herself to engage anyway.  Got out walking again, another achievement.  Trying "What do you need?" with Kathleen Soto, to some good effect trying to shortcut irritable moments and resist self-defense, explaining, and tense concern.   B Kathleen Soto battling leukemia.  Election season, and political divide in the marriage add tension.  Stress-related hives.  Therapeutic modalities: Cognitive Behavioral Therapy, Assertiveness/Communication and Solution-Oriented/Positive Psychology  Mental Status/Observations:  Appearance:   Casual     Behavior:  Appropriate  Motor:  Normal  Speech/Language:   Clear and Coherent   Affect:  Appropriate  Mood:  anxious and responsive  Thought process:  normal  Thought content:    worries  Sensory/Perceptual disturbances:    WNL  Orientation:  grossly intact  Attention:  Good  Concentration:  Good  Memory:  WNL  Insight:    Good  Judgment:   Good  Impulse Control:  Good   Risk Assessment: Danger to Self: No Self-injurious Behavior: No Danger to Others: No Physical Aggression / Violence: No Duty to Warn: No Access to Firearms a concern: No  Assessment of progress:  progressing  Diagnosis:   ICD-10-CM   1. Social anxiety disorder  F40.10   2. Relationship problem between partners  Z63.0   3. Mild episode of recurrent major depressive disorder (HCC)  F33.0   4. History of bulimia nervosa  Z86.59     Plan:  . Continue "anti debate" communication tips, valuable homemaking activities, and reframing as authoritative and a form of using her gifts . Maintain health habits including walking for morale . Use breathing and other available self-soothing techniques . Other recommendations/advice as noted above . Continue to utilize previously learned skills ad lib . Maintain medication as prescribed and work faithfully with relevant prescriber(s) if any changes are desired or seem indicated . Call the clinic on-call service, present to ER, or call 911 if any life-threatening psychiatric crisis Return in about 3 weeks (around 03/26/2019).  Kathleen Serve, PhD Kathleen Moore, [name] Clinical Psychologist, [facility] [address] [place], [place] [address] [phone]

## 2019-03-26 ENCOUNTER — Other Ambulatory Visit: Payer: Self-pay

## 2019-03-26 ENCOUNTER — Ambulatory Visit (INDEPENDENT_AMBULATORY_CARE_PROVIDER_SITE_OTHER): Payer: 59 | Admitting: Psychiatry

## 2019-03-26 DIAGNOSIS — F411 Generalized anxiety disorder: Secondary | ICD-10-CM | POA: Diagnosis not present

## 2019-03-26 DIAGNOSIS — F4010 Social phobia, unspecified: Secondary | ICD-10-CM | POA: Diagnosis not present

## 2019-03-26 DIAGNOSIS — Z630 Problems in relationship with spouse or partner: Secondary | ICD-10-CM | POA: Diagnosis not present

## 2019-03-26 DIAGNOSIS — Z8659 Personal history of other mental and behavioral disorders: Secondary | ICD-10-CM | POA: Diagnosis not present

## 2019-03-26 NOTE — Progress Notes (Signed)
Psychotherapy Progress Note [facility] Luan Moore, [name]  Patient ID: Kathleen Soto     MRN: [id number]     Therapy format: Individual psychotherapy Date: 03/26/2019     Start: 2:15p Stop: 3:05p Time Spent: 50 min Location: in-person   Session narrative (presenting needs, interim history, self-report of stressors and symptoms, applications of prior therapy, status changes, and interventions made in session) Fx'd ankle, inconvenient for her plans at home to walk regularly and work through more homemaking tasks.  Practicing grace and patience with neighbors and husband over political differences, drawn out in this contested election.  Helpful spiritual study with her church about forgiveness, reconciliation in the intersection between faith and governance, challenging her to deepen forgiveness for her workplace and the breakdown.    Continues to [employer] over her job discipline and resignation, questioning the worth of homemaking, albeit knowing she is caregiving, maybe now for herself as well as the family and household.  Stress-related hives and GI sxs have abated.  H notices and affirms her better mood and getting things done.  Enjoyed purging [employer] stuff this weekend from her home office, albeit after finding herself avoiding it for a while.  Normalized resistance to encountering reminders, encouraged in working through.  Challenge now to intimacy -- her vaginal pain, H's unattractive weight situation, her own guilt over feeling that, dampened feelings for seeing him care less about his own health (regaining half the weight he lost, and seeing his attitude erode), plus medication for his diabetes all affecting things, plus times when his pride gets unattractive too, plus conflictual moments that cloud over desire, plus personal habits.  Working with gynecologist on solutions to pain.  Discussed possibilities for working with him to soften tone, re-make the connection  between how he presents his thoughts and feelings and turning her on/off, and to better establish nondemanding touch and 1-way pleasuring as options.  Once relationship and attitude are settled enough, emphasize attention more to how things feel than how they look to encourage sexual responsiveness.  Therapeutic modalities: Cognitive Behavioral Therapy, Assertiveness/Communication and Ego-Supportive  Mental Status/Observations:  Appearance:   Casual     Behavior:  Appropriate  Motor:  Normal  Speech/Language:   Clear and Coherent  Affect:  Appropriate  Mood:  anxious and dysthymic  Thought process:  normal  Thought content:    WNL  Sensory/Perceptual disturbances:    WNL  Orientation:  Fully oriented  Attention:  Good  Concentration:  Good  Memory:  WNL  Insight:    Good  Judgment:   Good  Impulse Control:  Good   Risk Assessment: Danger to Self: No Self-injurious Behavior: No Danger to Others: No Physical Aggression / Violence: No Duty to Warn: No Access to Firearms a concern: No  Assessment of progress:  situational setback(s)  Diagnosis:   ICD-10-CM   1. Social anxiety disorder  F40.10   2. Relationship problem between partners  Z63.0   3. Generalized anxiety disorder  F41.1   4. History of bulimia nervosa  Z86.59     Plan:  . Use communication tips for recruiting H re. irritability and working through obstacles to intimacy . Work through, as able, any remaining reminders of unwanted end to work, forgiving both self and others who contributed to irreconcilable differences . Other recommendations/advice as noted above . Continue to utilize previously learned skills ad lib . Maintain medication as prescribed and work faithfully with relevant prescriber(s) if any changes are desired  or seem indicated . Call the clinic on-call service, present to ER, or call 911 if any life-threatening psychiatric crisis Return in about 4 weeks (around 04/23/2019) for time as  available.  Robley Fries, PhD Marliss Czar, [name] Clinical Psychologist, [facility] [address], [address] [place], [place] [address] [phone]

## 2019-04-01 ENCOUNTER — Other Ambulatory Visit: Payer: Self-pay

## 2019-04-01 DIAGNOSIS — Z20822 Contact with and (suspected) exposure to covid-19: Secondary | ICD-10-CM

## 2019-04-03 LAB — NOVEL CORONAVIRUS, NAA: SARS-CoV-2, NAA: NOT DETECTED

## 2019-04-09 ENCOUNTER — Ambulatory Visit (INDEPENDENT_AMBULATORY_CARE_PROVIDER_SITE_OTHER): Payer: 59 | Admitting: Psychiatry

## 2019-04-09 ENCOUNTER — Other Ambulatory Visit: Payer: Self-pay

## 2019-04-09 DIAGNOSIS — Z630 Problems in relationship with spouse or partner: Secondary | ICD-10-CM | POA: Diagnosis not present

## 2019-04-09 DIAGNOSIS — F330 Major depressive disorder, recurrent, mild: Secondary | ICD-10-CM

## 2019-04-09 DIAGNOSIS — Z8659 Personal history of other mental and behavioral disorders: Secondary | ICD-10-CM | POA: Diagnosis not present

## 2019-04-09 DIAGNOSIS — F4010 Social phobia, unspecified: Secondary | ICD-10-CM

## 2019-04-09 NOTE — Progress Notes (Signed)
Psychotherapy Progress Note [facility] Marliss Czar, [name]  Patient ID: Kathleen Soto     MRN: [id number]     Therapy format: Individual psychotherapy Date: 04/09/2019     Start: 2:15p Stop: 3:05p Time Spent: 50 min Location: in-person   Session narrative (presenting needs, interim history, self-report of stressors and symptoms, applications of prior therapy, status changes, and interventions made in session) Ankle still healing, aggravating to deal with.  Still dealing with tendency to self-loathing, both with spiritual practices (e.g., spiritual direction, the [place], the [name]).  Keeps feeling like she is being somehow fraudulent and/or unsuited to God's love.    [name] came to her recently (birthday gathering 11/19) with idea of in 3 years they would move to [place] and take over his parents' mountain home; immediate dilemma between wanting to be agreeable and prevent triggering husband's irritability and being unready to forfeit her dream of where and how to live, followed closely by self-castigation for objecting to a gift, and [name] taking her to task for objecting and it was unfair, etc.  Quick storm of mixed emotions, between his reactivity and her intense self-demands.  Journaled and reviewed a surreptitious recording she made of the argument.  Hard part in the aftermath of it is shaming herself for "being selfish".  Tried to work through it with [name] Saturday but just got it thrown back at her as if she was being dominating, got accused of always getting what she wants, etc. Gets flustered, starts judging herself, the whole cycle she used to know much more intensely during active bulimia and depressive breakdown.    Support provided, discussed tactics for recruiting H to slow down his own reactions and let her sort out valid, mixed feelings.  Endorsed, if needed, being a little bit stern with him, on suspicion that he will actually feel more within his  comfort zone and brought easier to de-catastrophize himself if challenged about his own emotional response.  Other recommendations to go lighter on prefaces, apologies, and anything that seems to clutter up the conversation with nuances, details, equivocation, or anything that would seem to him mealy-mouthed.  Therapeutic modalities: Cognitive Behavioral Therapy, Assertiveness/Communication and Ego-Supportive  Mental Status/Observations:  Appearance:   Casual     Behavior:  Appropriate  Motor:  Normal  Speech/Language:   Clear and Coherent  Affect:  Appropriate  Mood:  anxious and dysthymic  Thought process:  normal  Thought content:    WNL  Sensory/Perceptual disturbances:    WNL  Orientation:  Fully oriented  Attention:  Good  Concentration:  Good  Memory:  WNL  Insight:    Good  Judgment:   Good  Impulse Control:  Good   Risk Assessment: Danger to Self: No Self-injurious Behavior: No Danger to Others: No Physical Aggression / Violence: No Duty to Warn: No Access to Firearms a concern: No  Assessment of progress:  situational setback(s)  Diagnosis:   ICD-10-CM   1. Social anxiety disorder  F40.10   2. Relationship problem between partners  Z63.0   3. Mild episode of recurrent major depressive disorder (HCC)  F33.0   4. History of bulimia nervosa  Z86.59    Plan:  . Use communication tips for recruiting H to be willing to hear the unwanted, and let her sort out mixed feelings without abrupt judgment . Continue spiritual disciplines as interested, concur with pastor's insights and perspective . Other recommendations/advice as noted above . Continue to utilize previously  learned skills ad lib . Maintain medication as prescribed and work faithfully with relevant prescriber(s) if any changes are desired or seem indicated . Call the clinic on-call service, present to ER, or call 911 if any life-threatening psychiatric crisis Return in about 1 month (around  05/09/2019).  Blanchie Serve, PhD Luan Moore, [name] Clinical Psychologist, [facility] [address], [place], [place] [address] [phone]

## 2019-04-10 ENCOUNTER — Other Ambulatory Visit: Payer: Self-pay

## 2019-04-10 DIAGNOSIS — Z20822 Contact with and (suspected) exposure to covid-19: Secondary | ICD-10-CM

## 2019-04-11 LAB — NOVEL CORONAVIRUS, NAA: SARS-CoV-2, NAA: NOT DETECTED

## 2019-04-24 ENCOUNTER — Encounter: Payer: Self-pay | Admitting: Psychiatry

## 2019-04-24 ENCOUNTER — Ambulatory Visit (INDEPENDENT_AMBULATORY_CARE_PROVIDER_SITE_OTHER): Payer: 59 | Admitting: Psychiatry

## 2019-04-24 ENCOUNTER — Other Ambulatory Visit: Payer: Self-pay

## 2019-04-24 DIAGNOSIS — F330 Major depressive disorder, recurrent, mild: Secondary | ICD-10-CM

## 2019-04-24 DIAGNOSIS — G4700 Insomnia, unspecified: Secondary | ICD-10-CM

## 2019-04-24 DIAGNOSIS — F411 Generalized anxiety disorder: Secondary | ICD-10-CM | POA: Diagnosis not present

## 2019-04-24 DIAGNOSIS — F4010 Social phobia, unspecified: Secondary | ICD-10-CM

## 2019-04-24 MED ORDER — CLONAZEPAM 0.5 MG PO TABS: ORAL_TABLET | ORAL | 5 refills | Status: DC

## 2019-04-24 MED ORDER — BUPROPION HCL ER (XL) 300 MG PO TB24: 300.0000 mg | ORAL_TABLET | Freq: Every morning | ORAL | 1 refills | Status: DC

## 2019-04-24 MED ORDER — LAMOTRIGINE 150 MG PO TABS: 150.0000 mg | ORAL_TABLET | Freq: Every day | ORAL | 1 refills | Status: DC

## 2019-04-24 NOTE — Progress Notes (Signed)
Kathleen Soto [id number] [birth year] 58 y.o.  Virtual Visit via Video Note  I connected with pt @ on 04/24/19 at 11:15 AM EST by a video enabled telemedicine application and verified that I am speaking with the correct person using two identifiers.   I discussed the limitations of evaluation and management by telemedicine and the availability of in person appointments. The patient expressed understanding and agreed to proceed.  I discussed the assessment and treatment plan with the patient. The patient was provided an opportunity to ask questions and all were answered. The patient agreed with the plan and demonstrated an understanding of the instructions.   The patient was advised to call back or seek an in-person evaluation if the symptoms worsen or if the condition fails to improve as anticipated.  I provided 30 minutes of non-face-to-face time during this encounter.  The patient was located at home.  The provider was located at [place].   Thayer Headings, PMHNP   Subjective:   Patient ID:  Kathleen Soto is a 58 y.o. (DOB 11-29-1960) female.  Chief Complaint:  Chief Complaint  Patient presents with  . Follow-up    History of depression, anxiety, insomnia, eating disorder    HPI Kathleen Soto presents for follow-up of depression and anxiety. Since last visit, pt has resigned from previous job. She has been exploring other job options. She reports that she is taking care of things at home and family's needs. She reports that she has been maintaining a consistent routine and being productive. Reports that she has less stress since leaving work with occasional feelings of guilt about leaving job. Reports occ brief periods of increased anxiety. She reports that she has been having some disrupted sleep, about every few nights. She reports that Remeron is helpful for insomnia but prefers not to take it regularly. She reports that her mood has improved significantly.  She reports that her energy and motivation have been good. Concentration is adequate. Has created some structure and keeping a schedule. Reports losing 10 lbs when she was very stressed but then gained it back with less activity and foot injury. She reports that she has had some thoughts of wanting to decrease weight. Denies SI.   Asks about script for Klonopin for rare occasions, about a couple times a month.   Taking a proof reading course.   Past Psychiatric Medication Trials: Zoloft Paxil-sexual side effects, weight gain Luvox-ineffective Lexapro Prozac-sexual side effects Effexor Wellbutrin Latuda Abilify-effective Lamictal Strattera BuSpar Propranolol- Helped with tremor in the past. Had light-headedness at times with it.  Remeron- Effective for insomnia Klonopin  Review of Systems:  Review of Systems  Genitourinary: Positive for pelvic pain.  Musculoskeletal: Negative for gait problem.  Neurological: Negative for tremors.  Psychiatric/Behavioral:       Please refer to HPI    Medications: I have reviewed the patient's current medications.  Current Outpatient Medications  Medication Sig Dispense Refill  . buPROPion (WELLBUTRIN XL) 300 MG 24 hr tablet Take 1 tablet (300 mg total) by mouth every morning. 90 tablet 1  . Calcium Carbonate-Vitamin D (OSCAL 500/200 D-3 PO) Take by mouth.    . desloratadine (CLARINEX) 5 MG tablet Take 5 mg by mouth daily.    Marland Kitchen ezetimibe (ZETIA) 10 MG tablet Take 10 mg by mouth daily.    . Famotidine (PEPCID PO) Take by mouth 2 (two) times daily.    Marland Kitchen lamoTRIgine (LAMICTAL) 150 MG tablet Take 1 tablet (150 mg total) by  mouth daily. 90 tablet 1  . levalbuterol (XOPENEX HFA) 45 MCG/ACT inhaler Inhale into the lungs every 4 (four) hours as needed for wheezing.    Marland Kitchen. levocetirizine (XYZAL) 5 MG tablet Take 5 mg by mouth every evening.    . Levothyroxine Sodium 100 MCG CAPS Take 100 mcg by mouth daily.     . methocarbamol (ROBAXIN) 500 MG tablet  Take 500 mg by mouth every 6 (six) hours as needed for muscle spasms.    . montelukast (SINGULAIR) 10 MG tablet Take 10 mg by mouth at bedtime.    . Omalizumab (XOLAIR ) Inject into the skin.    . pantoprazole (PROTONIX) 40 MG tablet Take 40 mg by mouth daily.    . clonazePAM (KLONOPIN) 0.5 MG tablet Take 1/2-1 tab po qd prn anxiety 10 tablet 5  . levalbuterol (XOPENEX) 0.63 MG/3ML nebulizer solution Take 3 mLs (0.63 mg total) by nebulization every 6 (six) hours as needed for wheezing or shortness of breath. 30 mL 1   No current facility-administered medications for this visit.    Medication Side Effects: None  Allergies:  Allergies  Allergen Reactions  . Prednisone     Esophageal spasms  . Sulfonamide Derivatives     REACTION: mouth ulcers    Past Medical History:  Diagnosis Date  . Depression   . GERD (gastroesophageal reflux disease)   . Hyperlipidemia   . Thyroid disease   . Urticaria, chronic     Family History  Problem Relation Age of Onset  . Thyroid disease Sister   . Cancer Father   . Thyroid disease Father   . Depression Father   . Cancer Brother   . Parkinson's disease Mother   . Alcohol abuse Paternal Grandfather   . Alcohol abuse Sister     Social History   Socioeconomic History  . Marital status: Single    Spouse name: Not on file  . Number of children: Not on file  . Years of education: Not on file  . Highest education level: Not on file  Occupational History  . Not on file  Tobacco Use  . Smoking status: Never Smoker  . Smokeless tobacco: Never Used  Substance and Sexual Activity  . Alcohol use: Yes    Comment: rarely  . Drug use: No  . Sexual activity: Not on file  Other Topics Concern  . Not on file  Social History Narrative  . Not on file   Social Determinants of Health   Financial Resource Strain:   . Difficulty of Paying Living Expenses: Not on file  Food Insecurity:   . Worried About Programme researcher, broadcasting/film/videounning Out of Food in the Last Year:  Not on file  . Ran Out of Food in the Last Year: Not on file  Transportation Needs:   . Lack of Transportation (Medical): Not on file  . Lack of Transportation (Non-Medical): Not on file  Physical Activity:   . Days of Exercise per Week: Not on file  . Minutes of Exercise per Session: Not on file  Stress:   . Feeling of Stress : Not on file  Social Connections:   . Frequency of Communication with Friends and Family: Not on file  . Frequency of Social Gatherings with Friends and Family: Not on file  . Attends Religious Services: Not on file  . Active Member of Clubs or Organizations: Not on file  . Attends BankerClub or Organization Meetings: Not on file  . Marital Status: Not on file  Intimate Partner Violence:   . Fear of Current or Ex-Partner: Not on file  . Emotionally Abused: Not on file  . Physically Abused: Not on file  . Sexually Abused: Not on file    Past Medical History, Surgical history, Social history, and Family history were reviewed and updated as appropriate.   Please see review of systems for further details on the patient's review from today.   Objective:   Physical Exam:  There were no vitals taken for this visit.  Physical Exam Neurological:     Mental Status: She is alert and oriented to person, place, and time.     Cranial Nerves: No dysarthria.  Psychiatric:        Attention and Perception: Attention normal.        Mood and Affect: Mood normal.        Speech: Speech normal.        Behavior: Behavior is cooperative.        Thought Content: Thought content normal. Thought content is not paranoid or delusional. Thought content does not include homicidal or suicidal ideation. Thought content does not include homicidal or suicidal plan.        Cognition and Memory: Cognition and memory normal.        Judgment: Judgment normal.     Lab Review:  No results found for: NA, K, CL, CO2, GLUCOSE, BUN, CREATININE, CALCIUM, PROT, ALBUMIN, AST, ALT, ALKPHOS, BILITOT,  GFRNONAA, GFRAA  No results found for: WBC, RBC, HGB, HCT, PLT, MCV, MCH, MCHC, RDW, LYMPHSABS, MONOABS, EOSABS, BASOSABS  No results found for: POCLITH, LITHIUM   No results found for: PHENYTOIN, PHENOBARB, VALPROATE, CBMZ   .res Assessment: Plan:   Patient seen for 30 minutes and greater than 50% of visit spent counseling patient regarding treatment plan and discussed continuing Lamictal 150 mg daily since patient reports taking different doses of Lamictal at various times and prefers to be on lowest amount of medication possible.  Also discussed patient's report of sexual dysfunction and discussed that the medications that she is currently taking has minimal risk of potential sexual side effects.  Patient reports that her OB/GYN communicated similar information and that she may benefit from physical therapy.  Discussed that some physical therapist specialize in pelvic floor dysfunction and use treatments to include biofeedback and this may potentially be helpful for her. Discussed patient's request to have limited number of Klonopin for occasional episodes of acute anxiety.  Patient reports that in the past Klonopin has been helpful as needed at low dose.  Discussed prescribing Klonopin 0.5 mg 1/2-1 tab p.o. daily as needed anxiety. Will continue Wellbutrin XL 300 mg daily for depression. Continue Lamictal 150 mg daily for mood. Recommend continuing psychotherapy with Marliss Czar, PhD. Patient to follow-up with this provider in 6 months or sooner if clinically indicated. Patient advised to contact office with any questions, adverse effects, or acute worsening in signs and symptoms.  Kathleen Soto was seen today for follow-up.  Diagnoses and all orders for this visit:  Social anxiety disorder -     clonazePAM (KLONOPIN) 0.5 MG tablet; Take 1/2-1 tab po qd prn anxiety  Mild episode of recurrent major depressive disorder (HCC) -     buPROPion (WELLBUTRIN XL) 300 MG 24 hr tablet; Take 1 tablet  (300 mg total) by mouth every morning. -     lamoTRIgine (LAMICTAL) 150 MG tablet; Take 1 tablet (150 mg total) by mouth daily.  Generalized anxiety disorder -  clonazePAM (KLONOPIN) 0.5 MG tablet; Take 1/2-1 tab po qd prn anxiety  Insomnia, unspecified type -     clonazePAM (KLONOPIN) 0.5 MG tablet; Take 1/2-1 tab po qd prn anxiety     Please see After Visit Summary for patient specific instructions.  Future Appointments  Date Time Provider Department Center  05/16/2019  1:00 PM Robley Fries, PhD CP-CP None  06/04/2019  2:00 PM Robley Fries, PhD [facility] None    No orders of the defined types were placed in this encounter.     -------------------------------

## 2019-05-16 ENCOUNTER — Ambulatory Visit (INDEPENDENT_AMBULATORY_CARE_PROVIDER_SITE_OTHER): Payer: 59 | Admitting: Psychiatry

## 2019-05-16 DIAGNOSIS — F330 Major depressive disorder, recurrent, mild: Secondary | ICD-10-CM | POA: Diagnosis not present

## 2019-05-16 DIAGNOSIS — F4010 Social phobia, unspecified: Secondary | ICD-10-CM

## 2019-05-16 DIAGNOSIS — Z630 Problems in relationship with spouse or partner: Secondary | ICD-10-CM | POA: Diagnosis not present

## 2019-05-16 NOTE — Progress Notes (Signed)
Psychotherapy Progress Note [facility] Luan Moore, [name]  Patient ID: Kathleen Soto     MRN: [id number]     Therapy format: Individual psychotherapy Date: 05/16/2019      Start: 1:07p     Stop: 1:53p     Time Spent: 46 min Location: Telehealth visit -- I connected with this patient by an approved telecommunication method (video), with her informed consent, and verifying identity and patient privacy.  I was located at my home and patient at her home.  As needed, we discussed the limitations, risks, and security and privacy concerns associated with telehealth service, including the availability and conditions which currently govern in-person appointments and the possibility that 3rd-party payment may not be fully guaranteed and she may be responsible for charges.  After she indicated understanding, we proceeded with the session.  Also discussed treatment planning, as needed, including ongoing verbal agreement with the plan, the opportunity to ask and answer all questions, her demonstrated understanding of instructions, and her readiness to call the office should symptoms worsen or she feels she is in a crisis state and needs more immediate and tangible assistance.   Session narrative (presenting needs, interim history, self-report of stressors and symptoms, applications of prior therapy, status changes, and interventions made in session) Currently at inlaws' [place] home, which they stand to inherit.  Objectively beautiful place, but decision now made -- tentatively, with H waffling yet -- about building their own place, in the [place].  Says that [place] seemed to be a convincer to [place], but can't be sure over time.  Generally, good Christmas, albeit including emotional support for son Belenda Cruise, who was going through an unwanted breakup.   Continuing c/o H being irritable, short with her, off his own program controlling carbs and weight, and again  mocking her, including just before this session, when she needed him to fix the wifi.  Other chippy times have brought her to mouthing back, reportedly loudly, but at least the words were assertive.  Reviewed communications and attributions at length, encouraging to be sure she isn't adding edginess but ready to call unfairness squarely and still keep explaining, debating to a minimum.  If appropriate, offered more "female" phrasing and "you know better" responses on the understanding that H subconsciously wants to be confronted, and doing so is a sign of strength he is looking for in his own understanding of marriage and in his understanding of her.  Dealt with objection that she doesn't want to have to be his mother, reframed that there is no guarantee of that, just the continuing option which kind of mother to be when faced.  Brainstormed playfully firm options as well, drawing on favorite media references.  Therapeutic modalities: Cognitive Behavioral Therapy and Solution-Oriented/Positive Psychology  Mental Status/Observations:  Appearance:   Casual     Behavior:  Appropriate  Motor:  Normal  Speech/Language:   Clear and Coherent  Affect:  Appropriate  Mood:  responsive, varied, mildly irritable  Thought process:  normal  Thought content:    WNL  Sensory/Perceptual disturbances:    WNL  Orientation:  Fully oriented  Attention:  Good  Concentration:  Good  Memory:  WNL  Insight:    Good  Judgment:   Good  Impulse Control:  Good   Risk Assessment: Danger to Self: No Self-injurious Behavior: No Danger to Others: No Physical Aggression / Violence: No Duty to Warn: No Access to Firearms a concern: No  Assessment of progress:  stabilized  Diagnosis:   ICD-10-CM   1. Social anxiety disorder  F40.10   2. Mild episode of recurrent major depressive disorder (HCC)  F33.0   3. Relationship problem between partners  Z63.0    Plan:   . Try out firmer, briefer, more process-oriented  confrontations and/or playfully pointed facetious options . Other recommendations/advice as noted above . Continue to utilize previously learned skills ad lib . Maintain medication as prescribed and work faithfully with relevant prescriber(s) if any changes are desired or seem indicated . Call the clinic on-call service, present to ER, or call 911 if any life-threatening psychiatric crisis Return in about 3 weeks (around 06/06/2019). Current [facility] appointments: Future Appointments  Date Time Provider Department Center  06/04/2019  2:00 PM Robley Fries, PhD CP-CP None    Robley Fries, PhD Marliss Czar, [name] Clinical Psychologist, [facility] [address] [place], [place] [address] [phone]

## 2019-06-04 ENCOUNTER — Ambulatory Visit (INDEPENDENT_AMBULATORY_CARE_PROVIDER_SITE_OTHER): Payer: 59 | Admitting: Psychiatry

## 2019-06-04 DIAGNOSIS — F330 Major depressive disorder, recurrent, mild: Secondary | ICD-10-CM

## 2019-06-04 DIAGNOSIS — Z8659 Personal history of other mental and behavioral disorders: Secondary | ICD-10-CM

## 2019-06-04 DIAGNOSIS — Z630 Problems in relationship with spouse or partner: Secondary | ICD-10-CM

## 2019-06-04 DIAGNOSIS — F4010 Social phobia, unspecified: Secondary | ICD-10-CM | POA: Diagnosis not present

## 2019-06-04 DIAGNOSIS — F411 Generalized anxiety disorder: Secondary | ICD-10-CM

## 2019-06-04 NOTE — Progress Notes (Signed)
Psychotherapy Progress Note [facility] Luan Moore, [name]  Patient ID: Kathleen Soto     MRN: [id number]     Therapy format: Individual psychotherapy Date: 06/04/2019      Start: 2:24p     Stop: 3:13p     Time Spent: 49 min Location: Telehealth visit -- I connected with this patient by an approved telecommunication method (video), with her informed consent, and verifying identity and patient privacy.  I was located at my office and patient at her home.  As needed, we discussed the limitations, risks, and security and privacy concerns associated with telehealth service, including the availability and conditions which currently govern in-person appointments and the possibility that 3rd-party payment may not be fully guaranteed and she may be responsible for charges.  After she indicated understanding, we proceeded with the session.  Also discussed treatment planning, as needed, including ongoing verbal agreement with the plan, the opportunity to ask and answer all questions, her demonstrated understanding of instructions, and her readiness to call the office should symptoms worsen or she feels she is in a crisis state and needs more immediate and tangible assistance.   Session narrative (presenting needs, interim history, self-report of stressors and symptoms, applications of prior therapy, status changes, and interventions made in session) Almost sent an email Saturday, in response to further tension at home.  Big fight with Konrad Dolores that finally got the word "crazy" into the conversation.  Knows he is stressed working [place] duty, but she has been working alone on the [employer], has discovered a rodent problem, and she is phobic.  Has had to talk with [facility] about the mouse problem, but he's reacting to it.  Finding him getting chippy no matter what she asks in the way of tentative dates, or asking him to go in first (mouse traps).  Tried to use empathy for his stress, validation  of his solo breadwinner role, without much success.  Ran upon a revelation that he doesn't believe she understands what it's like for him.  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  Further concern that all her good effort on the mountain house means she is behind in restoring their housekeeping after the holidays.  Was able to push back a bit on his own self-management.  Discovered also he is more seriously interested in [place] to the mountains, further threatening her wish/plan to build in the [place].  Reasonable attempt to face him with how he blusters, interrupts, and largely listens ready to defend himself.  Struggled some with guilt for wanting him to take care of things when he is the solo breadwinner, but made shorter work of it, and did use some recommendation to risk being pugnacious when warranted.  Largely, agreed he has a hard time putting himself in receptive, listening mode.  Renewed advice to keep her disclaimers short and confirm emotional readiness to take turns before trying to get points across.    Notes inspirational learnings lately in her spiritual direction class.  Still prone to shame attacks over how she handles conflict, whether she is being selfish, etc.  Resolved to try a line with [name] when fielding double messages or irritability -- "Which talk do you want to have -- the one where you're calling me out for lots of things or the  one where we can work something out?"  May also try to get advice on talking to ADHD, b/c [name] has it and may be inadequately treated.  Discussed other communication tactics like using "and" instead of "but" and reversing the phrasing of the typical "but" statement.  Has gotten a short supply of Klonopin, 1/2 one at night while at [employer], worked well to help her  sleep.  1/4 may have even worked.  Assured she is not, nor is suspected to be, heading into any kind of misuse.  Therapeutic modalities: Cognitive Behavioral Therapy and Solution-Oriented/Positive Psychology  Mental Status/Observations:  Appearance:   Casual     Behavior:  Appropriate  Motor:  Normal  Speech/Language:   Clear and Coherent  Affect:  Appropriate  Mood:  anxious  Thought process:  normal  Thought content:    WNL  Sensory/Perceptual disturbances:    WNL  Orientation:  Fully oriented  Attention:  Good  Concentration:  Good  Memory:  WNL  Insight:    Good  Judgment:   Good  Impulse Control:  Good   Risk Assessment: Danger to Self: No Self-injurious Behavior: No Danger to Others: No Physical Aggression / Violence: No Duty to Warn: No Access to Firearms a concern: No  Assessment of progress:  situational setback(s)  Diagnosis:   ICD-10-CM   1. Social anxiety disorder  F40.10   2. Mild episode of recurrent major depressive disorder (HCC)  F33.0   3. Relationship problem between partners  Z63.0   4. History of bulimia nervosa  Z86.59   5. Generalized anxiety disorder  F41.1    Plan:  . Try conflict-moderating tactics noted . Other recommendations/advice as noted above . Continue to utilize previously learned skills ad lib . Maintain medication as prescribed and work faithfully with relevant prescriber(s) if any changes are desired or seem indicated . Call the clinic on-call service, present to ER, or call 911 if any life-threatening psychiatric crisis Return in about 2 weeks (around 06/18/2019). Next scheduled in this office Visit date not found  Robley Fries, PhD Marliss Czar, [name] Clinical Psychologist, [facility] [address], [address] [place], [place] [address] [phone]

## 2019-07-09 ENCOUNTER — Ambulatory Visit: Payer: 59 | Admitting: Psychiatry

## 2019-07-17 ENCOUNTER — Ambulatory Visit (INDEPENDENT_AMBULATORY_CARE_PROVIDER_SITE_OTHER): Payer: 59 | Admitting: Psychiatry

## 2019-07-17 DIAGNOSIS — F411 Generalized anxiety disorder: Secondary | ICD-10-CM

## 2019-07-17 DIAGNOSIS — Z630 Problems in relationship with spouse or partner: Secondary | ICD-10-CM

## 2019-07-17 DIAGNOSIS — F330 Major depressive disorder, recurrent, mild: Secondary | ICD-10-CM | POA: Diagnosis not present

## 2019-07-17 DIAGNOSIS — F4010 Social phobia, unspecified: Secondary | ICD-10-CM

## 2019-07-17 NOTE — Progress Notes (Signed)
Psychotherapy Progress Note [facility] Luan Moore, [name]  Patient ID: Kathleen Soto     MRN: [id number] Therapy format: Individual psychotherapy Date: 07/17/2019      Start: 8:11a     Stop: 9:01a     Time Spent: 50 min Location: Telehealth visit -- I connected with this patient by an approved telecommunication method (video), with her informed consent, and verifying identity and patient privacy.  I was located at my office and patient at her home.  As needed, we discussed the limitations, risks, and security and privacy concerns associated with telehealth service, including the availability and conditions which currently govern in-person appointments and the possibility that 3rd-party payment may not be fully guaranteed and she may be responsible for charges.  After she indicated understanding, we proceeded with the session.  Also discussed treatment planning, as needed, including ongoing verbal agreement with the plan, the opportunity to ask and answer all questions, her demonstrated understanding of instructions, and her readiness to call the office should symptoms worsen or she feels she is in a crisis state and needs more immediate and tangible assistance.   Session narrative (presenting needs, interim history, self-report of stressors and symptoms, applications of prior therapy, status changes, and interventions made in session) Wants to review an argument with husband that she recorded.  Prompted by him nudging her to lock the door to keep puppy from charging out.  Sense that he was "loaded for bear" when she walked in, pushed her verbally while she was trying to get it done, mocked her, and it basically turned it into a diatribe about how she always has to have her say and get her way.  Admittedly, some truth to the accusation over time, but the recorded portion was, objectively, [name] holding forth and self-triggering to the next complaint as soon as she tried to  interject, in what sounded to be even tones, compared to his loud ones.  The diatribe included how she won't shut up, has to get every word in, is always right, berates him, takes the very moment he is "pissed off" to push something, and being "mad as hell".  Validated as able and coached PT to recognize earlier in the process that she can't reach an adult mindset to reason with until Hawkins feels like he has vented.  It serves their process -- and her goal of him de-escalating -- much better if she would just hold back and let his defensive feelings and instincts calm, just not say anything as long as he is at that level, because anything that even begins with "Let me" or "May I" seems to be taken as a prelude to [place] with him or manipulating.  More humane to herself not to make herself responsible in that moment for reasoning, when reasoning won't happen.  From a position of hr faith, offered that when she lets it be quieter, the Spirit can do the talking -- she says he has come back and apologized after quiet separations, one understanding being that his conscience kicked in.  Framed for her the fundamental competition between one agenda to be right, or reasonable, and the one to be partners, emotionally speaking.  Meanwhile, concerned husband has been compulsive eating, making bitter comments about going to work, sarcastic asides about her being off work, and crabby about the lack of intimacy.  Later talk (after the recorded bit) got around to him expressing feeling unappreciated, not understood, too alone with his stresses (which  include COVID frontline nursing service).  Some chance to offer understanding as he changed tone, supported as one form of behavior modification in play.  For her part, PT has felt undermined by Christmas routines, then lost a dog late January, and now they have a puppy to train and be responsible for.  Validated stresses, further assured that less is more when it comes to  responding constructively to a tirade.  Spiritually, enjoying her service on the church committee [employer]) for mental health, getting a lot out of a book, [place] [employer] the [place].    Therapeutic modalities: Cognitive Behavioral Therapy, Solution-Oriented/Positive Psychology and Faith-sensitive  Mental Status/Observations:  Appearance:   Casual     Behavior:  Appropriate  Motor:  Normal  Speech/Language:   Clear and Coherent  Affect:  Appropriate  Mood:  anxious and piqued  Thought process:  normal  Thought content:    WNL  Sensory/Perceptual disturbances:    WNL  Orientation:  Fully oriented  Attention:  Good  Concentration:  Good  Memory:  WNL  Insight:    Good  Judgment:   Good  Impulse Control:  Good   Risk Assessment: Danger to Self: No Self-injurious Behavior: No Danger to Others: No Physical Aggression / Violence: No Duty to Warn: No Access to Firearms a concern: No  Assessment of progress:  stabilized  Diagnosis:   ICD-10-CM   1. Mild episode of recurrent major depressive disorder (HCC)  F33.0   2. Relationship problem between partners  Z63.0   3. Social anxiety disorder  F40.10   4. Generalized anxiety disorder  F41.1    Plan:  . Look for opportunities to use silence to calm . Other communication tips to de-escalate if hounded . Other recommendations/advice as may be noted above . Continue to utilize previously learned skills ad lib . Maintain medication as prescribed and work faithfully with relevant prescriber(s) if any changes are desired or seem indicated . Call the clinic on-call service, present to ER, or call 911 if any life-threatening psychiatric crisis . Return in about 3 weeks (around 08/07/2019).Marland Kitchen  Next scheduled visit in this office Visit date not found.  Robley Fries, PhD Marliss Czar, [name] Clinical Psychologist, [facility] [address] [place], [place] [address] [phone]

## 2019-08-28 ENCOUNTER — Ambulatory Visit (INDEPENDENT_AMBULATORY_CARE_PROVIDER_SITE_OTHER): Payer: 59 | Admitting: Psychiatry

## 2019-08-28 ENCOUNTER — Other Ambulatory Visit: Payer: Self-pay

## 2019-08-28 DIAGNOSIS — Z630 Problems in relationship with spouse or partner: Secondary | ICD-10-CM | POA: Diagnosis not present

## 2019-08-28 DIAGNOSIS — F411 Generalized anxiety disorder: Secondary | ICD-10-CM

## 2019-08-28 DIAGNOSIS — F330 Major depressive disorder, recurrent, mild: Secondary | ICD-10-CM

## 2019-08-28 DIAGNOSIS — F4010 Social phobia, unspecified: Secondary | ICD-10-CM | POA: Diagnosis not present

## 2019-08-28 DIAGNOSIS — Z8659 Personal history of other mental and behavioral disorders: Secondary | ICD-10-CM

## 2019-08-28 NOTE — Progress Notes (Signed)
Psychotherapy Progress Note [facility] Marliss Czar, [name]  Patient ID: Kathleen Soto     MRN: [id number] Therapy format: Individual psychotherapy Date: 08/28/2019      Start: 8:14a     Stop: 9:03a     Time Spent: 49 min Location: In-person   Session narrative (presenting needs, interim history, self-report of stressors and symptoms, applications of prior therapy, status changes, and interventions made in session) Doing OK, all considered.  Started well with being off work, then Christmas, dog died, inlaws' mountain house project, [name] complaining and reacting, and now puppy training.  Just now able to leave the puppy for a couple hrs.  All the challenges tend to spark up self-judgment as failing in life.  Still largely about unproductive, reactionary conflict with husband and the kind of labelling and demeaning he will reflexively do in the heat of battle.  Strong instinct to defend herself, explain, challenge in a friendly way even though he is hot under the collar.  Concerned otherwise for [name] getting burned out in his work, chronically feeling unappreciated, and nonexistent intimacy, and how puppy conditions have necessitated sleeping separately.    Deep benefit of the religious practice of personal confession during [place] Week.  Has been reclaiming her "happy place" (the home office, which became fraught with anxiety while she was working out the relational breakdown at work).  Rediscovered material from her hospitalization for EDO at [facility], illuminating about her persistent struggles with self-criticism.  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  Trying to reclaim value in her personality, despite hx of BPD diagnosis, e.g., through  enneagram study (Type 4, the Romantic).  Tentative development in that [name] interested is in enneagram typing himself now. Good to see him curious.  Also edifying for her to babysit her 62 mo niece, feeling value in it after working through some tendency to overcommit.  Therapeutic modalities: Cognitive Behavioral Therapy and Solution-Oriented/Positive Psychology  Mental Status/Observations:  Appearance:   Casual     Behavior:  Appropriate  Motor:  Normal  Speech/Language:   Clear and Coherent  Affect:  Appropriate  Mood:  anxious and dysthymic  Thought process:  normal  Thought content:    WNL  Sensory/Perceptual disturbances:    WNL  Orientation:  Fully oriented  Attention:  Good    Concentration:  Good  Memory:  WNL  Insight:    Good  Judgment:   Good  Impulse Control:  Good   Risk Assessment: Danger to Self: No Self-injurious Behavior: No Danger to Others: No Physical Aggression / Violence: No Duty to Warn: No Access to Firearms a concern: No  Assessment of progress:  progressing  Diagnosis:   ICD-10-CM   1. Mild episode of recurrent major depressive disorder (HCC)  F33.0   2. Relationship problem between partners  Z63.0   3. Social anxiety disorder  F40.10   4. Generalized anxiety disorder  F41.1   5. History of bulimia nervosa  Z86.59   6. History of borderline personality disorder  Z86.59    Plan:  . Continue trying to meet [name] with less defensiveness, allow him to vent, reward kindness with kindness . Endorse continued work on Educational psychologist and inspirational reading . Other recommendations/advice as may be  noted above . Continue to utilize previously learned skills ad lib . Maintain medication as prescribed and work faithfully with relevant prescriber(s) if any changes are desired or seem indicated . Call the clinic on-call service, present to ER, or call 911 if any life-threatening psychiatric crisis Return for time as available. . Already  scheduled visit in this office Visit date not found.  Blanchie Serve, PhD Luan Moore, [name] Clinical Psychologist, [facility] [address] [place], [place] [address] [phone]

## 2019-09-11 ENCOUNTER — Encounter: Payer: Self-pay | Admitting: Psychiatry

## 2019-09-11 ENCOUNTER — Ambulatory Visit (INDEPENDENT_AMBULATORY_CARE_PROVIDER_SITE_OTHER): Payer: 59 | Admitting: Psychiatry

## 2019-09-11 ENCOUNTER — Other Ambulatory Visit: Payer: Self-pay

## 2019-09-11 DIAGNOSIS — Z630 Problems in relationship with spouse or partner: Secondary | ICD-10-CM | POA: Diagnosis not present

## 2019-09-11 DIAGNOSIS — F330 Major depressive disorder, recurrent, mild: Secondary | ICD-10-CM

## 2019-09-11 DIAGNOSIS — Z8659 Personal history of other mental and behavioral disorders: Secondary | ICD-10-CM | POA: Diagnosis not present

## 2019-09-11 DIAGNOSIS — F411 Generalized anxiety disorder: Secondary | ICD-10-CM

## 2019-09-11 DIAGNOSIS — F331 Major depressive disorder, recurrent, moderate: Secondary | ICD-10-CM | POA: Diagnosis not present

## 2019-09-11 DIAGNOSIS — F4010 Social phobia, unspecified: Secondary | ICD-10-CM

## 2019-09-11 MED ORDER — BUPROPION HCL ER (XL) 300 MG PO TB24: 300.0000 mg | ORAL_TABLET | Freq: Every morning | ORAL | 1 refills | Status: DC

## 2019-09-11 MED ORDER — LAMOTRIGINE 150 MG PO TABS: 150.0000 mg | ORAL_TABLET | Freq: Every day | ORAL | 1 refills | Status: DC

## 2019-09-11 NOTE — Progress Notes (Signed)
[name] [id number] 1961/03/07 59 y.o.  Subjective:   Patient ID:  Kathleen Soto is a 59 y.o. (DOB 05/26/1960) female.  Chief Complaint:  Chief Complaint  Patient presents with  . Follow-up    h/o depression and anxiety    HPI Kathleen Soto presents to the office today for follow-up of depression and anxiety. She reports that there have been some recent stressors and saw therapist today. She reports that her mood was lower around the holidays/start of the year and then her dog died suddenly in 05/31/2022 and this was hard for her. Her mood and motivation were lower after this occurred. Reports that she is anxious about their house situation and is babysitting her niece who is 25 months old. Pt reports that she is having to set some limits. Reports that she is continuing to try to work through guilt and shame. She feels that energy and motivation remain lower and is working on this. She reports some mild persistent sadness. She reports that anxiety has been increased and notices some worry. Sleep has been disrupted due to her puppy. She reports that her sleep is disrupted due to other environmental factors. She reports that she has "not been making good decisions about eating." She reports that her weight has increased and has been less active during the pandemic. Concentration has been ok. Denies SI.   They have been contemplating building a house. Has gotten a puppy and is enjoying him, although he has been taking more effort. Reports that she is "scared to go back to work, even if I had to."   She reports that she has taken Klonopin infrequently, such as when she is staying at their mtn home alone and will take a 1/2 tab.    Son is in ranger school in the army. Her other son turns 21 tomorrow.   Past Psychiatric Medication Trials: Zoloft Paxil-sexual side effects, weight gain Luvox-ineffective Lexapro Prozac-sexual side  effects Effexor Wellbutrin Latuda Abilify-effective Lamictal Strattera BuSpar Propranolol- Helped with tremor in the past. Had light-headedness at times with it.  Remeron- Effective for insomnia Klonopin   Review of Systems:  Review of Systems  Genitourinary: Positive for pelvic pain.  Musculoskeletal: Negative for gait problem.  Neurological: Negative for tremors.  Psychiatric/Behavioral:       Please refer to HPI    Medications: I have reviewed the patient's current medications.  Current Outpatient Medications  Medication Sig Dispense Refill  . buPROPion (WELLBUTRIN XL) 300 MG 24 hr tablet Take 1 tablet (300 mg total) by mouth every morning. 90 tablet 1  . Calcium Carbonate-Vitamin D (OSCAL 500/200 D-3 PO) Take by mouth.    . clonazePAM (KLONOPIN) 0.5 MG tablet Take 1/2-1 tab po qd prn anxiety 10 tablet 5  . desloratadine (CLARINEX) 5 MG tablet Take 5 mg by mouth daily.    Marland Kitchen ezetimibe (ZETIA) 10 MG tablet Take 10 mg by mouth daily.    . Famotidine (PEPCID PO) Take by mouth 2 (two) times daily.    Marland Kitchen levalbuterol (XOPENEX HFA) 45 MCG/ACT inhaler Inhale into the lungs every 4 (four) hours as needed for wheezing.    Marland Kitchen levocetirizine (XYZAL) 5 MG tablet Take 5 mg by mouth every evening.    . Levothyroxine Sodium 100 MCG CAPS Take 100 mcg by mouth daily.     . montelukast (SINGULAIR) 10 MG tablet Take 10 mg by mouth at bedtime.    . Omalizumab (XOLAIR [place]) Inject into the skin.    Marland Kitchen  pantoprazole (PROTONIX) 40 MG tablet Take 40 mg by mouth daily.    Marland Kitchen lamoTRIgine (LAMICTAL) 150 MG tablet Take 1 tablet (150 mg total) by mouth daily. 90 tablet 1  . levalbuterol (XOPENEX) 0.63 MG/3ML nebulizer solution Take 3 mLs (0.63 mg total) by nebulization every 6 (six) hours as needed for wheezing or shortness of breath. 30 mL 1  . methocarbamol (ROBAXIN) 500 MG tablet Take 500 mg by mouth every 6 (six) hours as needed for muscle spasms.     No current facility-administered medications for  this visit.    Medication Side Effects: None  Allergies:  Allergies  Allergen Reactions  . Prednisone     Esophageal spasms  . Sulfonamide Derivatives     REACTION: mouth ulcers    Past Medical History:  Diagnosis Date  . Depression   . GERD (gastroesophageal reflux disease)   . Hyperlipidemia   . Thyroid disease   . Urticaria, chronic     Family History  Problem Relation Age of Onset  . Thyroid disease Sister   . Cancer Father   . Thyroid disease Father   . Depression Father   . Cancer Brother   . Parkinson's disease Mother   . Alcohol abuse Paternal Grandfather   . Alcohol abuse Sister     Social History   Socioeconomic History  . Marital status: Single    Spouse name: Not on file  . Number of children: Not on file  . Years of education: Not on file  . Highest education level: Not on file  Occupational History  . Not on file  Tobacco Use  . Smoking status: Never Smoker  . Smokeless tobacco: Never Used  Substance and Sexual Activity  . Alcohol use: Yes    Comment: rarely  . Drug use: No  . Sexual activity: Not on file  Other Topics Concern  . Not on file  Social History Narrative  . Not on file   Social Determinants of Health   Financial Resource Strain:   . Difficulty of Paying Living Expenses:   Food Insecurity:   . Worried About Programme researcher, broadcasting/film/video in the Last Year:   . Barista in the Last Year:   Transportation Needs:   . Freight forwarder (Medical):   Marland Kitchen Lack of Transportation (Non-Medical):   Physical Activity:   . Days of Exercise per Week:   . Minutes of Exercise per Session:   Stress:   . Feeling of Stress :   Social Connections:   . Frequency of Communication with Friends and Family:   . Frequency of Social Gatherings with Friends and Family:   . Attends Religious Services:   . Active Member of Clubs or Organizations:   . Attends Banker Meetings:   Marland Kitchen Marital Status:   Intimate Partner Violence:   .  Fear of Current or Ex-Partner:   . Emotionally Abused:   Marland Kitchen Physically Abused:   . Sexually Abused:     Past Medical History, Surgical history, Social history, and Family history were reviewed and updated as appropriate.   Please see review of systems for further details on the patient's review from today.   Objective:   Physical Exam:  There were no vitals taken for this visit.  Physical Exam Constitutional:      General: She is not in acute distress. Musculoskeletal:        General: No deformity.  Neurological:     Mental Status: She  is alert and oriented to person, place, and time.     Coordination: Coordination normal.  Psychiatric:        Attention and Perception: Attention and perception normal. She does not perceive auditory or visual hallucinations.        Mood and Affect: Mood is anxious. Affect is not labile, blunt, angry or inappropriate.        Speech: Speech normal.        Behavior: Behavior normal.        Thought Content: Thought content normal. Thought content is not paranoid or delusional. Thought content does not include homicidal or suicidal ideation. Thought content does not include homicidal or suicidal plan.        Cognition and Memory: Cognition and memory normal.        Judgment: Judgment normal.     Comments: Insight intact Mood presents as mildly depressed. h      Lab Review:  No results found for: NA, K, CL, CO2, GLUCOSE, BUN, CREATININE, CALCIUM, PROT, ALBUMIN, AST, ALT, ALKPHOS, BILITOT, GFRNONAA, GFRAA  No results found for: WBC, RBC, HGB, HCT, PLT, MCV, MCH, MCHC, RDW, LYMPHSABS, MONOABS, EOSABS, BASOSABS  No results found for: POCLITH, LITHIUM   No results found for: PHENYTOIN, PHENOBARB, VALPROATE, CBMZ   .res Assessment: Plan:   Pt reports that she would prefer to continue current plan of care without changes since she attributes current s/s to psychosocial stressors and is addressing stressors.  Continue Wellbutrin XL 300 mg po qd for  depression.  Continue Lamictal 150 mg po qd for depression. Continue Klonopin prn anxiety. Pt has refills remaining. Recommended that she contact pharmacy when current refills expire and a new script can be submitted. Recommend continuing psychotherapy with Luan Moore, PhD. Pt to f/u in 6 months or sooner if clinically indicated.  Patient advised to contact office with any questions, adverse effects, or acute worsening in signs and symptoms.  Kathleen Soto was seen today for follow-up.  Diagnoses and all orders for this visit:  Social anxiety disorder  Mild episode of recurrent major depressive disorder (HCC) -     buPROPion (WELLBUTRIN XL) 300 MG 24 hr tablet; Take 1 tablet (300 mg total) by mouth every morning. -     lamoTRIgine (LAMICTAL) 150 MG tablet; Take 1 tablet (150 mg total) by mouth daily.  Generalized anxiety disorder     Please see After Visit Summary for patient specific instructions.  Future Appointments  Date Time Provider [place]  09/18/2019 10:00 AM Blanchie Serve, PhD [facility] None  03/11/2020  9:30 AM Thayer Headings, PMHNP CP[facility] None    No orders of the defined types were placed in this encounter.   -------------------------------

## 2019-09-11 NOTE — Progress Notes (Signed)
Psychotherapy Progress Note [facility] 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  Backdrop of feeling some awe for two of the women who are divorced, guilt trip seeing them handle jobs they have to keep.  Hearing from one Kathleen Soto) about a perceived screwup at work was very triggering for PT.  Shaming and worrisome mulling over gratitude for Kathleen Soto enabling her to be out of the work force right now, intensifying for seeing more passive-aggressive behavior on his part, grousing about things she gets to do and allegations of selfishness on her part that finally made sense to her (e.g., planning two weekends away for herself without considering his fatigue, work schedule, responsibility with the puppy.  Decidedly not focused right now on Kathleen Soto's tendencies to harangue, monologue, exaggerate because it hit her that she has gotten overfocused on her own feelings and wishes and did overlook his.  Realized during a text fight, tried to offer "You're right" messages, only on reflection she also added "but" and the message that she needs him to give her grace while she's "trying to change".    Supportively confronted both her mixed messaging and her abiding tendencies to all-or-nothing, personalizing, and self-castigation.  Coached in reconciling  communication tactics, primarily use of the word "but", advising her to either "but out" (hold her request/complaint until he has de-escalated) or "turn your but around" (say the "friendly part" last, e.g., "I need you to give me grace BUT you're right, I didn't think of what you need.").  Agrees, Kathleen Soto try.    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  Has been able to express these priorities and propose a limited flexibility to increase her hours.  Affirmed as thoughtful, not "selfish", and an unheralded act of looking out for her husband.  Figures she may need to do more of what Kathleen Soto likes/wants, to help show she takes him seriously about the charge of selfishness, e.g., help with a [employer] event.  Worry for son Kathleen Soto, going back into [employer] training, which brings up fears of him dying in a training accident or eventual deployment.  Been reading [facility] he has asked her to read in order to be able to share his own interests, despite being squeamish about the material.  It has meant she can have more stirring conversation with him, which he wanted, and pointed out it is another act of unselfishness, risking images and feelings she does not want in order to go where her son's interests are.  Son Kathleen Soto turns 21 tomorrow, in [place], just turned down by Kathleen Soto on a technicality with paperwork.  Supportive.  As hurts go, the "final blow" that her favorite [place] retreat is being sold, the one where she went during the work breakdown, pandemic, and other times of great need for retreat.  Deeply moved by the appointments there and the deep, spiritual wind chimes there.  Next weekend meeting Kathleen Soto's future inlaws there.    Therapeutic  modalities: Cognitive Behavioral Therapy and Solution-Oriented/Positive Psychology  Mental Status/Observations:  Appearance:   Casual     Behavior:  Appropriate  Motor:  Normal  Speech/Language:   Clear and Coherent  Affect:  Appropriate  Mood:  depressed and shamed  Thought process:  normal  Thought content:    WNL  Sensory/Perceptual disturbances:    WNL  Orientation:  Fully oriented  Attention:  Good    Concentration:  Good  Memory:  WNL  Insight:    Good  Judgment:   Good  Impulse Control:  Good   Risk Assessment: Danger to Self: No Self-injurious Behavior: No Danger to Others: No Physical Aggression / Violence: No Duty to Warn: No Access to Firearms a concern: No  Assessment of progress:  situational setback(s)  Diagnosis:   ICD-10-CM   1. Moderate episode of recurrent major depressive disorder (HCC)  F33.1   2. Social anxiety disorder  F40.10   3. Relationship problem between partners  Z63.0   4. History of bulimia nervosa  Z86.59    Plan:  . Change use of the word "but" with Kathleen Soto -- maybe let him know it's a specific thing she figured out to try to change . Encourage do offer some time doing things that are more his interest and try to check with him before making unilateral plans for the weekend . Other recommendations/advice as may be noted above . Continue to utilize previously learned skills ad lib . Maintain medication as prescribed and work faithfully with relevant prescriber(s) if any changes are desired or seem indicated . Call the clinic on-call service, present to ER, or call 911 if any life-threatening psychiatric crisis Return in about 1 week (around 09/18/2019) for session(s) already scheduled. . Already scheduled visit in this office 09/11/2019.  Robley Fries, PhD Marliss Czar, [name] Clinical Psychologist, [facility] [address], [address] [place], [place] [address] [phone]

## 2019-09-18 ENCOUNTER — Other Ambulatory Visit: Payer: Self-pay

## 2019-09-18 ENCOUNTER — Ambulatory Visit (INDEPENDENT_AMBULATORY_CARE_PROVIDER_SITE_OTHER): Payer: 59 | Admitting: Psychiatry

## 2019-09-18 DIAGNOSIS — Z630 Problems in relationship with spouse or partner: Secondary | ICD-10-CM | POA: Diagnosis not present

## 2019-09-18 DIAGNOSIS — F331 Major depressive disorder, recurrent, moderate: Secondary | ICD-10-CM

## 2019-09-18 DIAGNOSIS — F4010 Social phobia, unspecified: Secondary | ICD-10-CM

## 2019-09-18 NOTE — Progress Notes (Signed)
Psychotherapy Progress Note [facility] Marliss Czar, [name]  Patient ID: KEILEE DENMAN     MRN: [id number] Therapy format: Individual psychotherapy Date: 09/18/2019      Start: 10:11a     Stop: 11:01a     Time Spent: 50 min Location: In-person   Session narrative (presenting needs, interim history, self-report of stressors and symptoms, applications of prior therapy, status changes, and interventions made in session) "I think I've broken my husband."  [name] says he's feeling unappreciated to his core, 28 years worth, and a lot of reactions pushing back on this.  She has been trying to show care/concern but he keeps having resentful outbreaks, seemingly punishing her regardless of what she says.  Got her to ask if he is through with her, wants to break up.  No, but impatient for results, best she can tell, and she is apt to pop out with apologies and pledges to do better or "change".  Hx of seeing her father take mother to task a lot, knows she does not want to let that happen.    Discussed interpretation of [name] reactions and mood, advised value in letting him vent and making sure not to move too quickly to assuage him, as this, too can be taken as trying to control his feelings and stifle his right to express himself.  Advised when able to try to move conflict conversations into what they each want, more than what they dislike or feel they need to defend.  Advised to shortcut her own urge to defend herself, since the reaction itself will speak louder than any logic.  Therapeutic modalities: Cognitive Behavioral Therapy, Solution-Oriented/Positive Psychology and Assertiveness/Communication  Mental Status/Observations:  Appearance:   Casual     Behavior:  Appropriate  Motor:  Normal  Speech/Language:   Clear and Coherent  Affect:  Appropriate  Mood:  anxious and sad  Thought process:  normal  Thought content:    WNL  Sensory/Perceptual disturbances:    WNL   Orientation:  Fully oriented  Attention:  Good    Concentration:  Good  Memory:  WNL  Insight:    Good  Judgment:   Good  Impulse Control:  Fair   Risk Assessment: Danger to Self: No Self-injurious Behavior: No Danger to Others: No Physical Aggression / Violence: No Duty to Warn: No Access to Firearms a concern: No  Assessment of progress:  situational setback(s)  Diagnosis:   ICD-10-CM   1. Moderate episode of recurrent major depressive disorder (HCC)  F33.1   2. Relationship problem between partners  Z63.0   3. Social anxiety disorder  F40.10    Plan:  . Priority attention to goodwill, letting H vent, showing freedom to speak.  When able, move conflict conversation to what each want more than what bothers them. . Other recommendations/advice as may be noted above . Continue to utilize previously learned skills ad lib . Maintain medication as prescribed and work faithfully with relevant prescriber(s) if any changes are desired or seem indicated . Call the clinic on-call service, present to ER, or call 911 if any life-threatening psychiatric crisis Return in about 3 weeks (around 10/09/2019).  Robley Fries, PhD Marliss Czar, [name] Clinical Psychologist, [facility] [address], [address] [place], [place] [address] [phone]

## 2019-10-08 ENCOUNTER — Ambulatory Visit (INDEPENDENT_AMBULATORY_CARE_PROVIDER_SITE_OTHER): Payer: 59 | Admitting: Psychiatry

## 2019-10-08 ENCOUNTER — Other Ambulatory Visit: Payer: Self-pay

## 2019-10-08 DIAGNOSIS — F4010 Social phobia, unspecified: Secondary | ICD-10-CM | POA: Diagnosis not present

## 2019-10-08 DIAGNOSIS — F331 Major depressive disorder, recurrent, moderate: Secondary | ICD-10-CM

## 2019-10-08 DIAGNOSIS — Z8659 Personal history of other mental and behavioral disorders: Secondary | ICD-10-CM

## 2019-10-08 DIAGNOSIS — Z630 Problems in relationship with spouse or partner: Secondary | ICD-10-CM | POA: Diagnosis not present

## 2019-10-08 NOTE — Progress Notes (Signed)
Psychotherapy Progress Note [facility] Marliss Czar, [name]  Patient ID: Kathleen Soto     MRN: [id number] Therapy format: Individual psychotherapy Date: 10/08/2019      Start: 2:10p     Stop: 3:00p     Time Spent: 50 min Location: In-person   Session narrative (presenting needs, interim history, self-report of stressors and symptoms, applications of prior therapy, status changes, and interventions made in session) Moved up appointment due to schedule conflict.  Some warming between PT and H.  PT making concerted effort to see things from his perspective, make available to things he wants to do.  Even watching wrestling now and then with him, and have come to share "[employer]", which was only her thing before.  Sees H responding to enneagram coaching (as opposed to "therapy") with a student they have engaged about relating to each other's types.  Serendipitous discovery when PT was delayed getting there and H was deep into getting out his own stresses with work and alcoholic family.  Pleasant trip to beach, incl. [name] birthday.    [name] has brought up again his deep wish to settle in the mountain house in [place], [place].  PT torn, wants to respect strength of [name] feelings, but it's delicate, since she does not want to go there and be away from all that is familiar and enjoyed.  Reviewed pros/cons around building vs. awaiting the inherited mountain home, and affirmed strategy of bringing out principles more than who is for/against, and coming up with compromise.    Therapeutic modalities: Cognitive Behavioral Therapy, Solution-Oriented/Positive Psychology and Assertiveness/Communication  Mental Status/Observations:  Appearance:   Casual     Behavior:  Appropriate  Motor:  Normal  Speech/Language:   Clear and Coherent  Affect:  Appropriate  Mood:  anxious and less  Thought process:  normal  Thought content:    WNL  Sensory/Perceptual disturbances:    WNL   Orientation:  Fully oriented  Attention:  Good    Concentration:  Good  Memory:  WNL  Insight:    Good  Judgment:   Good  Impulse Control:  Good   Risk Assessment: Danger to Self: No Self-injurious Behavior: No Danger to Others: No Physical Aggression / Violence: No Duty to Warn: No Access to Firearms a concern: No  Assessment of progress:  progressing  Diagnosis:   ICD-10-CM   1. Moderate episode of recurrent major depressive disorder (HCC)  F33.1   2. Relationship problem between partners  Z63.0   3. Social anxiety disorder  F40.10   4. History of bulimia nervosa  Z86.59    Plan:  . Continue seeking shared pleasant activities . Keep working conflict to the positive, nonpersonal . Other recommendations/advice as may be noted above . Continue to utilize previously learned skills ad lib . Maintain medication as prescribed and work faithfully with relevant prescriber(s) if any changes are desired or seem indicated . Call the clinic on-call service, present to ER, or call 911 if any life-threatening psychiatric crisis Return for time as available. . Already scheduled visit in this office 11/04/2019.  Robley Fries, PhD Marliss Czar, [name] Clinical Psychologist, [facility] [address] [place], [place] [address] [phone]

## 2019-10-18 ENCOUNTER — Ambulatory Visit: Payer: 59 | Admitting: Psychiatry

## 2019-11-04 ENCOUNTER — Other Ambulatory Visit: Payer: Self-pay

## 2019-11-04 ENCOUNTER — Ambulatory Visit (INDEPENDENT_AMBULATORY_CARE_PROVIDER_SITE_OTHER): Payer: 59 | Admitting: Psychiatry

## 2019-11-04 DIAGNOSIS — F330 Major depressive disorder, recurrent, mild: Secondary | ICD-10-CM

## 2019-11-04 DIAGNOSIS — Z8659 Personal history of other mental and behavioral disorders: Secondary | ICD-10-CM

## 2019-11-04 DIAGNOSIS — Z630 Problems in relationship with spouse or partner: Secondary | ICD-10-CM | POA: Diagnosis not present

## 2019-11-04 NOTE — Progress Notes (Signed)
Psychotherapy Progress Note [facility] Marliss Czar, [name]  Patient ID: Kathleen Soto     MRN: [id number] Therapy format: Individual psychotherapy Date: 11/04/2019      Start: 1:11p     Stop: 2:01p     Time Spent: 50 min Location: In-person   Session narrative (presenting needs, interim history, self-report of stressors and symptoms, applications of prior therapy, status changes, and interventions made in session) Guarded concern right now about her wellbeing at home, feels she's lost control of the house again amid a collection of urgent needs and her own loss of diligence.  Had the house mastered when she had a clear schedule what was done when.  Trying to re-adopt her old plan, adjusting for babysitting work.  Making headway on proofreading course.  Certain appealing TV has been chewing up time, and she has been gaining weight from snacking.  Knows what she can do about that (bulimia) but clear not to let herself go back there.  Educated about new thinking on energy regulation, carb control, and exercise "snacking".  [name] seems to be more favorable about building a house now, back toward the old plan and not hung up on moving too far away to fulfill the family wish that they set up house in the parents home in [place].  [place], very emotional for knowing it was being sold.  Impression of the new owners that they are about to lay waste to the atmosphere she/they so loved.  Compounded by it being the place their old dog CJ spent his last months.  50 days spent in 2 years there, became regulars, almost family.  Stories shared of things she enjoyed, bond with the owners, indications that the new owners will spoil it.  Discussed intentions and perspective toward the disciplines of housework and weight loss.   Affirmed and encouraged.  Therapeutic modalities: Cognitive Behavioral Therapy and Solution-Oriented/Positive  Psychology  Mental Status/Observations:  Appearance:   Casual     Behavior:  Appropriate  Motor:  Normal  Speech/Language:   Clear and Coherent  Affect:  Appropriate  Mood:  anxious and sad  Thought process:  normal  Thought content:    WNL  Sensory/Perceptual disturbances:    WNL  Orientation:  Fully oriented  Attention:  Good    Concentration:  Good  Memory:  WNL  Insight:    Good  Judgment:   Good  Impulse Control:  Fair   Risk Assessment: Danger to Self: No Self-injurious Behavior: No Danger to Others: No Physical Aggression / Violence: No Duty to Warn: No Access to Firearms a concern: No  Assessment of progress:  stabilized  Diagnosis:   ICD-10-CM   1. Mild episode of recurrent major depressive disorder (HCC)  F33.0   2. Relationship problem between partners  Z63.0   3. History of bulimia nervosa  Z86.59    Plan:  . Work on Teacher, early years/pre and using food timing, calorie reallocation, and exercise "snacking" to help curb food temptations . Continue working on low-reactivity, listening-first approach to husband's tensions  . Other recommendations/advice as may be noted above . Continue to utilize previously learned skills ad lib . Maintain medication as prescribed and work faithfully with relevant prescriber(s) if any changes are desired or seem indicated . Call the clinic on-call service, present to ER, or call 911 if any life-threatening psychiatric crisis Return for session(s) already scheduled, time at discretion. . Already scheduled visit  in this office 11/27/2019.  Blanchie Serve, PhD Luan Moore, [name] Clinical Psychologist, [facility] [address], [place], [place] [address] [phone]

## 2019-11-13 ENCOUNTER — Ambulatory Visit (INDEPENDENT_AMBULATORY_CARE_PROVIDER_SITE_OTHER): Payer: 59 | Admitting: Psychiatry

## 2019-11-13 ENCOUNTER — Other Ambulatory Visit: Payer: Self-pay

## 2019-11-13 DIAGNOSIS — Z8659 Personal history of other mental and behavioral disorders: Secondary | ICD-10-CM

## 2019-11-13 DIAGNOSIS — F330 Major depressive disorder, recurrent, mild: Secondary | ICD-10-CM | POA: Diagnosis not present

## 2019-11-13 DIAGNOSIS — F4010 Social phobia, unspecified: Secondary | ICD-10-CM

## 2019-11-13 NOTE — Progress Notes (Signed)
Psychotherapy Progress Note [facility] Marliss Czar, [name]  Patient ID: Kathleen Soto     MRN: [id number] Therapy format: Individual psychotherapy Date: 11/13/2019      Start: 3:15p     Stop: 4:05p     Time Spent: 50 min Location: In-person   Session narrative (presenting needs, interim history, self-report of stressors and symptoms, applications of prior therapy, status changes, and interventions made in session) Son Kathleen Soto is in [employer], probably graduating in 2 weeks.  Has had to rearrange therapy schedule, but apparently unrelated, feeling a depressive turn past week or so, dysphoric for no apparent reason.  Wondering if it is thyroid.  Kathleen Soto had a grumpy episode, identified as a 2-day failure to take his Vyvanse.  Aware of feeling overwhelmed, house more out of whack.  If not for the dog, would be in the bed.  Admits eating poorly, carb-loaded.  Kathleen Soto was good, tried to get her to laugh, encouraged in making a plan to feel better.  Found herself grieving much more deeply than she expected the inn changing hands, and recalling loss of mother.  Realized in counseling with her enneagram mentor that the place has been deep sanctuary, a sacred place, the place where like no other she can feel what she feels and be herself, and not worry about being "too much".  Feels threatening, too, that she might regress psychologically without it.   Assured it does not have to mean she Kathleen Soto regress, that grief makes [place] feel weak, and she is still sensitized to perceived weakness, still prone to emotion-shame herself.  Reframed that we grieve like we love, and she loves thoroughly including this place, which has been more like a family member and an adoptive home to her, as well as a place of rare emotional freedom, no small thing to part with.  Unlike a death in the family, no body to bury, no funeral to be had, customarily, but she could if she wishes have a service of remembrance or a  celebration of life somehow.  Is already working on a pictorial tribute, with words from friends who have enjoyed it with her.  Shared part of a poem written about it.  Feeling some resolved by the end of the session, may keep scheduled time next Friday.   Feeling less like she needs to medicate it, but does resolve to hydrate, stretch, and detox some more from carbs, since sugar and lack of water may exaggerate malaise.  Therapeutic modalities: Cognitive Behavioral Therapy, Solution-Oriented/Positive Psychology, Humanistic/Existential and Grief Therapy  Mental Status/Observations:  Appearance:   Casual     Behavior:  Appropriate  Motor:  Normal  Speech/Language:   Clear and Coherent  Affect:  Appropriate  Mood:  sad, responsive  Thought process:,  normal  Thought content:    WNL and worries  Sensory/Perceptual disturbances:    WNL  Orientation:  Fully oriented  Attention:  Good    Concentration:  Good  Memory:  WNL  Insight:    Good  Judgment:   Good  Impulse Control:  Fair   Risk Assessment: Danger to Self: No Self-injurious Behavior: No Danger to Others: No Physical Aggression / Violence: No Duty to Warn: No Access to Firearms a concern: No  Assessment of progress:  situational setback(s)  Diagnosis:   ICD-10-CM   1. Mild episode of recurrent major depressive disorder (HCC)  F33.0   2. Social anxiety disorder  F40.10   3. History of  bulimia nervosa  Z86.59    Plan:  . Continue with tribute to the inn as expressive therapy . Option to create a ceremony or just gather other inn-lovers for something akin to a wake . Social support otherwise, as moved . Hydrate and stretch well to offset grief-induced physiological neglect, and detox further from carb loading . Other recommendations/advice as may be noted above . Continue to utilize previously learned skills ad lib . Maintain medication as prescribed and work faithfully with relevant prescriber(s) if any changes are desired  or seem indicated . Call the clinic on-call service, present to ER, or call 911 if any life-threatening psychiatric crisis Return for session(s) already scheduled. . Already scheduled visit in this office 11/22/2019.  Robley Fries, PhD Marliss Czar, [name] Clinical Psychologist, [facility] [address], [address] [place], [place] [address] [phone]

## 2019-11-22 ENCOUNTER — Ambulatory Visit (INDEPENDENT_AMBULATORY_CARE_PROVIDER_SITE_OTHER): Payer: 59 | Admitting: Psychiatry

## 2019-11-22 ENCOUNTER — Telehealth: Payer: Self-pay | Admitting: Psychiatry

## 2019-11-22 DIAGNOSIS — F330 Major depressive disorder, recurrent, mild: Secondary | ICD-10-CM

## 2019-11-22 DIAGNOSIS — Z8659 Personal history of other mental and behavioral disorders: Secondary | ICD-10-CM

## 2019-11-22 DIAGNOSIS — F4010 Social phobia, unspecified: Secondary | ICD-10-CM

## 2019-11-22 NOTE — Progress Notes (Signed)
Psychotherapy Progress Note [facility] Luan Moore, [name]  Patient ID: Kathleen Soto     MRN: [id number] Therapy format: Individual psychotherapy Date: 11/22/2019      Start: 9:11a     Stop: 10:01a     Time Spent: 50 min Location: Telehealth visit -- I connected with this patient by an approved telecommunication method (video), with her informed consent, and verifying identity and patient privacy.  I was located at my office and patient at her home.  As needed, we discussed the limitations, risks, and security and privacy concerns associated with telehealth service, including the availability and conditions which currently govern in-person appointments and the possibility that 3rd-party payment may not be fully guaranteed and she may be responsible for charges.  After she indicated understanding, we proceeded with the session.  Also discussed treatment planning, as needed, including ongoing verbal agreement with the plan, the opportunity to ask and answer all questions, her demonstrated understanding of instructions, and her readiness to call the office should symptoms worsen or she feels she is in a crisis state and needs more immediate and tangible assistance.   Technical difficulties with PT's mic not working.    Session narrative (presenting needs, interim history, self-report of stressors and symptoms, applications of prior therapy, status changes, and interventions made in session) Video session [place].  Better overall, been at [place] house for a week now, 4 days with inlaws, 3 of those by herself.  Got stimulus to exercise walking the dog for having to witness dynamics between them.  Inlaws left a little early due to FIL getting irritable with MIL's mental and physical pace and turning condescending, plus his own physical suffering.  [name] came up, was tired and irritable himself.  Caught herself whining a bit and  walked it back.  Glad to have met neighbors better and to be more active.  Eating more intentionally, hydrating better, feeling more normal.  Fitness watch helping her stay more intentional.  Been some more concrete talk about building the house of their intentions, though it may involve a transitional stay in the mountain house.    Re grieving the inn, got in with her spiritual supports -- Oceanographer and her Product manager, who also picked up on her strong tendency to shame herself for her emotions, offered her a "shame bowl" to put those thoughts in, also suggested doing a funeral of some kind for the inn.  PT has considered doing a series of photo collections.  Noted now welcome the place has been for spiritual time and for the exquisite service received there.  Finds herself less overwhelmed with it.  Affirmed multiple efforts to care for self, be hospitality to herself.  Also affirmed making the turn to like aspects of her the inlaws' mountain house, in spite of grief for the inn and latent risk of [name] turning back to wanting to settle down there.  It does help that she has found friendly neighbors.    Resolved to continue with intentional meal planning, work through her proofreading course, take care of nursing CEUs, and work through picture tributes to the inn and [place] the dog.  Concern for reentry at home, since it's messy there and much unwanted cleaning up to do.  Reviewed motivational tips.  Therapeutic modalities: Cognitive Behavioral Therapy, Solution-Oriented/Positive Psychology, Humanistic/Existential and Faith-sensitive  Mental Status/Observations:  Appearance:   Casual     Behavior:  Appropriate  °Motor:  Normal  °Speech/Language:   Clear and Coherent  °Affect:  Appropriate and pleasant  °Mood:  normal  °Thought process:  normal  °Thought content:    WNL  °Sensory/Perceptual disturbances:    WNL  °Orientation:  Fully oriented  °Attention:  Good  °  °Concentration:  Good   °Memory:  WNL  °Insight:    Good  °Judgment:   Good  °Impulse Control:  Good  ° °Risk Assessment: °Danger to Self: No Self-injurious Behavior: No °Danger to Others: No Physical Aggression / Violence: No °Duty to Warn: No Access to Firearms a concern: No ° °Assessment of progress:  progressing well ° °Diagnosis: °  ICD-10-CM   °1. Mild episode of recurrent major depressive disorder (HCC)  F33.0   °2. Social anxiety disorder  F40.10   °3. History of bulimia nervosa  Z86.59   ° °Plan:  °• Resolved to continue with intentional meal planning, work through her proofreading course, take care of nursing CEUs, and work through picture tributes to the inn and [place] the dog °• Other recommendations/advice as may be noted above °• Continue to utilize previously learned skills ad lib °• Maintain medication as prescribed and work faithfully with relevant prescriber(s) if any changes are desired or seem indicated °• Call the clinic on-call service, present to ER, or call 911 if any life-threatening psychiatric crisis °Return for session(s) already scheduled. °• Already scheduled visit in this office 12/25/2019. ° °Robert Mitchum, PhD °Andy Mitchum, [name] °Clinical Psychologist, [facility] °[facility] °[address], [address] °[place], [place] [address] °(o) [phone] °

## 2019-11-22 NOTE — Telephone Encounter (Signed)
Ms. Kathleen, Soto are scheduled for a virtual visit with your provider today.    Just as we do with appointments in the office, we must obtain your consent to participate.  Your consent will be active for this visit and any virtual visit you may have with one of our providers in the next 365 days.    If you have a MyChart account, I can also send a copy of this consent to you electronically.  All virtual visits are billed to your insurance company just like a traditional visit in the office.  As this is a virtual visit, video technology does not allow for your provider to perform a traditional examination.  This may limit your provider's ability to fully assess your condition.  If your provider identifies any concerns that need to be evaluated in person or the need to arrange testing such as labs, EKG, etc, we will make arrangements to do so.    Although advances in technology are sophisticated, we cannot ensure that it will always work on either your end or our end.  If the connection with a video visit is poor, we may have to switch to a telephone visit.  With either a video or telephone visit, we are not always able to ensure that we have a secure connection.   I need to obtain your verbal consent now.   Are you willing to proceed with your visit today?   Kathleen Soto has provided verbal consent on 11/22/2019 for a virtual visit (video or telephone).   Robley Fries, PhD 11/22/2019  10:10 AM

## 2019-11-27 ENCOUNTER — Ambulatory Visit: Payer: 59 | Admitting: Psychiatry

## 2019-12-04 ENCOUNTER — Telehealth: Payer: Self-pay | Admitting: Psychiatry

## 2019-12-04 DIAGNOSIS — F331 Major depressive disorder, recurrent, moderate: Secondary | ICD-10-CM

## 2019-12-04 NOTE — Telephone Encounter (Signed)
Patient called and left a message that she is more depressed and  She feels like the her medications are not working. She would like  To add lamictal or another lower dose of welbutrin. She said that she will not be available by phone but you can leave a message for her on the phone. She would like a 90 day supply sent to the [facility] in [place]. Her phone number is [phone]

## 2019-12-06 MED ORDER — BUPROPION HCL ER (XL) 150 MG PO TB24: 150.0000 mg | ORAL_TABLET | Freq: Every day | ORAL | 0 refills | Status: DC

## 2019-12-06 NOTE — Telephone Encounter (Signed)
Patient left a message about answering Kathleen Soto question. She said that she takes wellbutrin xl 300 mg 1 x d and lamotrigine 150 mg 1 x d. She said that she is depressed and that she would like to add another welbutrin she knows she can go up to 400 mg or increase the lamictal to 200 mg. She would prefer the welbutrin but will do what ever [name] decides. She wants something activating.

## 2019-12-09 NOTE — Telephone Encounter (Signed)
Left detailed information and to call back with further questions or concerns 

## 2019-12-25 ENCOUNTER — Other Ambulatory Visit: Payer: Self-pay

## 2019-12-25 ENCOUNTER — Ambulatory Visit (INDEPENDENT_AMBULATORY_CARE_PROVIDER_SITE_OTHER): Payer: 59 | Admitting: Psychiatry

## 2019-12-25 ENCOUNTER — Ambulatory Visit: Payer: 59 | Admitting: Psychiatry

## 2019-12-25 DIAGNOSIS — F4010 Social phobia, unspecified: Secondary | ICD-10-CM | POA: Diagnosis not present

## 2019-12-25 DIAGNOSIS — Z630 Problems in relationship with spouse or partner: Secondary | ICD-10-CM

## 2019-12-25 DIAGNOSIS — F331 Major depressive disorder, recurrent, moderate: Secondary | ICD-10-CM

## 2019-12-25 DIAGNOSIS — Z8659 Personal history of other mental and behavioral disorders: Secondary | ICD-10-CM | POA: Diagnosis not present
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
## 2020-01-14 ENCOUNTER — Other Ambulatory Visit: Payer: Self-pay

## 2020-01-14 ENCOUNTER — Ambulatory Visit (INDEPENDENT_AMBULATORY_CARE_PROVIDER_SITE_OTHER): Payer: 59 | Admitting: Psychiatry

## 2020-01-14 DIAGNOSIS — F4010 Social phobia, unspecified: Secondary | ICD-10-CM | POA: Diagnosis not present

## 2020-01-14 DIAGNOSIS — Z630 Problems in relationship with spouse or partner: Secondary | ICD-10-CM | POA: Diagnosis not present

## 2020-01-14 DIAGNOSIS — F411 Generalized anxiety disorder: Secondary | ICD-10-CM | POA: Diagnosis not present

## 2020-01-14 DIAGNOSIS — F331 Major depressive disorder, recurrent, moderate: Secondary | ICD-10-CM

## 2020-01-14 DIAGNOSIS — Z8659 Personal history of other mental and behavioral disorders: Secondary | ICD-10-CM
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
## 2020-01-29 ENCOUNTER — Other Ambulatory Visit: Payer: Self-pay

## 2020-01-29 ENCOUNTER — Ambulatory Visit (INDEPENDENT_AMBULATORY_CARE_PROVIDER_SITE_OTHER): Payer: 59 | Admitting: Psychiatry

## 2020-01-29 DIAGNOSIS — F4010 Social phobia, unspecified: Secondary | ICD-10-CM | POA: Diagnosis not present

## 2020-01-29 DIAGNOSIS — F411 Generalized anxiety disorder: Secondary | ICD-10-CM | POA: Diagnosis not present

## 2020-01-29 DIAGNOSIS — Z630 Problems in relationship with spouse or partner: Secondary | ICD-10-CM

## 2020-01-29 DIAGNOSIS — Z8659 Personal history of other mental and behavioral disorders: Secondary | ICD-10-CM | POA: Diagnosis not present
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
## 2020-02-12 ENCOUNTER — Other Ambulatory Visit: Payer: Self-pay

## 2020-02-12 ENCOUNTER — Ambulatory Visit (INDEPENDENT_AMBULATORY_CARE_PROVIDER_SITE_OTHER): Payer: 59 | Admitting: Psychiatry

## 2020-02-12 DIAGNOSIS — F4010 Social phobia, unspecified: Secondary | ICD-10-CM

## 2020-02-12 DIAGNOSIS — F411 Generalized anxiety disorder: Secondary | ICD-10-CM

## 2020-02-12 DIAGNOSIS — Z630 Problems in relationship with spouse or partner: Secondary | ICD-10-CM | POA: Diagnosis not present

## 2020-02-12 DIAGNOSIS — Z8659 Personal history of other mental and behavioral disorders: Secondary | ICD-10-CM

## 2020-02-12 NOTE — Progress Notes (Signed)
Psychotherapy Progress Note [facility] Marliss Czar, [name]  Patient ID: INDIYA IZQUIERDO     MRN: [id number] Therapy format: Individual psychotherapy Date: 02/12/2020      Start: 8:13a     Stop: 9:03a     Time Spent: 50 min Location: In-person   Session narrative (presenting needs, interim history, self-report of stressors and symptoms, applications of prior therapy, status changes, and interventions made in session) Immediately offers that she feels stupid for trying to contact [place] about discomfort with diagnoses noticed in my chart from last session.  PT made use of email previously used to send video invitations earlier in the pandemic to express emotional conflict and partial objection to Social Anxiety Disorder and more importantly history of Borderline Personality Disorder.  As pledged in reply, discussed open-minded later this morning the implications, ramifications and stigmatized feelings she deals with, as well as shame over even mentioning it or having a problem with it, and using the word "betrayal" to describe the feeling, not the meaning, of seeing those terms.  Inventoried PT's experiences with diagnosis and treatment including history of 1 or more psychiatrists looking into medication and deciding she must have a diagnosis, or hearing an isolated symptom and making an erroneous diagnosis, and length she had to go to to beg them to reconsider.  Also notes that the clinician with whom she had experienced the diagnosis of Borderline also told her later in treatment that if she presented at that time, she would not be diagnosed with.  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  Collaboratively addressed the dilemma posed by wanting to relieve it reminders of stigma in the past and reduce, fairly realistically, her risk of being stigmatized in the future by healthcare providers who may see the term  "Borderline", and at the same time feeling a therapeutic duty not to enable her in sanitizing or avoiding a history of uncomfortable images about herself.  Once understood, offered to rephrase going forward to something more to the point clinically, like "History of questionable diagnoses reviewed as pejorative".  Educated on the difference between words and CPT codes and the relative impact they may have in the system of reimbursement and health care.  Notes she had a bad fight last night with husband [name], basically concerning money and rights.  She responsibly took it upon herself to pay off debt, spreading cough over several credit cards, but did not call [name] first to get his blessing on strategy, and so he received a credit card alert while at work, apparently jumped to [employer] conclusions, and set her 22 texts accusing her of going rogue and hamstring him for the month and deciding spending of his own.  While she astutely and promptly offered an apology for forgetting to include him, and this did accomplish the goal of de-escalating, a separate issue came up that evening concerning printer ink needed, and [name] abruptly blew a gasket again, accusing her of selfishness, lack of concern for finances, unilateral-ism, etc., and she fell back into the trap of debating and explaining and counter attacking him, which only created a full-on, gunnysacking argument that left her demoralized, demeaned, and exhausted.  Compounded by the fact that the couples counseling they had planned to go to soon is delayed 2 more weeks.  Reviewed PT's choices and responding, affirming those that worked.  Noted that she has made unacknowledged progress in cleaning in the  house and encouraged that, according to her faith, blessed is she when persecuted for righteousness sake and when doing her good works without obvious reward.  Offered "do overs" as a strategy to help further the stalemate that their arguments tend to become.   Acknowledged that time he often comes around to apologize for harsh behavior, and [name] often apologizes for impulsive behavior, but they never seem to get around to what they can do differently under the circumstances.  If they can adopt a strategy of at least offering do overs, they can get positive practice at reacting better under stress, as well as give each other of the experience of what would be more friendly and constructive under stress, which would make better memories and lay down more constructive tracks for how they might respond in the future to similar things.  Suggested she take the lead and offer a "do over" for how she wishes she had handled the debt payment issue.  Clarifies she should trust that time he can take a lesson from example, and make sure she does not fish for him to return the favor, less that reinforced the whole impressions as she is selfish, manipulative, etc.  Agrees to try it.  Therapeutic modalities: Cognitive Behavioral Therapy, Solution-Oriented/Positive Psychology, Ego-Supportive and Faith-sensitive  Mental Status/Observations:  Appearance:   Casual     Behavior:  Appropriate  Motor:  Normal  Speech/Language:   Clear and Coherent  Affect:  Appropriate  Mood:  anxious and awkward/shamed at times  Thought process:  normal  Thought content:    WNL and worries  Sensory/Perceptual disturbances:    WNL  Orientation:  Fully oriented  Attention:  Good    Concentration:  Good  Memory:  WNL  Insight:    Good  Judgment:   Good  Impulse Control:  Fair   Risk Assessment: Danger to Self: No Self-injurious Behavior: No Danger to Others: No Physical Aggression / Violence: No Duty to Warn: No Access to Firearms a concern: No  Assessment of progress:  stabilized  Diagnosis:   ICD-10-CM   1. Social anxiety disorder  F40.10   2. Generalized anxiety disorder  F41.1   3. Relationship problem between partners  Z63.0   4. History of questionable psychiatric  diagnoses experienced as pejorative  Z86.59   5. History of anorexia, binge/purge type  Z86.59    Plan:  . Make a unilateral offer of a do-over . Agreed to rephrase diagnosis for clarity . Other recommendations/advice as may be noted above . Continue to utilize previously learned skills ad lib . Maintain medication as prescribed and work faithfully with relevant prescriber(s) if any changes are desired or seem indicated . Call the clinic on-call service, present to ER, or call 911 if any life-threatening psychiatric crisis Return in about 2 weeks (around 02/26/2020) for session(s) already scheduled. . Already scheduled visit in this office 02/26/2020.  Robley Fries, PhD Marliss Czar, [name] Clinical Psychologist, [facility] [address] [place], [place] [address] [phone]

## 2020-02-26 ENCOUNTER — Ambulatory Visit: Payer: 59 | Admitting: Psychiatry

## 2020-02-26 ENCOUNTER — Ambulatory Visit (INDEPENDENT_AMBULATORY_CARE_PROVIDER_SITE_OTHER): Payer: 59 | Admitting: Psychiatry

## 2020-02-26 DIAGNOSIS — Z630 Problems in relationship with spouse or partner: Secondary | ICD-10-CM

## 2020-02-26 DIAGNOSIS — F4010 Social phobia, unspecified: Secondary | ICD-10-CM

## 2020-02-26 DIAGNOSIS — F411 Generalized anxiety disorder: Secondary | ICD-10-CM

## 2020-02-26 DIAGNOSIS — F331 Major depressive disorder, recurrent, moderate: Secondary | ICD-10-CM

## 2020-02-26 DIAGNOSIS — Z8659 Personal history of other mental and behavioral disorders: Secondary | ICD-10-CM
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
## 2020-03-03 ENCOUNTER — Other Ambulatory Visit: Payer: Self-pay

## 2020-03-03 DIAGNOSIS — F331 Major depressive disorder, recurrent, moderate: Secondary | ICD-10-CM

## 2020-03-03 DIAGNOSIS — F330 Major depressive disorder, recurrent, mild: Secondary | ICD-10-CM

## 2020-03-03 MED ORDER — BUPROPION HCL ER (XL) 150 MG PO TB24: 150.0000 mg | ORAL_TABLET | Freq: Every day | ORAL | 0 refills | Status: DC

## 2020-03-03 MED ORDER — BUPROPION HCL ER (XL) 300 MG PO TB24: 300.0000 mg | ORAL_TABLET | Freq: Every morning | ORAL | 0 refills | Status: DC

## 2020-03-11 ENCOUNTER — Ambulatory Visit: Payer: 59 | Admitting: Psychiatry

## 2020-03-11 ENCOUNTER — Other Ambulatory Visit: Payer: Self-pay

## 2020-03-11 ENCOUNTER — Ambulatory Visit (INDEPENDENT_AMBULATORY_CARE_PROVIDER_SITE_OTHER): Payer: 59 | Admitting: Psychiatry

## 2020-03-11 DIAGNOSIS — Z630 Problems in relationship with spouse or partner: Secondary | ICD-10-CM | POA: Diagnosis not present

## 2020-03-11 DIAGNOSIS — F4010 Social phobia, unspecified: Secondary | ICD-10-CM

## 2020-03-11 DIAGNOSIS — F411 Generalized anxiety disorder: Secondary | ICD-10-CM

## 2020-03-11 DIAGNOSIS — Z8659 Personal history of other mental and behavioral disorders: Secondary | ICD-10-CM

## 2020-03-11 DIAGNOSIS — F331 Major depressive disorder, recurrent, moderate: Secondary | ICD-10-CM | POA: Diagnosis not present

## 2020-03-11 NOTE — Progress Notes (Signed)
Psychotherapy Progress Note [facility] Marliss Czar, [name]  Patient ID: Kathleen Soto     MRN: [id number] Therapy format: Individual psychotherapy Date: 03/11/2020      Start: 8:15a     Stop: 9:03a     Time Spent: 48 min Location: In-person   Session narrative (presenting needs, interim history, self-report of stressors and symptoms, applications of prior therapy, status changes, and interventions made in session) Doing OK with husband of late, less anxious than last time, but no further couples sessions since the one spoken of 2 weeks ago, just off the session.  Concerted effort on her part before that to notice and prevent her own "victim" thinking and talk, felt utterly punished last time by [name] litany of complaints.  Aware she fell into the trap of trying to interrupt to set the record straight, felt shamed by the therapist asking her to stop so she could hear [name].  Does see therapist catching onto resentment.  Dealing with intermittent Gi distress, seems to be tied to husband's behavior.  Encouraged to trust that therapist is catching on, to use "When __ happens" language to confront [name] constructively.  Therapeutic modalities: Cognitive Behavioral Therapy and Solution-Oriented/Positive Psychology  Mental Status/Observations:  Appearance:   Casual     Behavior:  Appropriate  Motor:  Normal  Speech/Language:   Clear and Coherent  Affect:  Appropriate  Mood:  anxious  Thought process:  normal  Thought content:    WNL  Sensory/Perceptual disturbances:    WNL  Orientation:  Fully oriented  Attention:  Good    Concentration:  Fair  Memory:  WNL  Insight:    Good  Judgment:   Good  Impulse Control:  Good   Risk Assessment: Danger to Self: No Self-injurious Behavior: No Danger to Others: No Physical Aggression / Violence: No Duty to Warn: No Access to Firearms a concern: No  Assessment of progress:  progressing  Diagnosis:   ICD-10-CM   1.  Moderate episode of recurrent major depressive disorder (HCC)  F33.1   2. Relationship problem between partners  Z63.0   3. Generalized anxiety disorder  F41.1   4. Social anxiety disorder  F40.10   5. History of anorexia, binge/purge type  Z86.59    Plan:  Marland Kitchen Apply communication ideas to marital communication . Note and dispute urgent and/or victim thinking where able . Focal effort on preventing defensive and interrupting statements which only backfire . Other recommendations/advice as may be noted above . Continue to utilize previously learned skills ad lib . Maintain medication as prescribed and work faithfully with relevant prescriber(s) if any changes are desired or seem indicated . Call the clinic on-call service, present to ER, or call 911 if any life-threatening psychiatric crisis Return in about 2 weeks (around 03/25/2020). . Already scheduled visit in this office 03/18/2020.  Robley Fries, PhD Marliss Czar, [name] Clinical Psychologist, [facility] [address] [place], [place] [address] [phone]

## 2020-03-13 ENCOUNTER — Other Ambulatory Visit: Payer: Self-pay | Admitting: Psychiatry

## 2020-03-13 DIAGNOSIS — F330 Major depressive disorder, recurrent, mild: Secondary | ICD-10-CM

## 2020-03-18 ENCOUNTER — Other Ambulatory Visit: Payer: Self-pay

## 2020-03-18 ENCOUNTER — Ambulatory Visit (INDEPENDENT_AMBULATORY_CARE_PROVIDER_SITE_OTHER): Payer: 59 | Admitting: Psychiatry

## 2020-03-18 DIAGNOSIS — F4010 Social phobia, unspecified: Secondary | ICD-10-CM | POA: Diagnosis not present

## 2020-03-18 DIAGNOSIS — F330 Major depressive disorder, recurrent, mild: Secondary | ICD-10-CM

## 2020-03-18 DIAGNOSIS — Z630 Problems in relationship with spouse or partner: Secondary | ICD-10-CM

## 2020-03-18 DIAGNOSIS — Z8659 Personal history of other mental and behavioral disorders: Secondary | ICD-10-CM

## 2020-03-18 DIAGNOSIS — F411 Generalized anxiety disorder: Secondary | ICD-10-CM | POA: Diagnosis not present

## 2020-03-18 NOTE — Progress Notes (Signed)
Psychotherapy Progress Note [facility] Marliss Czar, [name]  Patient ID: Kathleen Soto     MRN: [id number] Therapy format: Individual psychotherapy Date: 03/18/2020      Start: 8:14a     Stop: 9:02a     Time Spent: 48 min Location: In-person   Session narrative (presenting needs, interim history, self-report of stressors and symptoms, applications of prior therapy, status changes, and interventions made in session) Couples therapist terminated last week, reasoning that Kathleen Soto should do individual work.  Disappointed, and dilemma re. fee, given that the church was pledged to help out, feels obligated to make good but obviously unable.  Apparently, the process of TX communicating her decision raised other doubts, too, as she redirected to do the work they came for (f/u about resentments) after indicating that in her judgment they should stop and switch to individual work.  Kathleen Soto got cut off trying to answer a perception-checking question that TX herself asked, too.  Poor clarification by TX from the sound of it, required followup conversation in email addressing costs, also with unsatisfactory answers.  Beneficial side effects, apparently, in Kathleen Soto validating her disappointment, taking same side in being disappointed, and in acknowledging his own vulnerability.  While probably not strategic on his part, agreed it was helpful and concurred that someone who had previously been impressive getting started was significantly awkward at managing change in expectations.  Discussed possibilities for further couples-focused work and the validity of two individual therapies working toward the same goal.  Explored boundaries and considerations if husband was referred to this practice, or if some limited time spent seeing them together, including likelihood of a perceived imbalance, potential conflicts of interest.  Toward her ongoing need to cope with negativity, including most notably from  Kathleen Soto, advised about taking unfair criticism -- instead of defending herself or compulsive "I'm sorry", recommended she be as ready as possible to acknowledge discomfort but try to respond inquisitively, e.g., "Wow, really?" or "I'll have to think about it, but what do you want me hear in that?"  Meanwhile, success unburdening of some clothes and unmatched socks recently, as well as extra blankets and laundry baskets, achieving decluttering and charity in the same stroke.  Felt very good to declutter.  Therapeutic modalities: Cognitive Behavioral Therapy and Solution-Oriented/Positive Psychology  Mental Status/Observations:  Appearance:   Casual     Behavior:  Appropriate  Motor:  Normal  Speech/Language:   Clear and Coherent  Affect:  Appropriate  Mood:  anxious  Thought process:  normal  Thought content:    WNL  Sensory/Perceptual disturbances:    WNL  Orientation:  Fully oriented  Attention:  Good    Concentration:  Good  Memory:  WNL  Insight:    Good  Judgment:   Good  Impulse Control:  Fair   Risk Assessment: Danger to Self: No Self-injurious Behavior: No Danger to Others: No Physical Aggression / Violence: No Duty to Warn: No Access to Firearms a concern: No  Assessment of progress:  progressing  Diagnosis:   ICD-10-CM   1. Social anxiety disorder  F40.10   2. Relationship problem between partners  Z63.0   3. Mild episode of recurrent major depressive disorder (HCC)  F33.0   4. Generalized anxiety disorder  F41.1   5. History of questionable psychiatric diagnoses experienced as pejorative  Z86.59    Plan:  . Continue acts of goodwill and mutual understanding as able with husband and look for the better in partner's  behavior . Look for opportunity to inquire rather than defend or surrender under criticism . Other recommendations/advice as may be noted above . Continue to utilize previously learned skills ad lib . Maintain medication as prescribed and work  faithfully with relevant prescriber(s) if any changes are desired or seem indicated . Call the clinic on-call service, present to ER, or call 911 if any life-threatening psychiatric crisis Return in about 2 weeks (around 04/01/2020). . Already scheduled visit in this office 04/08/2020.  Robley Fries, PhD Marliss Czar, [name] Clinical Psychologist, [facility] [address], [address] [place], [place] [address] [phone]

## 2020-04-08 ENCOUNTER — Telehealth: Payer: 59 | Admitting: Psychiatry

## 2020-04-08 ENCOUNTER — Other Ambulatory Visit: Payer: Self-pay

## 2020-04-08 ENCOUNTER — Ambulatory Visit (INDEPENDENT_AMBULATORY_CARE_PROVIDER_SITE_OTHER): Payer: 59 | Admitting: Psychiatry

## 2020-04-08 DIAGNOSIS — Z8659 Personal history of other mental and behavioral disorders: Secondary | ICD-10-CM

## 2020-04-08 DIAGNOSIS — F4010 Social phobia, unspecified: Secondary | ICD-10-CM | POA: Diagnosis not present

## 2020-04-08 DIAGNOSIS — G4700 Insomnia, unspecified: Secondary | ICD-10-CM

## 2020-04-08 DIAGNOSIS — F411 Generalized anxiety disorder: Secondary | ICD-10-CM

## 2020-04-08 DIAGNOSIS — Z630 Problems in relationship with spouse or partner: Secondary | ICD-10-CM | POA: Diagnosis not present

## 2020-04-08 DIAGNOSIS — F330 Major depressive disorder, recurrent, mild: Secondary | ICD-10-CM

## 2020-04-08 MED ORDER — LAMOTRIGINE 150 MG PO TABS: 150.0000 mg | ORAL_TABLET | Freq: Every day | ORAL | 1 refills | Status: DC

## 2020-04-08 MED ORDER — CLONAZEPAM 0.5 MG PO TABS: ORAL_TABLET | ORAL | 5 refills | Status: DC

## 2020-04-08 NOTE — Progress Notes (Signed)
Kathleen Soto [id number] January 31, 1961 59 y.o.  Attempted to connect for video visit. Pt unable to sign into video visit on her phone and reports that insurance will not cover telephone visit. Will re-schedule for in office visit. Will send in refills of Klonopin and Lamictal.

## 2020-04-08 NOTE — Progress Notes (Signed)
Psychotherapy Progress Note [facility] Marliss Czar, [name]  Patient ID: MARINE LEZOTTE     MRN: [id number] Therapy format: Individual psychotherapy Date: 04/08/2020      Start: 8:17a     Stop: 9:06a     Time Spent: 49 min Location: In-person   Session narrative (presenting needs, interim history, self-report of stressors and symptoms, applications of prior therapy, status changes, and interventions made in session) Struggling, continues to obsess about her former diagnosis of BPD.  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  Realizes that Will's wedding has her on tenterhooks also about whether she is screwing up with prospective daughter-in-law [name].  Tends to relentlessly compare herself to inlaws as not socially acceptable, round-robin thoughts about her "diagnosis".  Complex stress of trying to assess and ready her own mother-in-law Fulton Mole -- elderly, in rehab for months -- to go to the wedding to satisfy demands of [name] and his family.  Internal warfare, sometimes, with intensely self-critical voice distorting things, e.g., [name] says how much she appreciates Alice and wants to make it possible for her to come and [name] hears that she is being thought of as insensitive, and before she knew it was explaining in text how it's not about being insensitive, pledging no need to help, my own responsibility, but thank you...  Realizes she still labors under her parents' indirect guilting and emotional boundary violations telling her how to fel and not feel, defending themselves (father, primarily) about not being responsible for her EDO, messages not to shame the family.  Brother once came up with the healthy, humorous idea of the "[place] Shame  Syndrome".  In anniversary season the past week or so with 59th birthday, anniversary of going to EDO center, father's birthday, mother's death day.  Had a healthy retreat time in mountains with dog and journaling.  Meditated on "Why does this time have to be hard every year?" and "Why can't I [cope better]?"  Has been afflicted with worry over silences and what seem to be incomplete responses to messages from [facility], trying hard right now to let her doubts ride without overdoing self-explanation.    Validated much of the above as exposure and response prevention for a range of intrusive doubts, what may even be considered a form of OCD.  Affirmed "wise-minded" moments per her DBT training from years ago.  For urges to overrespond, used image of trying to clean someone else's glasses for them but risking scratching them if you overdo it.  Above all, watch for the need to justify more than whether she is justified as the most potent issue and surrender it.  Assured her historical diagnosis has no bearing, only her tendencies, just like anyone else, and even if her husband does weaponize it in arguments.  Therapeutic modalities: Cognitive Behavioral Therapy and Solution-Oriented/Positive Psychology  Mental Status/Observations:  Appearance:   Casual     Behavior:  Appropriate  Motor:  Normal  Speech/Language:   Clear and Coherent  Affect:  Appropriate  Mood:  anxious and dysthymic  Thought process:  normal  Thought content:    Rumination  Sensory/Perceptual disturbances:    WNL  Orientation:  Fully oriented  Attention:  Good    Concentration:  Good  Memory:  WNL  Insight:    Good  Judgment:   Good  Impulse Control:  Fair   Risk Assessment: Danger to Self: No Self-injurious Behavior: No Danger to Others: No Physical Aggression / Violence: No Duty to Warn: No Access to Firearms a concern: No  Assessment of progress:  situational setback(s)  Diagnosis:   ICD-10-CM   1. Social anxiety  disorder  F40.10   2. History of questionable psychiatric diagnoses experienced as pejorative  Z86.59   3. Relationship problem between partners  Z63.0   4. Mild episode of recurrent major depressive disorder (HCC)  F33.0   5. History of anorexia, binge/purge type  Z86.59    Plan:  Marland Kitchen Actively dispute shame thoughts and trust that less reaction pays off with [name], and perceived scrutiny by others is either much less or nonexistent . Continue working with son and fiancee as agreed re wedding planning . Put self-respecting effort into housekeeping and let the chips fall otherwise . Other recommendations/advice as may be noted above . Continue to utilize previously learned skills ad lib . Maintain medication as prescribed and work faithfully with relevant prescriber(s) if any changes are desired or seem indicated . Call the clinic on-call service, present to ER, or call 911 if any life-threatening psychiatric crisis Return in about 2 weeks (around 04/22/2020). . Already scheduled visit in this office 04/08/2020.  Robley Fries, PhD Marliss Czar, [name] Clinical Psychologist, [facility] [address] [place], [place] [address] [phone]

## 2020-04-16 ENCOUNTER — Encounter: Payer: Self-pay | Admitting: Psychiatry

## 2020-04-16 ENCOUNTER — Telehealth (INDEPENDENT_AMBULATORY_CARE_PROVIDER_SITE_OTHER): Payer: 59 | Admitting: Psychiatry

## 2020-04-16 DIAGNOSIS — F330 Major depressive disorder, recurrent, mild: Secondary | ICD-10-CM | POA: Diagnosis not present

## 2020-04-16 DIAGNOSIS — F331 Major depressive disorder, recurrent, moderate: Secondary | ICD-10-CM | POA: Diagnosis not present

## 2020-04-16 MED ORDER — BUPROPION HCL ER (XL) 300 MG PO TB24: 300.0000 mg | ORAL_TABLET | Freq: Every morning | ORAL | 1 refills | Status: DC

## 2020-04-16 MED ORDER — BUPROPION HCL ER (XL) 150 MG PO TB24: 150.0000 mg | ORAL_TABLET | Freq: Every day | ORAL | 0 refills | Status: DC

## 2020-04-16 NOTE — Progress Notes (Addendum)
Kathleen Soto [id number] 1960-06-17 59 y.o.   Addended/Correction: Pt was seen via video.    Virtual Visit via Video Note  I connected with Kathleen Soto on 04/16/20 at 12:00 PM EST by a video enabled telemedicine application and verified that I am speaking with the correct person using two identifiers.  Location: Patient: [place] [facility] [place] Provider: [facility]   I discussed the limitations of evaluation and management by telemedicine and the availability of in person appointments. The patient expressed understanding and agreed to proceed. I discussed the assessment and treatment plan with the patient. The patient was provided an opportunity to ask questions and all were answered. The patient agreed with the plan and demonstrated an understanding of the instructions.   The patient was advised to call back or seek an in-person evaluation if the symptoms worsen or if the condition fails to improve as anticipated.  I provided 30 minutes of non-face-to-face time during this encounter.   Corie Chiquito, PMHNP    Subjective:   Patient ID:  Kathleen Soto is a 59 y.o. (DOB 07/10/1960) female.  Chief Complaint:  Chief Complaint  Patient presents with  . Anxiety  . Depression    HPI Kathleen Soto presents for follow-up of anxiety and depression. She reports that she has not been using Klonopin prn. She reports that she has noticed some recurrence of past s/s and patterns that she has difficulty changing. "I live in shame." She reports worsening mood and anxiety when she feels like her "self-worth is attacked." Reports feelings of worthlessness. She reports that she has some tightness in her chest with increased anxiety. Has nausea when anxiety is higher. She reports that she may have had 1-2 panic attacks.   She reports that depression is manageable. She reports that she has some irritability and that it is usually in response to identifiable trigger. Has had  some middle of the night awakenings. Appetite has been decreased and sometimes forgets to eat. Denies any intentional food restriction. Denies binge eating. Energy and motivation depend on her environment. She reports that she feels overwhelmed when she is at home and thinking about trying to get the house in order. Concentration has been difficult and is worse with increased anxiety. Denies SI.   Son is getting married and is stressed about "somehow screwing up."  Reports that she and her husband have been having some difficulties and started seeing a marital therapist through their church. Reports that husband has anger issues and can say hurtful things.   Denies worsening anxiety with increase in Wellbutrin  Past Psychiatric Medication Trials: Zoloft Paxil-sexual side effects, weight gain Luvox-ineffective Lexapro Prozac-sexual side effects Effexor Wellbutrin Latuda Abilify-effective Lamictal Strattera BuSpar Propranolol- Helped with tremor in the past. Had light-headedness at times with it.  Remeron- Effective for insomnia Klonopin  Review of Systems:  Review of Systems  Gastrointestinal: Positive for nausea.  Musculoskeletal: Negative for gait problem.  Skin:       Reports chronic urticaria has been improved.   Neurological: Negative for tremors.  Psychiatric/Behavioral:       Please refer to HPI    Medications: I have reviewed the patient's current medications.  Current Outpatient Medications  Medication Sig Dispense Refill  . buPROPion (WELLBUTRIN XL) 150 MG 24 hr tablet Take 1 tablet (150 mg total) by mouth daily. Take with a 300 mg tablet to equal total dose of 450 mg 90 tablet 0  . buPROPion (WELLBUTRIN XL) 300 MG 24 hr  tablet Take 1 tablet (300 mg total) by mouth every morning. 90 tablet 1  . desloratadine (CLARINEX) 5 MG tablet Take 5 mg by mouth daily.    Marland Kitchen. ezetimibe (ZETIA) 10 MG tablet Take 10 mg by mouth daily.    . Famotidine (PEPCID PO) Take by mouth 2  (two) times daily.    Marland Kitchen. lamoTRIgine (LAMICTAL) 150 MG tablet Take 1 tablet (150 mg total) by mouth daily. 90 tablet 1  . levocetirizine (XYZAL) 5 MG tablet Take 5 mg by mouth every evening.    . Levothyroxine Sodium 100 MCG CAPS Take 100 mcg by mouth daily.     . Omalizumab (XOLAIR Sunrise) Inject into the skin.    Marland Kitchen. ondansetron (ZOFRAN) 4 MG tablet Take by mouth at bedtime.    . pantoprazole (PROTONIX) 40 MG tablet Take 40 mg by mouth daily.    . Calcium Carbonate-Vitamin D (OSCAL 500/200 D-3 PO) Take by mouth.    . clonazePAM (KLONOPIN) 0.5 MG tablet Take 1/2-1 tab po qd prn anxiety 10 tablet 5  . levalbuterol (XOPENEX HFA) 45 MCG/ACT inhaler Inhale into the lungs every 4 (four) hours as needed for wheezing.    . levalbuterol (XOPENEX) 0.63 MG/3ML nebulizer solution Take 3 mLs (0.63 mg total) by nebulization every 6 (six) hours as needed for wheezing or shortness of breath. 30 mL 1  . methocarbamol (ROBAXIN) 500 MG tablet Take 500 mg by mouth every 6 (six) hours as needed for muscle spasms.    . montelukast (SINGULAIR) 10 MG tablet Take 10 mg by mouth at bedtime. (Patient not taking: Reported on 04/16/2020)     No current facility-administered medications for this visit.    Medication Side Effects: Other: Occ memory difficulties Tremors  Allergies:  Allergies  Allergen Reactions  . Prednisone     Esophageal spasms  . Sulfonamide Derivatives     REACTION: mouth ulcers    Past Medical History:  Diagnosis Date  . Depression   . GERD (gastroesophageal reflux disease)   . Hyperlipidemia   . Thyroid disease   . Urticaria, chronic     Family History  Problem Relation Age of Onset  . Thyroid disease Sister   . Cancer Father   . Thyroid disease Father   . Depression Father   . Cancer Brother   . Parkinson's disease Mother   . Alcohol abuse Paternal Grandfather   . Alcohol abuse Sister     Social History   Socioeconomic History  . Marital status: Married    Spouse name: Not on  file  . Number of children: Not on file  . Years of education: Not on file  . Highest education level: Not on file  Occupational History  . Not on file  Tobacco Use  . Smoking status: Never Smoker  . Smokeless tobacco: Never Used  Substance and Sexual Activity  . Alcohol use: Yes    Comment: rarely  . Drug use: No  . Sexual activity: Not on file  Other Topics Concern  . Not on file  Social History Narrative  . Not on file   Social Determinants of Health   Financial Resource Strain:   . Difficulty of Paying Living Expenses: Not on file  Food Insecurity:   . Worried About Programme researcher, broadcasting/film/videounning Out of Food in the Last Year: Not on file  . Ran Out of Food in the Last Year: Not on file  Transportation Needs:   . Lack of Transportation (Medical): Not on file  .  Lack of Transportation (Non-Medical): Not on file  Physical Activity:   . Days of Exercise per Week: Not on file  . Minutes of Exercise per Session: Not on file  Stress:   . Feeling of Stress : Not on file  Social Connections:   . Frequency of Communication with Friends and Family: Not on file  . Frequency of Social Gatherings with Friends and Family: Not on file  . Attends Religious Services: Not on file  . Active Member of Clubs or Organizations: Not on file  . Attends Banker Meetings: Not on file  . Marital Status: Not on file  Intimate Partner Violence:   . Fear of Current or Ex-Partner: Not on file  . Emotionally Abused: Not on file  . Physically Abused: Not on file  . Sexually Abused: Not on file    Past Medical History, Surgical history, Social history, and Family history were reviewed and updated as appropriate.   Please see review of systems for further details on the patient's review from today.   Objective:   Physical Exam:  There were no vitals taken for this visit.  Physical Exam Neurological:     Mental Status: She is alert and oriented to person, place, and time.     Cranial Nerves: No  dysarthria.  Psychiatric:        Attention and Perception: Attention and perception normal.        Mood and Affect: Mood is anxious and depressed.        Speech: Speech normal.        Behavior: Behavior is cooperative.        Thought Content: Thought content normal. Thought content is not paranoid or delusional. Thought content does not include homicidal or suicidal ideation. Thought content does not include homicidal or suicidal plan.        Cognition and Memory: Cognition and memory normal.        Judgment: Judgment normal.     Comments: Insight intact     Lab Review:  No results found for: NA, K, CL, CO2, GLUCOSE, BUN, CREATININE, CALCIUM, PROT, ALBUMIN, AST, ALT, ALKPHOS, BILITOT, GFRNONAA, GFRAA  No results found for: WBC, RBC, HGB, HCT, PLT, MCV, MCH, MCHC, RDW, LYMPHSABS, MONOABS, EOSABS, BASOSABS  No results found for: POCLITH, LITHIUM   No results found for: PHENYTOIN, PHENOBARB, VALPROATE, CBMZ   .res Assessment: Plan:   Pt seen for 30 minutes and time spent discussing recent s/s and possible triggers. Pt reports that recent s/s seem to be in response to stressors and would prefer to work on changing certain behaviors and patterns before changing medication. She reports that increase in Wellbutrin XL has been helpful for her depression and denies any worsening anxiety or irritability with higher dose.  Continue Wellbutrin XL 450 mg po qd for depression.  Continue klonopin 0.5 mg 1/2-1 tab po qd prn anxiety.  Continue Lamictal 150 mg po qd for mood s/s.  Pt to follow-up in 3 months or sooner if clinically indicated.  Recommend continuing therapy with Marliss Czar, PhD.  Patient advised to contact office with any questions, adverse effects, or acute worsening in signs and symptoms.  Kaydee was seen today for anxiety and depression.  Diagnoses and all orders for this visit:  Moderate episode of recurrent major depressive disorder (HCC) -     buPROPion (WELLBUTRIN XL) 150  MG 24 hr tablet; Take 1 tablet (150 mg total) by mouth daily. Take with a 300 mg tablet  to equal total dose of 450 mg  Mild episode of recurrent major depressive disorder (HCC) -     buPROPion (WELLBUTRIN XL) 300 MG 24 hr tablet; Take 1 tablet (300 mg total) by mouth every morning.    Please see After Visit Summary for patient specific instructions.  Future Appointments  Date Time Provider Department Center  04/29/2020  8:00 AM Robley Fries, PhD [facility] None  05/06/2020  9:00 AM Robley Fries, PhD [facility] None  05/20/2020  8:00 AM Robley Fries, PhD [facility] None  06/10/2020  8:00 AM Robley Fries, PhD [facility] None  06/24/2020  8:00 AM Robley Fries, PhD [facility] None    No orders of the defined types were placed in this encounter.     -------------------------------

## 2020-04-29 ENCOUNTER — Other Ambulatory Visit: Payer: Self-pay

## 2020-04-29 ENCOUNTER — Ambulatory Visit (INDEPENDENT_AMBULATORY_CARE_PROVIDER_SITE_OTHER): Payer: 59 | Admitting: Psychiatry

## 2020-04-29 DIAGNOSIS — F4010 Social phobia, unspecified: Secondary | ICD-10-CM | POA: Diagnosis not present

## 2020-04-29 DIAGNOSIS — Z8659 Personal history of other mental and behavioral disorders: Secondary | ICD-10-CM | POA: Diagnosis not present

## 2020-04-29 DIAGNOSIS — Z630 Problems in relationship with spouse or partner: Secondary | ICD-10-CM

## 2020-04-29 DIAGNOSIS — F331 Major depressive disorder, recurrent, moderate: Secondary | ICD-10-CM

## 2020-04-29 NOTE — Progress Notes (Signed)
Psychotherapy Progress Note [facility] Marliss Czar, [name]  Patient ID: Kathleen Soto     MRN: [id number] Therapy format: Individual psychotherapy Date: 04/29/2020      Start: 8:18a     Stop: 9:09a     Time Spent: 51 min Location: In-person   Session narrative (presenting needs, interim history, self-report of stressors and symptoms, applications of prior therapy, status changes, and interventions made in session) Stress with preparations for Will's wedding, including regrets received from some family for rehearsal dinner and nearly all out of town family unable to make the event and cherished family members not being able to make it. Will is getting distressed, worrying about whether she's stepping on meg's toes, and Will distorting [name]'s upset to [name] at times.  Had good clarification with her, thankfully, in which she was beautifully vulnerable and they seemed to genuinely understand each other.  In particular, opened up to [facility] about her own tendency to shame and agonizing what people will think, which meg understood and accepted warmly.  Able to take confirmation from [name] and positive, genuine feedback about [name]'s affection and respect for her.  Affirmed the gift of being frank and honest, willing to ask and receive feedback despite anxiety.  Re. BPD dx and self-esteem issues tied to labelling, has ben researching, trying to identify gifts of her own personality rather than get lost in perceived pejoratives.  Might be learning to value her abilities to see beauty, empathize with others, and find quirky humor.  For his part, [name] has found a new marriage counselor in [place] they will try.  Affirmed and encouraged.  Therapeutic modalities: Cognitive Behavioral Therapy and Solution-Oriented/Positive Psychology  Mental Status/Observations:  Appearance:   Casual     Behavior:  Appropriate  Motor:  Normal  Speech/Language:   Clear and Coherent  Affect:   Appropriate  Mood:  anxious, dysthymic and less  Thought process:  normal  Thought content:    WNL  Sensory/Perceptual disturbances:    WNL  Orientation:  Fully oriented  Attention:  Good    Concentration:  Good  Memory:  WNL  Insight:    Good  Judgment:   Good  Impulse Control:  Good   Risk Assessment: Danger to Self: No Self-injurious Behavior: No Danger to Others: No Physical Aggression / Violence: No Duty to Warn: No Access to Firearms a concern: No  Assessment of progress:  progressing  Diagnosis:   ICD-10-CM   1. Moderate episode of recurrent major depressive disorder (HCC)  F33.1   2. Social anxiety disorder  F40.10   3. Relationship problem between partners  Z63.0   4. History of questionable psychiatric diagnoses experienced as pejorative  Z86.59   5. History of anorexia, binge/purge type  Z86.59    Plan:  . Keep disputing and restating shame thoughts and practicing trust about social judgments of others, including prospective daughter-in-law . Stand ready to repeat vulnerable conversations like the one with [name] . Follow through in trust with new marriage counseling . Apply prior communication advice about interrupting hearing out, and constructive questions with [name] . Other recommendations/advice as may be noted above . Continue to utilize previously learned skills ad lib . Maintain medication as prescribed and work faithfully with relevant prescriber(s) if any changes are desired or seem indicated . Call the clinic on-call service, present to ER, or call 911 if any life-threatening psychiatric crisis Return in about 2 weeks (around 05/13/2020). . Already scheduled visit in this  office 05/06/2020.  Robley Fries, PhD Marliss Czar, [name] Clinical Psychologist, [facility] [address] [place], [place] [address] [phone]

## 2020-05-06 ENCOUNTER — Ambulatory Visit (INDEPENDENT_AMBULATORY_CARE_PROVIDER_SITE_OTHER): Payer: 59 | Admitting: Psychiatry

## 2020-05-06 ENCOUNTER — Other Ambulatory Visit: Payer: Self-pay

## 2020-05-06 DIAGNOSIS — F4010 Social phobia, unspecified: Secondary | ICD-10-CM | POA: Diagnosis not present

## 2020-05-06 DIAGNOSIS — Z630 Problems in relationship with spouse or partner: Secondary | ICD-10-CM

## 2020-05-06 DIAGNOSIS — Z8659 Personal history of other mental and behavioral disorders: Secondary | ICD-10-CM | POA: Diagnosis not present

## 2020-05-06 DIAGNOSIS — F331 Major depressive disorder, recurrent, moderate: Secondary | ICD-10-CM

## 2020-05-06 NOTE — Progress Notes (Signed)
Psychotherapy Progress Note [facility] Kathleen Czar, [name]  Patient ID: Kathleen Soto     MRN: [id number] Therapy format: Individual psychotherapy Date: 05/06/2020      Start: 9:15a     Stop: 10:03a     Time Spent: 48 min Location: In-person   Session narrative (presenting needs, interim history, self-report of stressors and symptoms, applications of prior therapy, status changes, and interventions made in session) Kathleen Soto' wedding went well except for how Kathleen Soto father unwisely got his mother out of Kathleen nursing home and still couldn't bring her to Kathleen wedding, creating distraction and strain for Kathleen Soto, who was irritable and unfair at times.  Withstood him yelling at traffic and giving unnecessary direction to her how to behave [place].  Found some poetic justice in Kathleen Soto being detained himself, while she sailed through, b/c he forgot he had metal screws he'd had in his car dumped in his duffel bag and required search.  Reports having dealt serenely through Kathleen day of Kathleen wedding, including calmly confronting Kathleen Soto about being an ass at times and weaponizing her insecurities during conflict.  Received a sincere apology Kathleen next day.  Overall, felt more self-possessed, amused rather than shaken.  Nice development on Kathleen Soto that cadets form Kathleen Soto staged a sword arch for Kathleen bridal party and that these young men stood in for family members who couldn't make it to Kathleen rehearsal dinner, bot as support and as some comic relief impersonating their name cards.  Discussed tactics going forward, as she and Kathleen Soto steer into Kathleen next try at marriage counseling.  Continued to recommend less-is-more strategy, questions over statements (good use made over Kathleen trip), and dedication to Kathleen principle that Kathleen Soto can only embarrass himself when he acts out -- it is no actual reflection on her.  Therapeutic modalities: Cognitive Behavioral Therapy and  Solution-Oriented/Positive Psychology  Mental Status/Observations:  Appearance:   Casual     Behavior:  Appropriate  Motor:  Normal  Speech/Language:   Clear and Coherent  Affect:  Appropriate  Mood:  less anxious, more content  Thought process:  normal  Thought content:    WNL  Sensory/Perceptual disturbances:    WNL  Orientation:  Fully oriented  Attention:  Good    Concentration:  Good  Memory:  WNL  Insight:    Good  Judgment:   Good  Impulse Control:  Good   Risk Assessment: Danger to Self: No Self-injurious Behavior: No Danger to Others: No Physical Aggression / Violence: No Duty to Warn: No Access to Firearms a concern: No  Assessment of progress:  progressing well  Diagnosis:   ICD-10-CM   1. Moderate episode of recurrent major depressive disorder (HCC)  F33.1   2. Social anxiety disorder  F40.10   3. Relationship problem between partners  Z63.0   4. History of questionable psychiatric diagnoses experienced as pejorative  Z86.59    Plan:  . Continue communication tactics under duress . Self-affirm good work done keeping cool, confronting factually, not interrupting . Self-affirm that when verbally abusive moments happen, they do not actually reflect on her . Other recommendations/advice as may be noted above . Continue to utilize previously learned skills ad lib . Maintain medication as prescribed and work faithfully with relevant prescriber(s) if any changes are desired or seem indicated . Call [place] clinic on-call service, present to ER, or call 911 if any life-threatening psychiatric crisis Return in about 2 weeks (  around 05/20/2020). . Already scheduled visit in this office 05/20/2020.  Kathleen Fries, PhD [place] Czar, [name] Clinical Psychologist, [facility] [address], [address] [place], [place] [address] [phone]

## 2020-05-20 ENCOUNTER — Other Ambulatory Visit: Payer: Self-pay

## 2020-05-20 ENCOUNTER — Ambulatory Visit (INDEPENDENT_AMBULATORY_CARE_PROVIDER_SITE_OTHER): Payer: 59 | Admitting: Psychiatry

## 2020-05-20 DIAGNOSIS — F4010 Social phobia, unspecified: Secondary | ICD-10-CM

## 2020-05-20 DIAGNOSIS — F331 Major depressive disorder, recurrent, moderate: Secondary | ICD-10-CM

## 2020-05-20 DIAGNOSIS — Z8659 Personal history of other mental and behavioral disorders: Secondary | ICD-10-CM

## 2020-05-20 DIAGNOSIS — Z630 Problems in relationship with spouse or partner: Secondary | ICD-10-CM

## 2020-05-20 NOTE — Progress Notes (Signed)
Psychotherapy Progress Note [facility] Marliss Czar, [name]  Patient ID: Kathleen Soto     MRN: [id number] Therapy format: Individual psychotherapy Date: 05/20/2020      Start: 8:13a     Stop: 8:59a     Time Spent: 46 min Location: In-person   Session narrative (presenting needs, interim history, self-report of stressors and symptoms, applications of prior therapy, status changes, and interventions made in session) [place] the [place], despite Will wanting it to be at home (undecorated, [place] ashamed) and [name] taking it personally how she agonizes on the phone about it (called it selfish, erroneously feels personally blamed for not being able to get into a newer, more suitable house).  Chanced to have a captive, 1:1 time with new daughter-in-law in a rental car.    More rounds of self-shaming, between arguments with [name], his continuing sense of victimhood, and anxiety about new couples therapy.  Still struggles with labelling as Borderline, which she understands now to be the "quiet" subtype, though it's clear it is [name] reactions that keep her sensitized to shame and anxiously motivated to edit herself, take blame prematurely, etc.  Encouraged to recognize again that [name] is difficult in his own right, it's not all about her personality, it's legitimate to state what she wants, and it's OK to acknowledge not seeing some things the same way.  Furthermore, diagnoses and labels are simply beside the point in constructive conflict and serve only to stake out advantage in what the partner sees as a Financial trader.  Affirmed and encouraged.  Therapeutic modalities: Cognitive Behavioral Therapy and Solution-Oriented/Positive Psychology  Mental Status/Observations:  Appearance:   Casual     Behavior:  Appropriate  Motor:  Normal  Speech/Language:   Clear and Coherent  Affect:  Appropriate  Mood:  anxious and dysthymic  Thought process:  normal   Thought content:    WNL  Sensory/Perceptual disturbances:    WNL  Orientation:  Fully oriented  Attention:  Good    Concentration:  Good  Memory:  WNL  Insight:    Good  Judgment:   Good  Impulse Control:  Good   Risk Assessment: Danger to Self: No Self-injurious Behavior: No Danger to Others: No Physical Aggression / Violence: No Duty to Warn: No Access to Firearms a concern: No  Assessment of progress:  situational setback(s)  Diagnosis:   ICD-10-CM   1. Moderate episode of recurrent major depressive disorder (HCC)  F33.1   2. Social anxiety disorder  F40.10   3. Relationship problem between partners  Z63.0   4. History of questionable psychiatric diagnoses experienced as pejorative  Z86.59   5. History of anorexia, binge/purge type  Z86.59    Plan:  . Redouble efforts to trust the process, dispute negative, shame-oriented thinking . Continue to practice noninterruption, questions to understand over making statements in he face of heated feedback . Use perception check ("Do you mean to ...") as able . Try to suspend researching the concept of Borderline Personality unless it will focus on skills, not flaws . Other recommendations/advice as may be noted above . Continue to utilize previously learned skills ad lib . Maintain medication as prescribed and work faithfully with relevant prescriber(s) if any changes are desired or seem indicated . Call the clinic on-call service, present to ER, or call 911 if any life-threatening psychiatric crisis Return for session(s) already scheduled. . Already scheduled visit in this office 06/10/2020.  Robley Fries,  PhD Luan Moore, [name] Clinical Psychologist, [place] [address] [place], [place] [address] [phone]

## 2020-05-29 ENCOUNTER — Other Ambulatory Visit: Payer: Self-pay

## 2020-05-29 ENCOUNTER — Ambulatory Visit (INDEPENDENT_AMBULATORY_CARE_PROVIDER_SITE_OTHER): Payer: 59 | Admitting: Psychiatry

## 2020-05-29 DIAGNOSIS — Z630 Problems in relationship with spouse or partner: Secondary | ICD-10-CM

## 2020-05-29 DIAGNOSIS — F331 Major depressive disorder, recurrent, moderate: Secondary | ICD-10-CM | POA: Diagnosis not present

## 2020-05-29 DIAGNOSIS — Z8659 Personal history of other mental and behavioral disorders: Secondary | ICD-10-CM | POA: Diagnosis not present

## 2020-05-29 DIAGNOSIS — F4010 Social phobia, unspecified: Secondary | ICD-10-CM | POA: Diagnosis not present
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
## 2020-06-10 ENCOUNTER — Other Ambulatory Visit: Payer: Self-pay

## 2020-06-10 ENCOUNTER — Ambulatory Visit (INDEPENDENT_AMBULATORY_CARE_PROVIDER_SITE_OTHER): Payer: 59 | Admitting: Psychiatry

## 2020-06-10 DIAGNOSIS — F331 Major depressive disorder, recurrent, moderate: Secondary | ICD-10-CM | POA: Diagnosis not present

## 2020-06-10 DIAGNOSIS — F4010 Social phobia, unspecified: Secondary | ICD-10-CM | POA: Diagnosis not present

## 2020-06-10 DIAGNOSIS — Z630 Problems in relationship with spouse or partner: Secondary | ICD-10-CM

## 2020-06-10 DIAGNOSIS — Z8659 Personal history of other mental and behavioral disorders: Secondary | ICD-10-CM | POA: Diagnosis not present

## 2020-06-10 NOTE — Progress Notes (Signed)
Psychotherapy Progress Note [facility] Marliss Czar, [name]  Patient ID: Kathleen Soto     MRN: [id number] Therapy format: Individual psychotherapy Date: 06/10/2020      Start: 8:15a     Stop: 9:00a     Time Spent: 45 min Location: In-person   Session narrative (presenting needs, interim history, self-report of stressors and symptoms, applications of prior therapy, status changes, and interventions made in session) Apprehensive about marital therapy today but getting a lot out of reading Rod Holler and trying to embrace mindful acceptance.  Encouraged in uplifting take on things termed "Borderline" and focus on skills over pathology.  Church course to read the Bible in a year.  Had an epiphany about differentiating sin and confession from being the problem, and found a wakeful joy one night -- as opposed to dread -- realizing better God's boundless love instead.  Compelling, intelligent pastor helpful as always.  Has chosen to take on diagnostic criteria of BPD and commit them each to prayers for help, comfort, patience, vision, appreciating over worrying, love, self-acceptance, courage, etc. rather than as "confessions" per se.  Affirmed and encouraged.   Re. marriage, confesses she has said some very shaming things herself (which, on hearing, sound to be more inconvenient truths, shot out in the heat of the conflict, which are enormously sensitive for [name].  Confirmed that she is in a stronger place now to let it happen if [name] unleashes a litany of complaints and harsh characterizations today at couples therapy, and still strategic to let him if he does, since it will be practice at [facility] and letting [name] display his own issues if it does happen, and blessing to both of them if it doesn't.  Reinforced willingness to drop her "turn" in the heat of thing with [name] -- interruptions, bad characterizations, double messages, the impulse to explain, etc., and at most say  something to the effect of, "Ouch but I'm listening."  Therapeutic modalities: Cognitive Behavioral Therapy and Solution-Oriented/Positive Psychology  Mental Status/Observations:  Appearance:   Casual     Behavior:  Appropriate  Motor:  Normal  Speech/Language:   Clear and Coherent  Affect:  Appropriate  Mood:  anxious  Thought process:  normal  Thought content:    WNL  Sensory/Perceptual disturbances:    WNL  Orientation:  Fully oriented  Attention:  Good    Concentration:  Good  Memory:  WNL  Insight:    Good  Judgment:   Good  Impulse Control:  Fair   Risk Assessment: Danger to Self: No Self-injurious Behavior: No Danger to Others: No Physical Aggression / Violence: No Duty to Warn: No Access to Firearms a concern: No  Assessment of progress:  progressing  Diagnosis:   ICD-10-CM   1. Social anxiety disorder  F40.10   2. Moderate episode of recurrent major depressive disorder (HCC)  F33.1   3. Relationship problem between partners  Z63.0   4. History of questionable psychiatric diagnoses experienced as pejorative  Z86.59    Plan:  . Continue practicing discretion, patience, and surrender of agenda under duress with [name].  Allow tears when needed, use "Ouch, but I'm listening" frame  . Continue affirming things "Borderline" as descriptions, not identity, and use of prayer for help . Other recommendations/advice as may be noted above . Continue to utilize previously learned skills ad lib . Maintain medication as prescribed and work faithfully with relevant prescriber(s) if any changes are desired or seem indicated .  Call the clinic on-call service, present to ER, or call 911 if any life-threatening psychiatric crisis Return in about 2 weeks (around 06/24/2020). . Already scheduled visit in this office 06/24/2020.  Robley Fries, PhD Marliss Czar, [name] Clinical Psychologist, [facility] [address] [place], [place] [address] [phone]

## 2020-06-24 ENCOUNTER — Ambulatory Visit (INDEPENDENT_AMBULATORY_CARE_PROVIDER_SITE_OTHER): Payer: 59 | Admitting: Psychiatry

## 2020-06-24 ENCOUNTER — Other Ambulatory Visit: Payer: Self-pay

## 2020-06-24 DIAGNOSIS — F330 Major depressive disorder, recurrent, mild: Secondary | ICD-10-CM | POA: Diagnosis not present

## 2020-06-24 DIAGNOSIS — F4010 Social phobia, unspecified: Secondary | ICD-10-CM

## 2020-06-24 DIAGNOSIS — Z630 Problems in relationship with spouse or partner: Secondary | ICD-10-CM
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
## 2020-07-01 ENCOUNTER — Other Ambulatory Visit: Payer: Self-pay

## 2020-07-01 DIAGNOSIS — F330 Major depressive disorder, recurrent, mild: Secondary | ICD-10-CM

## 2020-07-01 MED ORDER — LAMOTRIGINE 150 MG PO TABS: 150.0000 mg | ORAL_TABLET | Freq: Every day | ORAL | 1 refills | Status: DC

## 2020-07-08 ENCOUNTER — Other Ambulatory Visit: Payer: Self-pay

## 2020-07-08 ENCOUNTER — Ambulatory Visit (INDEPENDENT_AMBULATORY_CARE_PROVIDER_SITE_OTHER): Payer: 59 | Admitting: Psychiatry

## 2020-07-08 DIAGNOSIS — F4010 Social phobia, unspecified: Secondary | ICD-10-CM

## 2020-07-08 DIAGNOSIS — F330 Major depressive disorder, recurrent, mild: Secondary | ICD-10-CM

## 2020-07-08 DIAGNOSIS — Z8659 Personal history of other mental and behavioral disorders: Secondary | ICD-10-CM

## 2020-07-08 DIAGNOSIS — Z630 Problems in relationship with spouse or partner: Secondary | ICD-10-CM

## 2020-07-08 NOTE — Progress Notes (Signed)
Psychotherapy Progress Note [facility] Kathleen Czar, [name]  Patient ID: Kathleen Soto     MRN: [id number] Therapy format: Individual psychotherapy Date: 07/08/2020      Start: 8:12a     Stop: 9:00a     Time Spent: 48 min Location: In-person   Session narrative (presenting needs, interim history, self-report of stressors and symptoms, applications of prior therapy, status changes, and interventions made in session) Couples therapy basically weekly, seems to be taking so far.  Will be going on anniversary visit to [place], [place], a gift offered by Kathleen Soto.  Constructive interchange with therapist about whether skills in practice are too "fluffy" to withstand resentments (i.e., Kathleen Soto's).  Incremental progress in Kathleen Soto speaking for his feelings instead of just reacting, him using time-out strategy, and recovering from unwound moments.  Encouraged to appreciate them while she continues to stand anxious that it will turn back.  Kathleen Soto herself trying to exercise discretion still, not over-defend herself, interrupt, or pester.  Working faith that Kathleen Soto the couple therapist is strategically getting to [name] issues.  Personally, has asked again if Kathleen Soto needs her to work -- a common theme of doubt and checking with him, especially since he has so often spoken harshly about having to support them, not free to change his work.  He has declined, partly on grounds that she would disintegrate if she doesn't have her supports, like therapy and church.  Agreed it is demeaning to have it put that way but also that he still doesn't realize how that sounds, and she can take it with a grain of salt.  Has had him walk that back some already.  One morning worst of it was before he had had his stimulant, and afterward he presented much more kind and self-possessed.  Encouraged try to spot him bad moments, still, focus on catching positives and finding ways to reward him for exerting self-control,  de-escalating, and doing thoughtful things.  Support approach the couples therapist is taking ([name] method) focusing on acts of kindness.  Therapeutic modalities: Cognitive Behavioral Therapy and Solution-Oriented/Positive Psychology  Mental Status/Observations:  Appearance:   Casual     Behavior:  Appropriate  Motor:  Normal  Speech/Language:   Clear and Coherent  Affect:  Appropriate  Mood:  anxious  Thought process:  normal  Thought content:    WNL  Sensory/Perceptual disturbances:    WNL  Orientation:  Fully oriented  Attention:  Good    Concentration:  Good  Memory:  WNL  Insight:    Good  Judgment:   Good  Impulse Control:  Good   Risk Assessment: Danger to Self: No Self-injurious Behavior: No Danger to Others: No Physical Aggression / Violence: No Duty to Warn: No Access to Firearms a concern: No  Assessment of progress:  progressing  Diagnosis:   ICD-10-CM   1. Social anxiety disorder  F40.10   2. Mild episode of recurrent major depressive disorder (HCC)  F33.0   3. Relationship problem between partners  Z63.0   4. History of questionable psychiatric diagnoses experienced as pejorative  Z86.59    Plan:   Continue to practice self-compassion and tolerance for uncertainty  Continue to risk honest feedback to [place] framing as behavior, not character  Continue demonstrating grace, and catching it happening in Kathleen Soto's behavior, as most effective ways to raise it in him  Continue using questions under duress rather than protests or making points  Continue marriage counseling recommendations faithfully, protecting against any  new temptation to withhold own efforts at times when [facility] may balk or become cutting again . Other recommendations/advice as may be noted above . Continue to utilize previously learned skills ad lib . Maintain medication as prescribed and work faithfully with relevant prescriber(s) if any changes are desired or seem indicated . Call the  clinic on-call service, present to ER, or call 911 if any life-threatening psychiatric crisis Return for time at discretion. . Already scheduled visit in this office 07/22/2020.  Kathleen Fries, PhD Kathleen Czar, [name] Clinical Psychologist, [facility] [address] [place], [place] [address] [phone]

## 2020-07-22 ENCOUNTER — Ambulatory Visit (INDEPENDENT_AMBULATORY_CARE_PROVIDER_SITE_OTHER): Payer: 59 | Admitting: Psychiatry

## 2020-07-22 ENCOUNTER — Other Ambulatory Visit: Payer: Self-pay

## 2020-07-22 DIAGNOSIS — Z630 Problems in relationship with spouse or partner: Secondary | ICD-10-CM

## 2020-07-22 DIAGNOSIS — Z8659 Personal history of other mental and behavioral disorders: Secondary | ICD-10-CM | POA: Diagnosis not present

## 2020-07-22 DIAGNOSIS — F330 Major depressive disorder, recurrent, mild: Secondary | ICD-10-CM | POA: Diagnosis not present

## 2020-07-22 DIAGNOSIS — F4010 Social phobia, unspecified: Secondary | ICD-10-CM | POA: Diagnosis not present

## 2020-07-22 NOTE — Progress Notes (Signed)
Psychotherapy Progress Note [facility] Marliss Czar, [name]  Patient ID: Kathleen Soto     MRN: [id number] Therapy format: Individual psychotherapy Date: 07/22/2020      Start: 8:14a     Stop: 9:02a     Time Spent: 48 min Location: In-person   Session narrative (presenting needs, interim history, self-report of stressors and symptoms, applications of prior therapy, status changes, and interventions made in session) Generally still good, some conflict with [name] but better.  Enthused about a couple of finds in [employer], pleasant trip to [place] for anniversary and to see a best friend from high school.  Small successes compromising on decisions with [name], does see early improvement in conflict resolution.  Yesterday marred by [name] having a "man baby" reaction (her word) to a pain problem and schedule awareness error meaning they both nearly forgo couples therapy appointment and she had to scramble to keep it alone.  Before that, though, she had an inspiration to just snuggle with him in the evening, which seemed to work a bit of a miracle in mood the next day.  He does struggle with weight and compulsive eating, including -- awkwardly -- becoming more unattractive, and she  Discussed possible ways   Affirmed taking generous steps by giving patience, physical contact, and nonreactive listening, and affirmed what seems to be [name] quicker to recognize excessive negative expressions.  For further incidences of [name] blowing and apologizing -- which is a habit -- proposed solution-focused questions, e.g., "Thank you.  If we were on the verge of preventing that problem instead, what would it look like?  Is there anything you or I would be doing differently under stress that might enable that?"  Continues in spiritual direction as well.  Therapeutic modalities: Solution-Oriented/Positive Psychology, Humanistic/Existential and Faith-sensitive  Mental  Status/Observations:  Appearance:   Casual     Behavior:  Appropriate  Motor:  Normal  Speech/Language:   Clear and Coherent  Affect:  Appropriate  Mood:  less anxious  Thought process:  normal  Thought content:    WNL  Sensory/Perceptual disturbances:    WNL  Orientation:  Fully oriented  Attention:  Good    Concentration:  Good  Memory:  WNL  Insight:    Good  Judgment:   Good  Impulse Control:  Good   Risk Assessment: Danger to Self: No Self-injurious Behavior: No Danger to Others: No Physical Aggression / Violence: No Duty to Warn: No Access to Firearms a concern: No  Assessment of progress:  progressing  Diagnosis:   ICD-10-CM   1. Mild episode of recurrent major depressive disorder (HCC)  F33.0   2. Relationship problem between partners  Z63.0   3. Social anxiety disorder  F40.10   4. History of questionable psychiatric diagnoses experienced as pejorative  Z86.59   5. History of anorexia, binge/purge type  Z86.59    Plan:   . Continue [name] skills as directed by couples therapist . Option solution-focused questions for H to reframe/redirect hostile expressions  . Other recommendations/advice as may be noted above . Continue to utilize previously learned skills ad lib . Maintain medication as prescribed and work faithfully with relevant prescriber(s) if any changes are desired or seem indicated . Call the clinic on-call service, present to ER, or call 911 if any life-threatening psychiatric crisis Return for session(s) already scheduled. . Already scheduled visit in this office 08/04/2020.  Robley Fries, PhD Marliss Czar, [name] Clinical Psychologist, [facility]  Medical Group [facility] [address] [place],  [address] [phone]

## 2020-08-04 ENCOUNTER — Ambulatory Visit: Payer: 59 | Admitting: Psychiatry

## 2020-08-05 ENCOUNTER — Ambulatory Visit (INDEPENDENT_AMBULATORY_CARE_PROVIDER_SITE_OTHER): Payer: 59 | Admitting: Psychiatry

## 2020-08-05 ENCOUNTER — Other Ambulatory Visit: Payer: Self-pay

## 2020-08-05 DIAGNOSIS — F4010 Social phobia, unspecified: Secondary | ICD-10-CM

## 2020-08-05 DIAGNOSIS — Z8659 Personal history of other mental and behavioral disorders: Secondary | ICD-10-CM | POA: Diagnosis not present

## 2020-08-05 DIAGNOSIS — F330 Major depressive disorder, recurrent, mild: Secondary | ICD-10-CM | POA: Diagnosis not present

## 2020-08-05 DIAGNOSIS — Z630 Problems in relationship with spouse or partner: Secondary | ICD-10-CM
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
## 2020-08-19 ENCOUNTER — Ambulatory Visit (INDEPENDENT_AMBULATORY_CARE_PROVIDER_SITE_OTHER): Payer: 59 | Admitting: Psychiatry

## 2020-08-19 DIAGNOSIS — Z630 Problems in relationship with spouse or partner: Secondary | ICD-10-CM | POA: Diagnosis not present

## 2020-08-19 DIAGNOSIS — F4010 Social phobia, unspecified: Secondary | ICD-10-CM

## 2020-08-19 DIAGNOSIS — F330 Major depressive disorder, recurrent, mild: Secondary | ICD-10-CM
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
## 2020-09-02 ENCOUNTER — Ambulatory Visit: Payer: 59 | Admitting: Psychiatry

## 2020-09-03 ENCOUNTER — Ambulatory Visit: Payer: 59 | Admitting: Psychiatry

## 2020-09-16 ENCOUNTER — Other Ambulatory Visit: Payer: Self-pay

## 2020-09-16 ENCOUNTER — Ambulatory Visit (INDEPENDENT_AMBULATORY_CARE_PROVIDER_SITE_OTHER): Payer: BC Managed Care – PPO | Admitting: Psychiatry

## 2020-09-16 ENCOUNTER — Telehealth: Payer: Self-pay | Admitting: Psychiatry

## 2020-09-16 DIAGNOSIS — F330 Major depressive disorder, recurrent, mild: Secondary | ICD-10-CM | POA: Diagnosis not present

## 2020-09-16 DIAGNOSIS — Z8659 Personal history of other mental and behavioral disorders: Secondary | ICD-10-CM

## 2020-09-16 DIAGNOSIS — Z630 Problems in relationship with spouse or partner: Secondary | ICD-10-CM | POA: Diagnosis not present

## 2020-09-16 DIAGNOSIS — F4010 Social phobia, unspecified: Secondary | ICD-10-CM | POA: Diagnosis not present

## 2020-09-16 NOTE — Progress Notes (Signed)
Psychotherapy Progress Note [facility] Marliss Czar, [name]  Patient ID: Kathleen Soto     MRN: [id number] Therapy format: Individual psychotherapy Date: 09/16/2020      Start: 8:12a     Stop: 8:52a     Time Spent: 50 min Location: In-person   Session narrative (presenting needs, interim history, self-report of stressors and symptoms, applications of prior therapy, status changes, and interventions made in session) Under duress with insurance issue, since Kathleen Soto lost his job and she is under high-deductible plan now, believes the visit costs $150 OOP.  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  Couples therapy continues, with emphasis to try to be more businesslike with conflict, collaborate on not indulging resentment and defense.  Personal aim to reduce declutter, address the "nest" before the "pest", and, in the interest of relocating and again building a custom house in [place] next year and lodge with relatives.  Making progress in marriage, though Kathleen Soto can still have kneejerk response to rehash resentments.  One particular fight where Kathleen Soto became self-deprecating about the arc of his career, seemed to be baiting her into a conflict to externalize shame, possibly position her as the one shaming.  Per couples therapy, still trying to practice attunement and acts of kindness.  Encouraged Kathleen Soto to take opportunity to see his good friend in [place].    Sustained by meeting with her spiritual director, Kathleen Soto, enjoyed Kathleen Soto, and is thriving volunteering in Kathleen Soto.  Working on her ongoing struggle to see herself worthy in [employer].  Challenges from feeling too different, marginalized sometimes, in a group of laypeople on retreat.  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  Helping to let go others' abrasive actions and qualities.  Aware  that she trips up trying to explain herself to others, trying to practice trust that she does not have to.  Encouraged to notice "shoulds" trying to make her act out of guilt, try to reframe as "coulds" or opportunities to do good, enact love, show grace, etc.  Therapeutic modalities: Cognitive Behavioral Therapy, Solution-Oriented/Positive Psychology, Environmental manager, and Faith-sensitive  Mental Status/Observations:  Appearance:   Casual     Behavior:  Appropriate  Motor:  Normal  Speech/Language:   Clear and Coherent  Affect:  Appropriate  Mood:  anxious  Thought process:  normal  Thought content:    WNL  Sensory/Perceptual disturbances:    WNL  Orientation:  Fully oriented  Attention:  Good    Concentration:  Good  Memory:  WNL  Insight:    Good  Judgment:   Good  Impulse Control:  Good   Risk Assessment: Danger to Self: No Self-injurious Behavior: No Danger to Others: No Physical Aggression / Violence: No Duty to Warn: No Access to Firearms a concern: No  Assessment of progress:  progressing  Diagnosis:   ICD-10-CM   1. Social anxiety disorder  F40.10     2. Relationship problem between partners  Z63.0     3. Mild episode of recurrent major depressive disorder (HCC)  F33.0     4. History of questionable psychiatric diagnoses experienced as pejorative  Z86.59      Plan:  Notice and reframe "should" thinking Practice pro-relationship and conflict management skills per marital counseling Other recommendations/advice as may be noted above Continue to utilize previously learned skills ad lib Maintain medication as prescribed and  work faithfully with relevant prescriber(s) if any changes are desired or seem indicated Call the clinic on-call service, present to ER, or call 911 if any life-threatening psychiatric crisis Return for session(s) already scheduled. Already scheduled visit in this office 09/30/2020.  Robley Fries, PhD Marliss Czar, [name] Clinical  Psychologist, [facility] [address], [address] [place], [place] [address] [phone]

## 2020-09-16 NOTE — Telephone Encounter (Signed)
Next visit is 09/28/20.Maggie needs a refill on her Wellbutrin 150 mg and Lamictal. Pharmacy is:  [id number] - [place], [place] - [address] AT [facility] OF [address] Phone:  [phone]  Fax:  [phone]

## 2020-09-16 NOTE — Telephone Encounter (Signed)
Addendum to attached message on 09/16/20. [name] requests that the below pharmacy be deleted from her chart. It is:  [id number] - [place], [place] - [address] AT [address] Phone:  [phone]  Fax:  [phone]     And the following pharmacy be added to her chart and this is the one she is using now. [facility], [address] [place], [place], [place]  [address]. Phone number is [phone].

## 2020-09-21 ENCOUNTER — Other Ambulatory Visit: Payer: Self-pay

## 2020-09-21 ENCOUNTER — Telehealth: Payer: Self-pay | Admitting: Psychiatry

## 2020-09-21 DIAGNOSIS — F330 Major depressive disorder, recurrent, mild: Secondary | ICD-10-CM

## 2020-09-21 DIAGNOSIS — F331 Major depressive disorder, recurrent, moderate: Secondary | ICD-10-CM

## 2020-09-21 MED ORDER — BUPROPION HCL ER (XL) 300 MG PO TB24: 300.0000 mg | ORAL_TABLET | Freq: Every morning | ORAL | 0 refills | Status: DC

## 2020-09-21 MED ORDER — BUPROPION HCL ER (XL) 150 MG PO TB24: 150.0000 mg | ORAL_TABLET | Freq: Every day | ORAL | 0 refills | Status: DC

## 2020-09-21 NOTE — Telephone Encounter (Signed)
Rx sent 

## 2020-09-21 NOTE — Telephone Encounter (Signed)
Pt called again checking on the wellbutrin 150 refill. Please send to the [facility] on [address] in [place]. This is her new pharmacy. Please delete the one in [place]

## 2020-09-28 ENCOUNTER — Ambulatory Visit: Payer: 59 | Admitting: Psychiatry

## 2020-09-30 ENCOUNTER — Other Ambulatory Visit: Payer: Self-pay

## 2020-09-30 ENCOUNTER — Ambulatory Visit (INDEPENDENT_AMBULATORY_CARE_PROVIDER_SITE_OTHER): Payer: 59 | Admitting: Psychiatry

## 2020-09-30 DIAGNOSIS — F330 Major depressive disorder, recurrent, mild: Secondary | ICD-10-CM | POA: Diagnosis not present

## 2020-09-30 DIAGNOSIS — Z630 Problems in relationship with spouse or partner: Secondary | ICD-10-CM

## 2020-09-30 DIAGNOSIS — Z87898 Personal history of other specified conditions: Secondary | ICD-10-CM

## 2020-09-30 DIAGNOSIS — Z8659 Personal history of other mental and behavioral disorders: Secondary | ICD-10-CM

## 2020-09-30 DIAGNOSIS — F4010 Social phobia, unspecified: Secondary | ICD-10-CM | POA: Diagnosis not present

## 2020-09-30 DIAGNOSIS — F411 Generalized anxiety disorder: Secondary | ICD-10-CM
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
## 2020-10-14 ENCOUNTER — Ambulatory Visit (INDEPENDENT_AMBULATORY_CARE_PROVIDER_SITE_OTHER): Payer: BC Managed Care – PPO | Admitting: Psychiatry

## 2020-10-14 ENCOUNTER — Other Ambulatory Visit: Payer: Self-pay

## 2020-10-14 DIAGNOSIS — Z630 Problems in relationship with spouse or partner: Secondary | ICD-10-CM

## 2020-10-14 DIAGNOSIS — F4010 Social phobia, unspecified: Secondary | ICD-10-CM

## 2020-10-14 DIAGNOSIS — F330 Major depressive disorder, recurrent, mild: Secondary | ICD-10-CM

## 2020-10-14 DIAGNOSIS — Z8659 Personal history of other mental and behavioral disorders: Secondary | ICD-10-CM

## 2020-10-14 NOTE — Progress Notes (Signed)
Psychotherapy Progress Note [facility] Marliss Czar, [name]  Patient ID: Kathleen Soto     MRN: [id number] Therapy format: Individual psychotherapy Date: 10/14/2020      Start: 8:15a     Stop: 9:02a     Time Spent: 47 min Location: In-person   Session narrative (presenting needs, interim history, self-report of stressors and symptoms, applications of prior therapy, status changes, and interventions made in session) Marriage going significantly better, more control over conflict and more reliable kindness between her and 89.  Couples work yesterday got more into substantive issues, beginning to deal with [name] jealousy of her attachments to friends, as well as a couple of resentments he's built up about one of them.  Deems it productive but still fraught with tension.    Proud of her own handling of a computer mistake, losing a document she spent hours on, citing the environment (on retreat, not at home) as helpful.    Re. [name] and friends, some struggle being the middle when friends are concerned about her seeming dominated by him and her natural inclination to sympathize if they state feeling hurt or affected.  Notes an occasion recently where she missed articulating to friends why to be more considerate about [name] --  concerned with how badly her emotions get dominated by [name] behavior, and [name] took offense at her sharing their business.  Wishes she had messaged how she both appreciates their concern and needs to keep her own discretion about spending time with [name] and working on how they relate.  Even talking about it in session led to the kind of anxiety-shadowed, tongue-tied reaction she has been ashamed of in times past.  Support/empathy provided and reframed as practice taking on intense evaluation anxiety and further opportunities to come balancing complaints to sympathetic friends and her husband's dignity.  Therapeutic modalities: Cognitive Behavioral  Therapy and Solution-Oriented/Positive Psychology  Mental Status/Observations:  Appearance:   Casual     Behavior:  Appropriate  Motor:  Normal  Speech/Language:   Clear and Coherent  Affect:  Appropriate  Mood:  anxious  Thought process:  normal  Thought content:    WNL  Sensory/Perceptual disturbances:    WNL  Orientation:  Fully oriented  Attention:  Good    Concentration:  Good  Memory:  WNL  Insight:    Good  Judgment:   Good  Impulse Control:  Good   Risk Assessment: Danger to Self: No Self-injurious Behavior: No Danger to Others: No Physical Aggression / Violence: No Duty to Warn: No Access to Firearms a concern: No  Assessment of progress:  progressing  Diagnosis:   ICD-10-CM   1. Social anxiety disorder  F40.10     2. Relationship problem between partners  Z63.0     3. Mild episode of recurrent major depressive disorder (HCC)  F33.0     4. History of questionable psychiatric diagnoses experienced as pejorative  Z86.59      Plan:  Continue communication and conflict management tactics  Other recommendations/advice as may be noted above Continue to utilize previously learned skills ad lib Maintain medication as prescribed and work faithfully with relevant prescriber(s) if any changes are desired or seem indicated Call the clinic on-call service, present to ER, or call 911 if any life-threatening psychiatric crisis Return for session(s) already scheduled. Already scheduled visit in this office 10/28/2020.  Robley Fries, PhD Marliss Czar, [name] Clinical Psychologist, [facility] [address]  [address], [address] [place], [place] [address] [phone]

## 2020-10-28 ENCOUNTER — Other Ambulatory Visit: Payer: Self-pay

## 2020-10-28 ENCOUNTER — Ambulatory Visit: Payer: 59 | Admitting: Psychiatry

## 2020-10-28 DIAGNOSIS — F4010 Social phobia, unspecified: Secondary | ICD-10-CM | POA: Diagnosis not present

## 2020-10-28 DIAGNOSIS — Z8659 Personal history of other mental and behavioral disorders: Secondary | ICD-10-CM | POA: Diagnosis not present

## 2020-10-28 DIAGNOSIS — F330 Major depressive disorder, recurrent, mild: Secondary | ICD-10-CM

## 2020-10-28 DIAGNOSIS — Z630 Problems in relationship with spouse or partner: Secondary | ICD-10-CM | POA: Diagnosis not present

## 2020-10-28 NOTE — Progress Notes (Signed)
Psychotherapy Progress Note [facility] Marliss Czar, [name]  Patient ID: Kathleen Soto     MRN: [id number] Therapy format: Individual psychotherapy Date: 10/28/2020      Start: 8:08a     Stop: 8:58a     Time Spent: 50 min Location: In-person   Session narrative (presenting needs, interim history, self-report of stressors and symptoms, applications of prior therapy, status changes, and interventions made in session) Short sleep today, since [name] deviated from his usual rule and answered an involved call from his brother late last night, which involved overhearing BIL cursing her own brother as a liberal effectively not welcome at the parents' beach house and tangled attempts on [name] part to stand him down and then to get comfortable sleep, which largely means crowding her in the bed, getting irritated when she stirred, then getting up to eat (compulsively).  Generally sees him doing better in thoughtfulness, e.g., respecting and asking about her plans and interest with friends he dislikes, but last couples session reached a vulnerable place with him supportively confronting his habit of cutting cut off people who "violate" his trust as he defines it (too often her), and he stewed enough afterwards to turn it into an extended tirade of the sort they began therapy to deal with, triggering her old defensiveness, shame spiral, etc.  One such episode exacerbated her hives to the point where she lost touch with how she was scratching hard and wound up bleeding, further triggering self-attack for "doing borderline".  Yesterday he did take challenge to bring up his reaction to [place]'s feedback, to some credit, seemed to have a therapeutic, trust-building experience with [name].    Otherwise, been working on cleaning, sorting, decluttering in preparation for house move.  Affirmed and encouraged.  Encouraged in seeing positive signs where they exist, accepting that [name] will come at her  in very different states of mind sometimes, consistent with being an addict (food), and that it continues to be her opportunity -- much more than obligation -- to forgo trying to reason with him when he's in that most agitated, emotionally driven state and let him run down his own resentment without trying to defend herself or capitulate, trusting that he comes around faster when he does not get the "enemy" or the righteous fight he seems to need, sometimes, to justify indulging himself.  Therapeutic modalities: Cognitive Behavioral Therapy, Solution-Oriented/Positive Psychology, and Ego-Supportive  Mental Status/Observations:  Appearance:   Casual     Behavior:  Appropriate  Motor:  Normal  Speech/Language:   Clear and Coherent  Affect:  Appropriate  Mood:  anxious and brighter  Thought process:  normal  Thought content:    WNL  Sensory/Perceptual disturbances:    WNL  Orientation:  Fully oriented  Attention:  Good    Concentration:  Good  Memory:  WNL  Insight:    Good  Judgment:   Good  Impulse Control:  Good   Risk Assessment: Danger to Self: No Self-injurious Behavior: No Danger to Others: No Physical Aggression / Violence: No Duty to Warn: No Access to Firearms a concern: No  Assessment of progress:  progressing  Diagnosis:   ICD-10-CM   1. Social anxiety disorder  F40.10     2. Relationship problem between partners  Z63.0     3. Mild episode of recurrent major depressive disorder (HCC)  F33.0     4. History of questionable psychiatric diagnoses experienced as pejorative  Z86.59  Plan:  Continue use of communication tactics for conflict management with husband Prioritize reframing [name] outbursts as opportunities to pass up triggering by trying to reason/debate and to merely empathize or let him run down resentful feelings and come to himself Other recommendations/advice as may be noted above Continue to utilize previously learned skills ad lib Maintain  medication as prescribed and work faithfully with relevant prescriber(s) if any changes are desired or seem indicated Call the clinic on-call service, present to ER, or call 911 if any life-threatening psychiatric crisis Return for session(s) already scheduled. Already scheduled visit in this office 11/11/2020.  Robley Fries, PhD Marliss Czar, [name] Clinical Psychologist, [facility] [address], [address] [place], [place] [address] [phone]

## 2020-11-02 ENCOUNTER — Other Ambulatory Visit: Payer: Self-pay

## 2020-11-02 ENCOUNTER — Telehealth: Payer: Self-pay | Admitting: Psychiatry

## 2020-11-02 DIAGNOSIS — F331 Major depressive disorder, recurrent, moderate: Secondary | ICD-10-CM

## 2020-11-02 DIAGNOSIS — F330 Major depressive disorder, recurrent, mild: Secondary | ICD-10-CM

## 2020-11-02 MED ORDER — BUPROPION HCL ER (XL) 300 MG PO TB24: 300.0000 mg | ORAL_TABLET | Freq: Every morning | ORAL | 0 refills | Status: DC

## 2020-11-02 MED ORDER — BUPROPION HCL ER (XL) 150 MG PO TB24: 150.0000 mg | ORAL_TABLET | Freq: Every day | ORAL | 0 refills | Status: DC

## 2020-11-02 NOTE — Telephone Encounter (Signed)
Pt requesting Rx for Wellbutrin 150 mg will be out 6/22. Has apt 6/29. [facility] [phone] [place]. Please take [facility] out of her chart. No longer use. Contact # [phone]

## 2020-11-02 NOTE — Telephone Encounter (Signed)
Pharmacy updated and Rx sent.  ?

## 2020-11-11 ENCOUNTER — Ambulatory Visit (INDEPENDENT_AMBULATORY_CARE_PROVIDER_SITE_OTHER): Payer: BC Managed Care – PPO | Admitting: Psychiatry

## 2020-11-11 ENCOUNTER — Encounter: Payer: Self-pay | Admitting: Psychiatry

## 2020-11-11 ENCOUNTER — Telehealth (INDEPENDENT_AMBULATORY_CARE_PROVIDER_SITE_OTHER): Payer: BC Managed Care – PPO | Admitting: Psychiatry

## 2020-11-11 DIAGNOSIS — F330 Major depressive disorder, recurrent, mild: Secondary | ICD-10-CM | POA: Diagnosis not present

## 2020-11-11 DIAGNOSIS — Z8659 Personal history of other mental and behavioral disorders: Secondary | ICD-10-CM

## 2020-11-11 DIAGNOSIS — Z630 Problems in relationship with spouse or partner: Secondary | ICD-10-CM | POA: Diagnosis not present

## 2020-11-11 DIAGNOSIS — F4010 Social phobia, unspecified: Secondary | ICD-10-CM | POA: Diagnosis not present

## 2020-11-11 DIAGNOSIS — Z87898 Personal history of other specified conditions: Secondary | ICD-10-CM

## 2020-11-11 DIAGNOSIS — F411 Generalized anxiety disorder: Secondary | ICD-10-CM | POA: Diagnosis not present

## 2020-11-11 MED ORDER — BUPROPION HCL ER (XL) 300 MG PO TB24
300.0000 mg | ORAL_TABLET | Freq: Every morning | ORAL | 1 refills | Status: DC
Start: 2020-11-11 — End: 2021-05-26

## 2020-11-11 MED ORDER — LAMOTRIGINE 150 MG PO TABS
150.0000 mg | ORAL_TABLET | Freq: Every day | ORAL | 1 refills | Status: DC
Start: 2020-11-11 — End: 2021-07-21

## 2020-11-11 MED ORDER — BUPROPION HCL ER (XL) 150 MG PO TB24: 150.0000 mg | ORAL_TABLET | Freq: Every day | ORAL | 1 refills | Status: DC

## 2020-11-11 NOTE — Progress Notes (Signed)
Kathleen Soto [id number] 13-Jan-1961 60 y.o.  Virtual Visit via Video Note  I connected with pt @ on 11/11/20 at  9:45 AM EDT by a video enabled telemedicine application and verified that I am speaking with the correct person using two identifiers.   I discussed the limitations of evaluation and management by telemedicine and the availability of in person appointments. The patient expressed understanding and agreed to proceed.  I discussed the assessment and treatment plan with the patient. The patient was provided an opportunity to ask questions and all were answered. The patient agreed with the plan and demonstrated an understanding of the instructions.   The patient was advised to call back or seek an in-person evaluation if the symptoms worsen or if the condition fails to improve as anticipated.  I provided 30 minutes of non-face-to-face time during this encounter.  The patient was located at home.  The provider was located at [facility].   Corie Chiquito, PMHNP   Subjective:   Patient ID:  Kathleen Soto is a 60 y.o. (DOB 10/06/1960) female.  Chief Complaint:  Chief Complaint  Patient presents with   Follow-up    Anxiety and depression    HPI Kathleen Soto presents for follow-up of anxiety and depression. She reports, "things have been going really well." She reports that she and her husband have been seeing a marital therapist and this has been helpful. She reports that she has had some anxiety with moving, purging some belongings, and working on marital stressors. Denies panic attacks. She reports that she has had a could of moments of increased anxiety. She reports that she foes not feel depressed overall. She reports that she has had some "bouts" of depression in the context of severe marital stressors. She reports that addition of Wellbutrin 150 mg has been helpful for depression. She denies anhedonia. She reports that her sleep is "not good" and will  periodically awaken with rumination. Reports that she sleeps better when she sleep alone. Appetite has been "too good" and has been eating on the go. She reports some weight gain. Energy and motivation have been improved. Concentration has been ok. She reports that she loses objects frequently, such as her keys. Denies SI.   Son got married and is in [place]. Other son is Research scientist (medical) at [employer]. They are building a house and staying at another house while cleaning out their house. She reports that a friend has been helping her clean out the house. She has also been helping other family members.   She denies using Klonopin prn recently.   Past Psychiatric Medication Trials: Zoloft Paxil-sexual side effects, weight gain Luvox-ineffective Lexapro Prozac-sexual side effects Effexor Wellbutrin Latuda Abilify-effective Lamictal Strattera BuSpar Propranolol- Helped with tremor in the past. Had light-headedness at times with it. Remeron- Effective for insomnia Klonopin  Review of Systems:  Review of Systems  Gastrointestinal:  Positive for nausea.  Musculoskeletal:  Negative for gait problem.  Skin:        She reports that she had reduced medication for chronic urticaria and had worsening hives. She recently re-started some treatments for hives.   Neurological:  Negative for tremors.  Psychiatric/Behavioral:         Please refer to HPI   Medications: I have reviewed the patient's current medications.  Current Outpatient Medications  Medication Sig Dispense Refill   clonazePAM (KLONOPIN) 0.5 MG tablet Take 1/2-1 tab po qd prn anxiety 10 tablet 5   ezetimibe (ZETIA) 10  MG tablet Take 10 mg by mouth daily.     Famotidine (PEPCID PO) Take by mouth daily at 12 noon.     levalbuterol (XOPENEX HFA) 45 MCG/ACT inhaler Inhale into the lungs every 4 (four) hours as needed for wheezing.     levocetirizine (XYZAL) 5 MG tablet Take 5 mg by mouth every evening.     Levothyroxine Sodium  100 MCG CAPS Take 100 mcg by mouth daily.      methocarbamol (ROBAXIN) 500 MG tablet Take 500 mg by mouth every 6 (six) hours as needed for muscle spasms.     montelukast (SINGULAIR) 10 MG tablet Take 10 mg by mouth at bedtime.     Omalizumab (XOLAIR [place]) Inject into the skin.     ondansetron (ZOFRAN) 4 MG tablet Take by mouth at bedtime.     pantoprazole (PROTONIX) 40 MG tablet Take 40 mg by mouth daily.     buPROPion (WELLBUTRIN XL) 150 MG 24 hr tablet Take 1 tablet (150 mg total) by mouth daily. Take with a 300 mg tablet to equal total dose of 450 mg 90 tablet 1   buPROPion (WELLBUTRIN XL) 300 MG 24 hr tablet Take 1 tablet (300 mg total) by mouth every morning. Take with 150 mg tablet to equal total dose of 450 mg 90 tablet 1   desloratadine (CLARINEX) 5 MG tablet Take 5 mg by mouth daily. (Patient not taking: Reported on 11/11/2020)     lamoTRIgine (LAMICTAL) 150 MG tablet Take 1 tablet (150 mg total) by mouth daily. 90 tablet 1   No current facility-administered medications for this visit.    Medication Side Effects: Other: ? Possible memory issues  Allergies:  Allergies  Allergen Reactions   Prednisone     Esophageal spasms   Reglan [Metoclopramide]     Reports adverse reaction (cognitive side effects) to IV Reglan   Sulfonamide Derivatives     REACTION: mouth ulcers    Past Medical History:  Diagnosis Date   Depression    GERD (gastroesophageal reflux disease)    Hyperlipidemia    Thyroid disease    Urticaria, chronic     Family History  Problem Relation Age of Onset   Thyroid disease Sister    Cancer Father    Thyroid disease Father    Depression Father    Cancer Brother    Parkinson's disease Mother    Alcohol abuse Paternal Grandfather    Alcohol abuse Sister     Social History   Socioeconomic History   Marital status: Married    Spouse name: Not on file   Number of children: Not on file   Years of education: Not on file   Highest education level: Not on  file  Occupational History   Not on file  Tobacco Use   Smoking status: Never   Smokeless tobacco: Never  Substance and Sexual Activity   Alcohol use: Yes    Comment: rarely   Drug use: No   Sexual activity: Not on file  Other Topics Concern   Not on file  Social History Narrative   Not on file   Social Determinants of Health   Financial Resource Strain: Not on file  Food Insecurity: Not on file  Transportation Needs: Not on file  Physical Activity: Not on file  Stress: Not on file  Social Connections: Not on file  Intimate Partner Violence: Not on file    Past Medical History, Surgical history, Social history, and Family history were  reviewed and updated as appropriate.   Please see review of systems for further details on the patient's review from today.   Objective:   Physical Exam:  There were no vitals taken for this visit.  Physical Exam Neurological:     Mental Status: She is alert and oriented to person, place, and time.     Cranial Nerves: No dysarthria.  Psychiatric:        Attention and Perception: Attention and perception normal.        Mood and Affect: Mood normal.        Speech: Speech normal.        Behavior: Behavior is cooperative.        Thought Content: Thought content normal. Thought content is not paranoid or delusional. Thought content does not include homicidal or suicidal ideation. Thought content does not include homicidal or suicidal plan.        Cognition and Memory: Cognition and memory normal.        Judgment: Judgment normal.     Comments: Insight intact    Lab Review:  No results found for: NA, K, CL, CO2, GLUCOSE, BUN, CREATININE, CALCIUM, PROT, ALBUMIN, AST, ALT, ALKPHOS, BILITOT, GFRNONAA, GFRAA  No results found for: WBC, RBC, HGB, HCT, PLT, MCV, MCH, MCHC, RDW, LYMPHSABS, MONOABS, EOSABS, BASOSABS  No results found for: POCLITH, LITHIUM   No results found for: PHENYTOIN, PHENOBARB, VALPROATE, CBMZ   .res Assessment:  Plan:   Pt seen for 30 minutes and time spent discussing psychosocial changes and starting marital counseling. Agreed that mood and anxiety s/s have been well controlled overall despite some psychosocial stressors. She reports that increase in Wellbutrin XL to 450 mg po qd has been helpful for her depression withou causing any side effects. Will therefore continue Wellbutrin XL at the 450 mg dose.  Continue Lamictal 150 mg po qd for mood s/s.  Continue Klonopin prn anxiety/insomnia. She reports that she does not need a script at this time. Last filled 04/08/20 for #10 tabs. Recommend continuing individual and marital therapy. Pt to follow-up with this provider in 6 months or sooner if clinically indicated.  Patient advised to contact office with any questions, adverse effects, or acute worsening in signs and symptoms.  Sarann was seen today for follow-up.  Diagnoses and all orders for this visit:  Generalized anxiety disorder  Mild episode of recurrent major depressive disorder (HCC) -     buPROPion (WELLBUTRIN XL) 150 MG 24 hr tablet; Take 1 tablet (150 mg total) by mouth daily. Take with a 300 mg tablet to equal total dose of 450 mg -     buPROPion (WELLBUTRIN XL) 300 MG 24 hr tablet; Take 1 tablet (300 mg total) by mouth every morning. Take with 150 mg tablet to equal total dose of 450 mg -     lamoTRIgine (LAMICTAL) 150 MG tablet; Take 1 tablet (150 mg total) by mouth daily.  History of anorexia, binge/purge type    Please see After Visit Summary for patient specific instructions.  Future Appointments  Date Time Provider Department Center  11/25/2020  8:00 AM Robley Fries, PhD [facility] None  12/09/2020  9:00 AM Robley Fries, PhD [facility] None  12/24/2020  4:00 PM Robley Fries, PhD [facility] None  01/06/2021  8:00 AM Robley Fries, PhD [facility] None    No orders of the defined types were placed in this encounter.     -------------------------------
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
## 2020-11-25 ENCOUNTER — Ambulatory Visit (INDEPENDENT_AMBULATORY_CARE_PROVIDER_SITE_OTHER): Payer: BC Managed Care – PPO | Admitting: Psychiatry

## 2020-11-25 DIAGNOSIS — F4010 Social phobia, unspecified: Secondary | ICD-10-CM

## 2020-11-25 DIAGNOSIS — Z8659 Personal history of other mental and behavioral disorders: Secondary | ICD-10-CM

## 2020-11-25 DIAGNOSIS — F330 Major depressive disorder, recurrent, mild: Secondary | ICD-10-CM | POA: Diagnosis not present

## 2020-11-25 DIAGNOSIS — Z630 Problems in relationship with spouse or partner: Secondary | ICD-10-CM

## 2020-11-25 DIAGNOSIS — Z87898 Personal history of other specified conditions: Secondary | ICD-10-CM

## 2020-11-25 DIAGNOSIS — F411 Generalized anxiety disorder: Secondary | ICD-10-CM
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
## 2020-12-09 ENCOUNTER — Telehealth (INDEPENDENT_AMBULATORY_CARE_PROVIDER_SITE_OTHER): Payer: BC Managed Care – PPO | Admitting: Psychiatry

## 2020-12-09 DIAGNOSIS — Z87898 Personal history of other specified conditions: Secondary | ICD-10-CM

## 2020-12-09 DIAGNOSIS — Z8659 Personal history of other mental and behavioral disorders: Secondary | ICD-10-CM

## 2020-12-09 DIAGNOSIS — F4010 Social phobia, unspecified: Secondary | ICD-10-CM | POA: Diagnosis not present

## 2020-12-09 DIAGNOSIS — Z630 Problems in relationship with spouse or partner: Secondary | ICD-10-CM | POA: Diagnosis not present

## 2020-12-09 DIAGNOSIS — F411 Generalized anxiety disorder: Secondary | ICD-10-CM | POA: Diagnosis not present

## 2020-12-09 DIAGNOSIS — F330 Major depressive disorder, recurrent, mild: Secondary | ICD-10-CM | POA: Diagnosis not present
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
## 2020-12-24 ENCOUNTER — Ambulatory Visit: Payer: BC Managed Care – PPO | Admitting: Psychiatry

## 2021-01-06 ENCOUNTER — Ambulatory Visit: Payer: BC Managed Care – PPO | Admitting: Psychiatry

## 2021-01-06 ENCOUNTER — Other Ambulatory Visit: Payer: Self-pay

## 2021-01-06 DIAGNOSIS — Z87898 Personal history of other specified conditions: Secondary | ICD-10-CM

## 2021-01-06 DIAGNOSIS — Z630 Problems in relationship with spouse or partner: Secondary | ICD-10-CM | POA: Diagnosis not present

## 2021-01-06 DIAGNOSIS — F4010 Social phobia, unspecified: Secondary | ICD-10-CM | POA: Diagnosis not present

## 2021-01-06 DIAGNOSIS — F330 Major depressive disorder, recurrent, mild: Secondary | ICD-10-CM | POA: Diagnosis not present

## 2021-01-06 DIAGNOSIS — Z8659 Personal history of other mental and behavioral disorders: Secondary | ICD-10-CM

## 2021-01-06 DIAGNOSIS — F411 Generalized anxiety disorder: Secondary | ICD-10-CM
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
## 2021-01-13 ENCOUNTER — Other Ambulatory Visit: Payer: Self-pay

## 2021-01-13 ENCOUNTER — Ambulatory Visit: Payer: BC Managed Care – PPO | Admitting: Psychiatry

## 2021-01-13 DIAGNOSIS — Z630 Problems in relationship with spouse or partner: Secondary | ICD-10-CM

## 2021-01-13 DIAGNOSIS — Z8659 Personal history of other mental and behavioral disorders: Secondary | ICD-10-CM

## 2021-01-13 DIAGNOSIS — F4010 Social phobia, unspecified: Secondary | ICD-10-CM | POA: Diagnosis not present

## 2021-01-13 DIAGNOSIS — Z87898 Personal history of other specified conditions: Secondary | ICD-10-CM

## 2021-01-13 DIAGNOSIS — F411 Generalized anxiety disorder: Secondary | ICD-10-CM | POA: Diagnosis not present
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
## 2021-01-20 ENCOUNTER — Ambulatory Visit: Payer: BC Managed Care – PPO | Admitting: Psychiatry

## 2021-02-03 ENCOUNTER — Ambulatory Visit (INDEPENDENT_AMBULATORY_CARE_PROVIDER_SITE_OTHER): Payer: BC Managed Care – PPO | Admitting: Psychiatry

## 2021-02-03 ENCOUNTER — Other Ambulatory Visit: Payer: Self-pay

## 2021-02-03 DIAGNOSIS — F330 Major depressive disorder, recurrent, mild: Secondary | ICD-10-CM

## 2021-02-03 DIAGNOSIS — Z8659 Personal history of other mental and behavioral disorders: Secondary | ICD-10-CM

## 2021-02-03 DIAGNOSIS — F4010 Social phobia, unspecified: Secondary | ICD-10-CM

## 2021-02-03 DIAGNOSIS — Z87898 Personal history of other specified conditions: Secondary | ICD-10-CM

## 2021-02-03 DIAGNOSIS — F411 Generalized anxiety disorder: Secondary | ICD-10-CM

## 2021-02-03 DIAGNOSIS — Z630 Problems in relationship with spouse or partner: Secondary | ICD-10-CM
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
## 2021-02-17 ENCOUNTER — Ambulatory Visit (INDEPENDENT_AMBULATORY_CARE_PROVIDER_SITE_OTHER): Payer: BC Managed Care – PPO | Admitting: Psychiatry

## 2021-02-17 DIAGNOSIS — F411 Generalized anxiety disorder: Secondary | ICD-10-CM | POA: Diagnosis not present

## 2021-02-17 DIAGNOSIS — F4010 Social phobia, unspecified: Secondary | ICD-10-CM

## 2021-02-17 DIAGNOSIS — Z630 Problems in relationship with spouse or partner: Secondary | ICD-10-CM
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
## 2021-03-03 ENCOUNTER — Ambulatory Visit: Payer: BC Managed Care – PPO | Admitting: Psychiatry

## 2021-03-04 ENCOUNTER — Other Ambulatory Visit: Payer: Self-pay

## 2021-03-04 ENCOUNTER — Ambulatory Visit: Payer: BC Managed Care – PPO | Admitting: Psychiatry

## 2021-03-04 DIAGNOSIS — F4010 Social phobia, unspecified: Secondary | ICD-10-CM

## 2021-03-04 DIAGNOSIS — Z8659 Personal history of other mental and behavioral disorders: Secondary | ICD-10-CM

## 2021-03-04 DIAGNOSIS — F330 Major depressive disorder, recurrent, mild: Secondary | ICD-10-CM | POA: Diagnosis not present

## 2021-03-04 DIAGNOSIS — F411 Generalized anxiety disorder: Secondary | ICD-10-CM

## 2021-03-04 DIAGNOSIS — Z630 Problems in relationship with spouse or partner: Secondary | ICD-10-CM | POA: Diagnosis not present
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
## 2021-03-09 ENCOUNTER — Ambulatory Visit: Payer: BC Managed Care – PPO | Admitting: Psychiatry

## 2021-03-17 ENCOUNTER — Other Ambulatory Visit: Payer: Self-pay

## 2021-03-17 ENCOUNTER — Ambulatory Visit (INDEPENDENT_AMBULATORY_CARE_PROVIDER_SITE_OTHER): Payer: BC Managed Care – PPO | Admitting: Psychiatry

## 2021-03-17 DIAGNOSIS — F4010 Social phobia, unspecified: Secondary | ICD-10-CM

## 2021-03-17 DIAGNOSIS — Z630 Problems in relationship with spouse or partner: Secondary | ICD-10-CM

## 2021-03-17 DIAGNOSIS — Z8659 Personal history of other mental and behavioral disorders: Secondary | ICD-10-CM

## 2021-03-17 DIAGNOSIS — F330 Major depressive disorder, recurrent, mild: Secondary | ICD-10-CM | POA: Diagnosis not present

## 2021-03-17 DIAGNOSIS — F411 Generalized anxiety disorder: Secondary | ICD-10-CM | POA: Diagnosis not present

## 2021-03-17 NOTE — Progress Notes (Signed)
Psychotherapy Progress Note [facility] Marliss Czar, [name]  Patient ID: Kathleen Soto [facility] "Kathleen Soto")    MRN: [id number] Therapy format: Individual psychotherapy Date: 03/17/2021      Start: 9:08a     Stop: 9:56a     Time Spent: 48 min Location: In-person   Session narrative (presenting needs, interim history, self-report of stressors and symptoms, applications of prior therapy, status changes, and interventions made in session) Bit of a breakthrough with Kathleen Soto, who remains resistant but is working individually now with [employer].  Personally, practicing not taking the bait or conflict and preventing herself from jumping into action and trying to "manage" him.  Continue to use [name] attunement procedure to get better at emotional expression, listening, validating him, and working hard at sitting with criticisms and complaints.  Has been thinking over valid complaints of her blaming him for the state of the old house, recent exchange over him showing the house to a neighbor who may buy, dealing with layers of shame and resentment.  Noted how some of her offers of support and empathy get thrown back at her as evidence of her veiled attempts to shame him.  Very delicate sometimes.  Got the discussion into therapy, with some breakthrough in him acknowledging shame.  Martyring episode with conflicting plans, but he later admitted laying a trap for her to set up for the old charge of her always getting what she wants.  Past 3 days it's been substantially more amicable, some spontaneous validation from Kathleen Soto.  Worried whether there will be a "catch" soon, e.g, expectations of intimacy, and continuing understanding that he is still going unmedicated (on c/o Vyvanse giving him insomnia).  Ongoing difficulties with Kathleen Soto getting goofy, messing with her while at the computer, or coming up behind her and grabbing her breasts.  Affirmed and encouraged in using couples therapy tactics,  refraining from teaching Kathleen Soto, using active listening, and promptly admitting wrongs herself.  Therapeutic modalities: Cognitive Behavioral Therapy and Solution-Oriented/Positive Psychology  Mental Status/Observations:  Appearance:   Casual     Behavior:  Appropriate  Motor:  Normal  Speech/Language:   Clear and Coherent  Affect:  Appropriate  Mood:  anxious  Thought process:  normal  Thought content:    WNL  Sensory/Perceptual disturbances:    WNL  Orientation:  Fully oriented  Attention:  Good    Concentration:  Good  Memory:  WNL  Insight:    Good  Judgment:   Good  Impulse Control:  Fair   Risk Assessment: Danger to Self: No Self-injurious Behavior: No Danger to Others: No Physical Aggression / Violence: No Duty to Warn: No Access to Firearms a concern: No  Assessment of progress:  progressing  Diagnosis:   ICD-10-CM   1. Generalized anxiety disorder  F41.1     2. Social anxiety disorder  F40.10     3. Relationship problem between partners  Z63.0     4. Mild episode of recurrent major depressive disorder (HCC)  F33.0     5. History of stigmatizing psychiatric diagnosis  Z86.59      Plan:  Use couples therapy tactics including attunement Emphasize refraining from teaching Kathleen Soto or only explaining after an express willingness to listen Use active listening, and promptly admit wrongs herself Try to reward increments of friendlier behavior and vulnerability on Kathleen Soto's part Other recommendations/advice as may be noted above Continue to utilize previously learned skills ad lib Maintain medication as prescribed and work faithfully with relevant  prescriber(s) if any changes are desired or seem indicated Call the clinic on-call service, 988/hotline, present to ER, or call 911 if any life-threatening psychiatric crisis Return for session(s) already scheduled. Already scheduled visit in this office 03/31/2021.  Robley Fries, PhD Marliss Czar, [name] Clinical  Psychologist, [facility] [address] [place], [place] [address] [phone]

## 2021-03-31 ENCOUNTER — Other Ambulatory Visit: Payer: Self-pay

## 2021-03-31 ENCOUNTER — Ambulatory Visit: Payer: BC Managed Care – PPO | Admitting: Psychiatry

## 2021-03-31 DIAGNOSIS — F411 Generalized anxiety disorder: Secondary | ICD-10-CM

## 2021-03-31 DIAGNOSIS — F4010 Social phobia, unspecified: Secondary | ICD-10-CM

## 2021-03-31 DIAGNOSIS — F330 Major depressive disorder, recurrent, mild: Secondary | ICD-10-CM | POA: Diagnosis not present

## 2021-03-31 DIAGNOSIS — Z630 Problems in relationship with spouse or partner: Secondary | ICD-10-CM

## 2021-03-31 DIAGNOSIS — Z8659 Personal history of other mental and behavioral disorders: Secondary | ICD-10-CM

## 2021-03-31 NOTE — Progress Notes (Signed)
Psychotherapy Progress Note [facility] Kathleen Czar, [name]  Patient ID: Kathleen Soto [facility] "[name]")    MRN: [id number] Therapy format: Individual psychotherapy Date: 03/31/2021      Start: 8:14a     Stop: 9:04a     Time Spent: 50 min Location: In-person   Session narrative (presenting needs, interim history, self-report of stressors and symptoms, applications of prior therapy, status changes, and interventions made in session) Puppy training intense lately.  Political tension with FIL, but thankfully not H or son.  Continue in coordinated individual and marital therapy with [name].  Starting to worry whether she will be able to handle abiding childishness as [name] gets better at conflict and respect.  Renewing worry over whether she's passed or failed her track record in life.    Evaluated expectations for [name] change of attitude when it comes to seeing how she is doing and giving her credit.  Wrestling with conflicting voices in her life about how much is "enough" and "trying" to be good enough in respect and love, struggling enough to possibly set herself up to backtrack on conflict.  Eventually clarifies that he has been snarky and annoyingly playful a lot lately, and it puts her back through self-critical thought patterns she had much more strongly when she went to rehab for EDO and purported Borderline PD.  Therapeutic modalities: Cognitive Behavioral Therapy and Solution-Oriented/Positive Psychology  Mental Status/Observations:  Appearance:   Casual     Behavior:  Appropriate  Motor:  Normal  Speech/Language:   Clear and Coherent  Affect:  Appropriate  Mood:  anxious  Thought process:  normal  Thought content:    WNL  Sensory/Perceptual disturbances:    WNL  Orientation:  Fully oriented  Attention:  Good    Concentration:  Fair  Memory:  WNL  Insight:    Good  Judgment:   Good  Impulse Control:  Good   Risk Assessment: Danger to Self:  No Self-injurious Behavior: No Danger to Others: No Physical Aggression / Violence: No Duty to Warn: No Access to Firearms a concern: No  Assessment of progress:  progressing  Diagnosis:   ICD-10-CM   1. Generalized anxiety disorder  F41.1     2. Relationship problem between partners  Z63.0     3. Social anxiety disorder  F40.10     4. Mild episode of recurrent major depressive disorder (HCC)  F33.0     5. History of stigmatizing psychiatric diagnosis  Z86.59      Plan:  Use couples therapy tactics including attunement Emphasize refraining from teaching [name] or only explaining after an express willingness to listen Use active listening, and promptly admit wrongs herself Try to reward increments of friendlier behavior and vulnerability on [name] part Challenge perfectionistic thought patterns Other recommendations/advice as may be noted above Continue to utilize previously learned skills ad lib Maintain medication as prescribed and work faithfully with relevant prescriber(s) if any changes are desired or seem indicated Call the clinic on-call service, 988/hotline, 911, or present to [facility] or ER if any life-threatening psychiatric crisis Return for session(s) already scheduled. Already scheduled visit in this office 04/14/2021.  Robley Fries, PhD Kathleen Czar, [name] Clinical Psychologist, [facility] [address], [address] [place], [place] [address] [phone]

## 2021-04-14 ENCOUNTER — Ambulatory Visit: Payer: BC Managed Care – PPO | Admitting: Psychiatry

## 2021-04-14 ENCOUNTER — Other Ambulatory Visit: Payer: Self-pay

## 2021-04-14 DIAGNOSIS — Z630 Problems in relationship with spouse or partner: Secondary | ICD-10-CM

## 2021-04-14 DIAGNOSIS — F411 Generalized anxiety disorder: Secondary | ICD-10-CM | POA: Diagnosis not present

## 2021-04-14 DIAGNOSIS — Z8659 Personal history of other mental and behavioral disorders: Secondary | ICD-10-CM

## 2021-04-14 DIAGNOSIS — F4010 Social phobia, unspecified: Secondary | ICD-10-CM

## 2021-04-14 NOTE — Progress Notes (Signed)
Psychotherapy Progress Note [facility] Luan Moore, [name]  Patient ID: Kathleen Soto [facility] "Lesleigh Noe")    MRN: [id number] Therapy format: Individual psychotherapy Date: 04/14/2021      Start: 8:13a     Stop: 9:00a     Time Spent: 47 min Location: In-person   Session narrative (presenting needs, interim history, self-report of stressors and symptoms, applications of prior therapy, status changes, and interventions made in session) Marriage work going better at home, sees Konrad Dolores trying more, reckoning more with his own resentments and willingness to deal with his own anger, losing it less often.  31th anniv in March.  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  Has opened up more to [facility] about her struggles with diagnosis, what is personality (suggestion unchanging), ego-dystonic emotionality, and maybe (can't tell) how it is stigmatizing to her for hi  Trigger in news that Will and [name] are not coming home for Christmas.  Not angry, admittedly sad at first Christmas without him.  Indications clear enough he is still attached and cares, he is clearly acting on values he's been taught (making way as [name] tries to reconcile with her father), and the couple are actively working out how to balance family time in the future.  Backdrop issues in how no extended family made it to the wedding and how unsupported that looked, with history of Will feeling like a political outcast among many of [name]'s family anyway.    Affirmed how marital therapy shifted strategy productively to doing intentional affirmations and Konrad Dolores seems to be doing some spontaneously now and otherwise showing better consideration.  Still advise being ready to let him know that when he uses the "Borderline" label and tells her such-and-such behavior is part of her "borderline" problem, it is the single most hurtful, alienating way to try to get her attention on what he wants or  needs, and her biggest "ask" of him really is to say what he wants or feels, not who or how she is.  Also still advise making as short a work as possible of defending, explaining, or apologizing and seek always under stress to put things in terms of what will work better for [name] to get his own needs met with her.  Framed how it has long seemed that both of them are in a prolonged battle against feeling hopeless/helpless, and the tendency has been for each of them to try to get the other one to be the hope/help they need but in ways that each other finds untrustworthy or hurtful.  The "enemy" continues to be not so much either one's diagnosis (he finds ADHD pejorative) or personality, but the accuser/defender relationship frame they wind up in.  Therapeutic modalities: Cognitive Behavioral Therapy, Solution-Oriented/Positive Psychology, and Assertiveness/Communication  Mental Status/Observations:  Appearance:   Casual     Behavior:  Appropriate  Motor:  Normal  Speech/Language:   Clear and Coherent  Affect:  Appropriate  Mood:  anxious  Thought process:  normal and worrisome  Thought content:    worry  Sensory/Perceptual disturbances:    WNL  Orientation:  Fully oriented  Attention:  Good    Concentration:  Good  Memory:  WNL  Insight:    Good  Judgment:   Good  Impulse Control:  Good   Risk Assessment: Danger to Self: No Self-injurious Behavior: No Danger to Others: No Physical Aggression / Violence: No Duty to Warn: No Access to Firearms a concern: No  Assessment of progress:  progressing  Diagnosis:   ICD-10-CM   1. Social anxiety disorder  F40.10     2. Generalized anxiety disorder  F41.1     3. Relationship problem between partners  Z63.0     4. History of stigmatizing psychiatric diagnosis  Z86.59     5. History of anorexia, binge/purge type  Z86.59      Plan:  Continue to deemphasize explaining herself, spend conflict air time on what results [name] desired and  undesired behaviors get.  Possible agreement that both of them will try to stay off each other's mental health labels and keep it more about what they each want, and what works or hurts. Continue practices of any self-soothing skills, positive communication, emotional attunement Self-affirm it helpful to tell loved ones what it is she does feel when they worry what she may feel -- it is inherently trust-building to disclose when asked Other recommendations/advice as may be noted above Continue to utilize previously learned skills ad lib Maintain medication as prescribed and work faithfully with relevant prescriber(s) if any changes are desired or seem indicated Call the clinic on-call service, 988/hotline, 911, or present to [facility] or ER if any life-threatening psychiatric crisis Return for session(s) already scheduled. Already scheduled visit in this office 04/28/2021.  Blanchie Serve, PhD Luan Moore, [name] Clinical Psychologist, [facility] [address], [place], [place] [address] [phone]

## 2021-04-28 ENCOUNTER — Ambulatory Visit: Payer: BC Managed Care – PPO | Admitting: Psychiatry

## 2021-04-28 ENCOUNTER — Other Ambulatory Visit: Payer: Self-pay

## 2021-04-28 DIAGNOSIS — Z8659 Personal history of other mental and behavioral disorders: Secondary | ICD-10-CM

## 2021-04-28 DIAGNOSIS — Z630 Problems in relationship with spouse or partner: Secondary | ICD-10-CM | POA: Diagnosis not present

## 2021-04-28 DIAGNOSIS — F411 Generalized anxiety disorder: Secondary | ICD-10-CM | POA: Diagnosis not present

## 2021-04-28 DIAGNOSIS — F4010 Social phobia, unspecified: Secondary | ICD-10-CM | POA: Diagnosis not present

## 2021-04-28 DIAGNOSIS — Z87898 Personal history of other specified conditions: Secondary | ICD-10-CM
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
## 2021-05-21 ENCOUNTER — Other Ambulatory Visit: Payer: Self-pay

## 2021-05-21 ENCOUNTER — Ambulatory Visit (INDEPENDENT_AMBULATORY_CARE_PROVIDER_SITE_OTHER): Payer: BC Managed Care – PPO | Admitting: Psychiatry

## 2021-05-21 DIAGNOSIS — Z630 Problems in relationship with spouse or partner: Secondary | ICD-10-CM | POA: Diagnosis not present

## 2021-05-21 DIAGNOSIS — F411 Generalized anxiety disorder: Secondary | ICD-10-CM

## 2021-05-21 DIAGNOSIS — Z8659 Personal history of other mental and behavioral disorders: Secondary | ICD-10-CM | POA: Diagnosis not present

## 2021-05-21 DIAGNOSIS — F4010 Social phobia, unspecified: Secondary | ICD-10-CM

## 2021-05-21 DIAGNOSIS — F330 Major depressive disorder, recurrent, mild: Secondary | ICD-10-CM
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
## 2021-05-26 ENCOUNTER — Other Ambulatory Visit: Payer: Self-pay

## 2021-05-26 DIAGNOSIS — F330 Major depressive disorder, recurrent, mild: Secondary | ICD-10-CM

## 2021-05-26 MED ORDER — BUPROPION HCL ER (XL) 150 MG PO TB24: 150.0000 mg | ORAL_TABLET | Freq: Every day | ORAL | 1 refills | Status: DC

## 2021-05-26 MED ORDER — BUPROPION HCL ER (XL) 300 MG PO TB24: 300.0000 mg | ORAL_TABLET | Freq: Every morning | ORAL | 1 refills | Status: DC

## 2021-06-09 ENCOUNTER — Ambulatory Visit: Payer: BC Managed Care – PPO | Admitting: Psychiatry

## 2021-06-09 ENCOUNTER — Other Ambulatory Visit: Payer: Self-pay

## 2021-06-09 DIAGNOSIS — F4010 Social phobia, unspecified: Secondary | ICD-10-CM | POA: Diagnosis not present

## 2021-06-09 DIAGNOSIS — F411 Generalized anxiety disorder: Secondary | ICD-10-CM | POA: Diagnosis not present

## 2021-06-09 DIAGNOSIS — Z630 Problems in relationship with spouse or partner: Secondary | ICD-10-CM | POA: Diagnosis not present

## 2021-06-09 DIAGNOSIS — Z8659 Personal history of other mental and behavioral disorders: Secondary | ICD-10-CM

## 2021-06-09 DIAGNOSIS — Z87898 Personal history of other specified conditions: Secondary | ICD-10-CM
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
## 2021-06-11 ENCOUNTER — Encounter: Payer: Self-pay | Admitting: Psychiatry

## 2021-06-11 ENCOUNTER — Other Ambulatory Visit: Payer: Self-pay

## 2021-06-11 ENCOUNTER — Ambulatory Visit: Payer: BC Managed Care – PPO | Admitting: Psychiatry

## 2021-06-11 DIAGNOSIS — G4700 Insomnia, unspecified: Secondary | ICD-10-CM | POA: Diagnosis not present

## 2021-06-11 DIAGNOSIS — F4010 Social phobia, unspecified: Secondary | ICD-10-CM

## 2021-06-11 DIAGNOSIS — F411 Generalized anxiety disorder: Secondary | ICD-10-CM | POA: Diagnosis not present

## 2021-06-11 MED ORDER — CLONAZEPAM 0.5 MG PO TABS: ORAL_TABLET | ORAL | 1 refills | Status: DC

## 2021-06-11 NOTE — Progress Notes (Signed)
Kathleen Soto [id number] May 03, 1961 61 y.o.  Subjective:   Patient ID:  Kathleen Soto is a 61 y.o. (DOB [birth year]) female.  Chief Complaint:  Chief Complaint  Patient presents with   Anxiety   Follow-up    Depression    HPI Kathleen Soto presents to the office today for follow-up of anxiety and depression. She and her husband have been seeing a marital therapist and it "has been very hard." She reports that different things have "been coming up" for her. She reports that their relationship has been improving. She has been referred to a trauma therapist. She reprots that she has been having "a lot of physical symptoms" to include GI and some neuro s/s. She has noticed some word finding errors, tripping, and some falls. She reports that she has anticipatory anxiety about having GI s/s while riding in a car. Some intrusive memories. Denies re-experiencing events. She has had 1-2 nightmares/night terrors. She reports that she has chronic difficulty falling and staying asleep and attributes this to having a small bed.   She reports that her depression has been well controlled. She reports that her PCP discussed considering decrease in Wellbutrin. She reports that her motivation has been good. She reports that she gets "overwhelmed" and having difficulty being productive. She reports that her concentration has been ok. Appetite has been ok. She reports that she will sometimes forget to eat during the day. She reports that she has been having some hopeless thoughts. Denies SI.  Her PCP recommended a GI consult.   Son who is in the army will be going to [name]. Her youngest son is enlisting in the [employer]. She reports that she is also processing her sons leaving home and worry about her sons.   Has started a proofreading course.   Noticed manufacturer of Lamictal changed  Past Psychiatric Medication Trials: Zoloft Paxil-sexual side effects, weight  gain Luvox-ineffective Lexapro Prozac-sexual side effects Effexor Wellbutrin Latuda Abilify-effective Lamictal Strattera BuSpar Propranolol- Helped with tremor in the past. Had light-headedness at times with it. Remeron- Effective for insomnia Klonopin      Review of Systems:  Review of Systems  Gastrointestinal:  Positive for nausea. Negative for vomiting.  Musculoskeletal:  Negative for gait problem.  Neurological:  Negative for tremors.  Psychiatric/Behavioral:         Please refer to HPI   Medications: I have reviewed the patient's current medications.  Current Outpatient Medications  Medication Sig Dispense Refill   buPROPion (WELLBUTRIN XL) 300 MG 24 hr tablet Take 1 tablet (300 mg total) by mouth every morning. Take with 150 mg tablet to equal total dose of 450 mg 90 tablet 1   ezetimibe (ZETIA) 10 MG tablet Take 10 mg by mouth daily.     Famotidine (PEPCID PO) Take by mouth daily at 12 noon.     levalbuterol (XOPENEX HFA) 45 MCG/ACT inhaler Inhale into the lungs every 4 (four) hours as needed for wheezing.     levocetirizine (XYZAL) 5 MG tablet Take 5 mg by mouth every evening.     Levothyroxine Sodium 100 MCG CAPS Take 100 mcg by mouth daily.      methocarbamol (ROBAXIN) 500 MG tablet Take 500 mg by mouth every 6 (six) hours as needed for muscle spasms.     montelukast (SINGULAIR) 10 MG tablet Take 10 mg by mouth at bedtime.     Omalizumab (XOLAIR [place]) Inject into the skin.     ondansetron (ZOFRAN) 4 MG tablet  Take by mouth at bedtime.     pantoprazole (PROTONIX) 40 MG tablet Take 40 mg by mouth daily.     clonazePAM (KLONOPIN) 0.5 MG tablet Take 1/2-1 tab po BID prn anxiety 45 tablet 1   desloratadine (CLARINEX) 5 MG tablet Take 5 mg by mouth daily. (Patient not taking: Reported on 11/11/2020)     lamoTRIgine (LAMICTAL) 150 MG tablet Take 1 tablet (150 mg total) by mouth daily. 90 tablet 1   No current facility-administered medications for this visit.     Medication Side Effects: Other: Possible worsening anxiety with Wellbutrin XL.  Allergies:  Allergies  Allergen Reactions   Prednisone     Esophageal spasms   Reglan [Metoclopramide]     Reports adverse reaction (cognitive side effects) to IV Reglan   Sulfonamide Derivatives     REACTION: mouth ulcers    Past Medical History:  Diagnosis Date   Depression    GERD (gastroesophageal reflux disease)    Hyperlipidemia    Thyroid disease    Urticaria, chronic     Past Medical History, Surgical history, Social history, and Family history were reviewed and updated as appropriate.   Please see review of systems for further details on the patient's review from today.   Objective:   Physical Exam:  There were no vitals taken for this visit.  Physical Exam Constitutional:      General: She is not in acute distress. Musculoskeletal:        General: No deformity.  Neurological:     Mental Status: She is alert and oriented to person, place, and time.     Coordination: Coordination normal.  Psychiatric:        Attention and Perception: Attention and perception normal. She does not perceive auditory or visual hallucinations.        Mood and Affect: Mood is anxious. Mood is not depressed. Affect is not labile, blunt, angry or inappropriate.        Speech: Speech normal.        Behavior: Behavior normal.        Thought Content: Thought content normal. Thought content is not paranoid or delusional. Thought content does not include homicidal or suicidal ideation. Thought content does not include homicidal or suicidal plan.        Cognition and Memory: Cognition and memory normal.        Judgment: Judgment normal.     Comments: Insight intact    Lab Review:  No results found for: NA, K, CL, CO2, GLUCOSE, BUN, CREATININE, CALCIUM, PROT, ALBUMIN, AST, ALT, ALKPHOS, BILITOT, GFRNONAA, GFRAA  No results found for: WBC, RBC, HGB, HCT, PLT, MCV, MCH, MCHC, RDW, LYMPHSABS, MONOABS,  EOSABS, BASOSABS  No results found for: POCLITH, LITHIUM   No results found for: PHENYTOIN, PHENOBARB, VALPROATE, CBMZ   .res Assessment: Plan:   Pt seen for 30 minutes and time spent discussing potential benefits and risks of decreasing Wellbutrin XL since it may be causing increased activation and anxiety at higher doses. Pt agrees to decrease in Wellbutrin XL to 300 mg po qd for depression.  Discussed potential benefits, risks, and side effects of Viibryd. Discussed that Viibryd is low risk for weight gain and sexual side effects. Discussed that it is approved for anxiety and depression. Pt reports that she would considering starting Viibryd.  Discussed reducing Wellbutrin XL at this time and re-evaluating in 4-6 weeks and consider starting Viibryd at that time based on response to reduction in Wellbutrin XL.  Discussed using Klonopin short term to help with acute anxiety. Will increase Klonopin to 0.5 mg 1/2-1 tab po BID prn anxiety.  Recommend continuing therapy.  Pt to follow-up with this provider in 4-6 weeks or sooner if clinically indicated.  Patient advised to contact office with any questions, adverse effects, or acute worsening in signs and symptoms.   Kathleen Soto was seen today for anxiety and follow-up.  Diagnoses and all orders for this visit:  Social anxiety disorder -     clonazePAM (KLONOPIN) 0.5 MG tablet; Take 1/2-1 tab po BID prn anxiety  Generalized anxiety disorder -     clonazePAM (KLONOPIN) 0.5 MG tablet; Take 1/2-1 tab po BID prn anxiety  Insomnia, unspecified type -     clonazePAM (KLONOPIN) 0.5 MG tablet; Take 1/2-1 tab po BID prn anxiety     Please see After Visit Summary for patient specific instructions.  Future Appointments  Date Time Provider Department Center  06/30/2021  8:00 AM Robley Fries, PhD [facility] None  07/21/2021  8:00 AM Robley Fries, PhD [facility] None  07/21/2021 10:00 AM Corie Chiquito, PMHNP [facility] None    No orders of the defined types  were placed in this encounter.   -------------------------------

## 2021-06-30 ENCOUNTER — Other Ambulatory Visit: Payer: Self-pay

## 2021-06-30 ENCOUNTER — Ambulatory Visit (INDEPENDENT_AMBULATORY_CARE_PROVIDER_SITE_OTHER): Payer: BC Managed Care – PPO | Admitting: Psychiatry

## 2021-06-30 DIAGNOSIS — Z87898 Personal history of other specified conditions: Secondary | ICD-10-CM

## 2021-06-30 DIAGNOSIS — F4010 Social phobia, unspecified: Secondary | ICD-10-CM | POA: Diagnosis not present

## 2021-06-30 DIAGNOSIS — Z630 Problems in relationship with spouse or partner: Secondary | ICD-10-CM | POA: Diagnosis not present

## 2021-06-30 DIAGNOSIS — Z8659 Personal history of other mental and behavioral disorders: Secondary | ICD-10-CM

## 2021-06-30 DIAGNOSIS — F411 Generalized anxiety disorder: Secondary | ICD-10-CM

## 2021-06-30 DIAGNOSIS — F331 Major depressive disorder, recurrent, moderate: Secondary | ICD-10-CM | POA: Diagnosis not present
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
## 2021-07-21 ENCOUNTER — Ambulatory Visit (INDEPENDENT_AMBULATORY_CARE_PROVIDER_SITE_OTHER): Payer: BC Managed Care – PPO | Admitting: Psychiatry

## 2021-07-21 ENCOUNTER — Other Ambulatory Visit: Payer: Self-pay

## 2021-07-21 ENCOUNTER — Encounter: Payer: Self-pay | Admitting: Psychiatry

## 2021-07-21 ENCOUNTER — Ambulatory Visit: Payer: BC Managed Care – PPO | Admitting: Psychiatry

## 2021-07-21 DIAGNOSIS — Z87898 Personal history of other specified conditions: Secondary | ICD-10-CM

## 2021-07-21 DIAGNOSIS — Z8659 Personal history of other mental and behavioral disorders: Secondary | ICD-10-CM

## 2021-07-21 DIAGNOSIS — F411 Generalized anxiety disorder: Secondary | ICD-10-CM

## 2021-07-21 DIAGNOSIS — F4010 Social phobia, unspecified: Secondary | ICD-10-CM

## 2021-07-21 DIAGNOSIS — Z630 Problems in relationship with spouse or partner: Secondary | ICD-10-CM | POA: Diagnosis not present

## 2021-07-21 DIAGNOSIS — F330 Major depressive disorder, recurrent, mild: Secondary | ICD-10-CM | POA: Diagnosis not present

## 2021-07-21 MED ORDER — LAMOTRIGINE 150 MG PO TABS: 150.0000 mg | ORAL_TABLET | Freq: Every day | ORAL | 1 refills | Status: DC
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
## 2021-07-21 NOTE — Progress Notes (Signed)
Midge Minium Hornbaker ?[id number] ?10-05-1960 ?61 y.o. ? ?Subjective:  ? ?Patient ID:  Kathleen Soto is a 61 y.o. (DOB January 18, 1961) female. ? ?Chief Complaint:  ?Chief Complaint  ?Patient presents with  ? Follow-up  ?  Anxiety, depression  ? ? ?HPI ?Kathleen Soto presents to the office today for follow-up of anxiety and depression. Kathleen Soto reports that her stomach issues are better overall but continues to have GI s/s with stress. Kathleen Soto saw GI and will have a colonoscopy next month. Kathleen Soto was advised to avoid lactose and this has seemed to help. Kathleen Soto continues to have difficulty with word finding. Kathleen Soto reports that Kathleen Soto has not been as clumsy. Kathleen Soto reports that Kathleen Soto has been less shaky. Continues to notice some shakiness with writing. ? ?Kathleen Soto reports that intrusive memories have improved some. Denies any recent nightmares or night terrors. Kathleen Soto reports some middle of the night awakenings. Energy is ok. Kathleen Soto reports feeling overwhelmed at times. Has anxiety when something unexpected happens. Kathleen Soto reports that her motivation is good and "will overload my plate." Kathleen Soto reports that Kathleen Soto will start a task and jump to another task before completing it. Kathleen Soto reports that Kathleen Soto is easily distracted. Appetite has been ok. Denies SI.  ?  ?Kathleen Soto reports that Klonopin has been helpful. Kathleen Soto has been taking 1/2 tab of Klonopin BID. Kathleen Soto has some anxiety with travel and traveled recently and this went ok.  ? ?"I don't really want to start another antidepressant." Kathleen Soto reports, "I would rather feel anxious than depressed."  ? ?Kathleen Soto plans to start trauma therapy with Mickel Duhamel.  ? ?Kathleen Soto and her husband just celebrated their 30th anniversary.  ? ? ?Review of Systems:  ?Review of Systems  ?Gastrointestinal:  Positive for diarrhea and nausea.  ?Neurological:   ?     Kathleen Soto reports improved tremor  ? ?Medications: I have reviewed the patient's current medications. ? ?Current Outpatient Medications  ?Medication Sig Dispense Refill  ? dicyclomine (BENTYL) 10 MG  capsule Take 10 mg by mouth in the morning, at noon, in the evening, and at bedtime.    ? buPROPion (WELLBUTRIN XL) 300 MG 24 hr tablet Take 1 tablet (300 mg total) by mouth every morning. Take with 150 mg tablet to equal total dose of 450 mg 90 tablet 1  ? clonazePAM (KLONOPIN) 0.5 MG tablet Take 1/2-1 tab po BID prn anxiety 45 tablet 1  ? desloratadine (CLARINEX) 5 MG tablet Take 5 mg by mouth daily. (Patient not taking: Reported on 11/11/2020)    ? ezetimibe (ZETIA) 10 MG tablet Take 10 mg by mouth daily.    ? Famotidine (PEPCID PO) Take by mouth daily at 12 noon.    ? lamoTRIgine (LAMICTAL) 150 MG tablet Take 1 tablet (150 mg total) by mouth daily. 90 tablet 1  ? levalbuterol (XOPENEX HFA) 45 MCG/ACT inhaler Inhale into the lungs every 4 (four) hours as needed for wheezing.    ? levocetirizine (XYZAL) 5 MG tablet Take 5 mg by mouth every evening.    ? Levothyroxine Sodium 100 MCG CAPS Take 100 mcg by mouth daily.     ? methocarbamol (ROBAXIN) 500 MG tablet Take 500 mg by mouth every 6 (six) hours as needed for muscle spasms.    ? montelukast (SINGULAIR) 10 MG tablet Take 10 mg by mouth at bedtime.    ? Omalizumab (XOLAIR [place]) Inject into the skin.    ? ondansetron (ZOFRAN) 4 MG tablet Take by mouth at bedtime.    ?  pantoprazole (PROTONIX) 40 MG tablet Take 40 mg by mouth daily.    ? ?No current facility-administered medications for this visit.  ? ? ?Medication Side Effects: None ? ?Allergies:  ?Allergies  ?Allergen Reactions  ? Prednisone   ?  Esophageal spasms  ? Reglan [Metoclopramide]   ?  Reports adverse reaction (cognitive side effects) to IV Reglan  ? Sulfonamide Derivatives   ?  REACTION: mouth ulcers  ? ? ?Past Medical History:  ?Diagnosis Date  ? Depression   ? GERD (gastroesophageal reflux disease)   ? Hyperlipidemia   ? Thyroid disease   ? Urticaria, chronic   ? ? ?Past Medical History, Surgical history, Social history, and Family history were reviewed and updated as appropriate.  ? ?Please see review  of systems for further details on the patient's review from today.  ? ?Objective:  ? ?Physical Exam:  ?There were no vitals taken for this visit. ? ?Physical Exam ?Constitutional:   ?   General: Kathleen Soto is not in acute distress. ?Musculoskeletal:     ?   General: No deformity.  ?Neurological:  ?   Mental Status: Kathleen Soto is alert and oriented to person, place, and time.  ?   Coordination: Coordination normal.  ?Psychiatric:     ?   Attention and Perception: Attention and perception normal. Kathleen Soto does not perceive auditory or visual hallucinations.     ?   Mood and Affect: Affect is not labile, blunt, angry or inappropriate.     ?   Speech: Speech normal.     ?   Behavior: Behavior normal.     ?   Thought Content: Thought content normal. Thought content is not paranoid or delusional. Thought content does not include homicidal or suicidal ideation. Thought content does not include homicidal or suicidal plan.     ?   Cognition and Memory: Cognition and memory normal.     ?   Judgment: Judgment normal.  ?   Comments: Insight intact ?Mood presents as less anxious compared to last exam  ? ? ?Lab Review:  ?No results found for: NA, K, CL, CO2, GLUCOSE, BUN, CREATININE, CALCIUM, PROT, ALBUMIN, AST, ALT, ALKPHOS, BILITOT, GFRNONAA, GFRAA ? ?No results found for: WBC, RBC, HGB, HCT, PLT, MCV, MCH, MCHC, RDW, LYMPHSABS, MONOABS, EOSABS, BASOSABS ? ?No results found for: POCLITH, LITHIUM  ? ?No results found for: PHENYTOIN, PHENOBARB, VALPROATE, CBMZ  ? ?.res ?Assessment: Plan:   ? ?Pt seen for 30 minutes and time spent discussing response to decrease in Wellbutrin and treatment plan. Discussed that anxiety has improved since decrease in Wellbutrin XL from 450 mg to 300 mg daily and recommend continuing 300 mg dose at this time. Pt is in agreement with this plan.  ?Kathleen Soto reports that Kathleen Soto does not wish to start a new antidepressant at this time. ?Continue Klonopin 0.5 mg 1/2-1 tab po BID prn anxiety. ?Continue Lamictal 150 mg po qd for mood  s/s.  ?Pt to follow-up with this provider in 2 months or sooner if clinically indicated.  ?Patient advised to contact office with any questions, adverse effects, or acute worsening in signs and symptoms. ? ? ?Ferryn was seen today for follow-up. ? ?Diagnoses and all orders for this visit: ? ?Generalized anxiety disorder ? ?Mild episode of recurrent major depressive disorder (HCC) ?-     lamoTRIgine (LAMICTAL) 150 MG tablet; Take 1 tablet (150 mg total) by mouth daily. ? ?  ? ?Please see After Visit Summary for patient specific instructions. ? ?  Future Appointments  ?Date Time Provider [place]  ?09/20/2021 10:00 AM Thayer Headings, PMHNP CP-[facility] None  ? ? ?No orders of the defined types were placed in this encounter. ? ? ?------------------------------- ?

## 2021-09-20 ENCOUNTER — Ambulatory Visit: Payer: BC Managed Care – PPO | Admitting: Psychiatry

## 2021-09-22 DIAGNOSIS — F3342 Major depressive disorder, recurrent, in full remission: Secondary | ICD-10-CM | POA: Diagnosis not present

## 2021-09-22 DIAGNOSIS — E039 Hypothyroidism, unspecified: Secondary | ICD-10-CM | POA: Diagnosis not present

## 2021-09-22 DIAGNOSIS — Z0000 Encounter for general adult medical examination without abnormal findings: Secondary | ICD-10-CM | POA: Diagnosis not present

## 2021-09-22 DIAGNOSIS — E559 Vitamin D deficiency, unspecified: Secondary | ICD-10-CM | POA: Diagnosis not present

## 2021-09-22 DIAGNOSIS — R002 Palpitations: Secondary | ICD-10-CM | POA: Diagnosis not present

## 2021-09-22 DIAGNOSIS — E782 Mixed hyperlipidemia: Secondary | ICD-10-CM | POA: Diagnosis not present

## 2021-09-22 DIAGNOSIS — Z130 Encounter for screening for diseases of the blood and blood-forming organs and certain disorders involving the immune mechanism: Secondary | ICD-10-CM | POA: Diagnosis not present

## 2021-09-22 DIAGNOSIS — K219 Gastro-esophageal reflux disease without esophagitis: Secondary | ICD-10-CM | POA: Diagnosis not present

## 2021-09-28 ENCOUNTER — Other Ambulatory Visit: Payer: Self-pay

## 2021-09-28 DIAGNOSIS — F4010 Social phobia, unspecified: Secondary | ICD-10-CM

## 2021-09-28 DIAGNOSIS — G4700 Insomnia, unspecified: Secondary | ICD-10-CM

## 2021-09-28 DIAGNOSIS — F411 Generalized anxiety disorder: Secondary | ICD-10-CM

## 2021-09-28 MED ORDER — CLONAZEPAM 0.5 MG PO TABS: ORAL_TABLET | ORAL | 1 refills | Status: DC

## 2021-10-06 ENCOUNTER — Ambulatory Visit: Payer: BC Managed Care – PPO | Admitting: Psychiatry

## 2021-10-12 DIAGNOSIS — E063 Autoimmune thyroiditis: Secondary | ICD-10-CM | POA: Diagnosis not present

## 2021-10-13 ENCOUNTER — Encounter: Payer: Self-pay | Admitting: Psychiatry

## 2021-10-13 ENCOUNTER — Telehealth: Payer: BC Managed Care – PPO | Admitting: Psychiatry

## 2021-10-13 DIAGNOSIS — G4700 Insomnia, unspecified: Secondary | ICD-10-CM | POA: Diagnosis not present

## 2021-10-13 DIAGNOSIS — F330 Major depressive disorder, recurrent, mild: Secondary | ICD-10-CM | POA: Diagnosis not present

## 2021-10-13 DIAGNOSIS — F411 Generalized anxiety disorder: Secondary | ICD-10-CM | POA: Diagnosis not present

## 2021-10-13 DIAGNOSIS — F4010 Social phobia, unspecified: Secondary | ICD-10-CM

## 2021-10-13 MED ORDER — CLONAZEPAM 0.5 MG PO TABS: ORAL_TABLET | ORAL | 2 refills | Status: DC

## 2021-10-13 MED ORDER — BUPROPION HCL ER (XL) 300 MG PO TB24: 300.0000 mg | ORAL_TABLET | Freq: Every morning | ORAL | 1 refills | Status: DC

## 2021-10-13 MED ORDER — LAMOTRIGINE 150 MG PO TABS: 150.0000 mg | ORAL_TABLET | Freq: Every day | ORAL | 1 refills | Status: DC

## 2021-10-13 NOTE — Progress Notes (Signed)
Kathleen Soto [id number] [birth year] 61 y.o.  Virtual Visit via Video Note  I connected with pt @ on 10/13/21 at  9:30 AM EDT by a video enabled telemedicine application and verified that I am speaking with the correct person using two identifiers.   I discussed the limitations of evaluation and management by telemedicine and the availability of in person appointments. The patient expressed understanding and agreed to proceed.  I discussed the assessment and treatment plan with the patient. The patient was provided an opportunity to ask questions and all were answered. The patient agreed with the plan and demonstrated an understanding of the instructions.   The patient was advised to call back or seek an in-person evaluation if the symptoms worsen or if the condition fails to improve as anticipated.  I provided 30 minutes of non-face-to-face time during this encounter.  The patient was located at home.  The provider was located at home.   Corie Chiquito, PMHNP   Subjective:   Patient ID:  Kathleen Soto is a 61 y.o. (DOB [birth year]) female.  Chief Complaint:  Chief Complaint  Patient presents with   Follow-up    Depression, anxiety    HPI Kathleen Soto presents for follow-up of anxiety and depression. She reports, "Emotionally, I'm basically ok... my meds seem to be doing ok." She reports that they are continuing to work in marital therapy and that they are seeing improvements in their marriage. She reports that her sleep varies. She reports that she sleeps better when she is by herself. She reports that their bed situation has improved. She reports that she wakes up when husband turns over. She has difficulty sleeping when she feels nauseous. She reports chronic GI s/s that are exacerbated by anxiety. She reports gaining 8 lbs without change in intake. She reports earlier satiety. She reports possible IBS s/s and is scheduled for colonoscopy. She reports some anxiety in  response to stressors and intense therapy work. She reports some occ mild depression when feeling overwhelmed and behind on things. Energy and motivation have been ok overall. "As long as I stay productive, I feel confident and competent." Concentration is "not great" and will periodically forget things. Denies SI.   She reports taking Klonopin 1/2 tab BID.   They have been traveling some. Oldest son and his wife moved to [name] for a few years.Youngest son will be leaving for [employer] in about a month. She and her son are taking a trip to [facility] before he leaves. They will be going to [place] in early July and is concerned about anxiety and nausea. Niece that is 67 yo and has Cerebral Palsy. She has also gotten trained to help niece some.   She has been active with her church.  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   Past Psychiatric Medication Trials: Zoloft Paxil-sexual side effects, weight gain Luvox-ineffective Lexapro Prozac-sexual side effects Effexor Wellbutrin Latuda Abilify-effective Lamictal Strattera BuSpar Propranolol- Helped with tremor in the past. Had light-headedness at times with it. Remeron- Effective for insomnia Klonopin  Review of Systems:  Review of Systems  Gastrointestinal:  Positive for abdominal pain, constipation, diarrhea and nausea.  Musculoskeletal:  Negative for gait problem.  Neurological:  Negative for tremors.  Psychiatric/Behavioral:         Please refer to HPI   Medications: I have reviewed the patient's current medications.  Current Outpatient Medications  Medication Sig Dispense Refill   desloratadine (CLARINEX) 5 MG tablet  Take 5 mg by mouth daily.     dicyclomine (BENTYL) 10 MG capsule Take 10 mg by mouth in the morning, at noon, in the evening, and at bedtime.     ezetimibe (ZETIA) 10 MG tablet Take 10 mg by mouth daily.     Famotidine (PEPCID PO) Take by mouth daily at 12 noon.     levocetirizine (XYZAL) 5 MG tablet Take  5 mg by mouth every evening.     Levothyroxine Sodium 100 MCG CAPS Take 100 mcg by mouth daily.      montelukast (SINGULAIR) 10 MG tablet Take 10 mg by mouth at bedtime.     Omalizumab (XOLAIR [place]) Inject into the skin.     ondansetron (ZOFRAN) 4 MG tablet Take by mouth at bedtime.     pantoprazole (PROTONIX) 40 MG tablet Take 40 mg by mouth daily.     rosuvastatin (CRESTOR) 5 MG tablet Take 5 mg by mouth daily.     buPROPion (WELLBUTRIN XL) 300 MG 24 hr tablet Take 1 tablet (300 mg total) by mouth every morning. 90 tablet 1   [START ON 10/26/2021] clonazePAM (KLONOPIN) 0.5 MG tablet Take 1/2-1 tab po BID prn anxiety 60 tablet 2   lamoTRIgine (LAMICTAL) 150 MG tablet Take 1 tablet (150 mg total) by mouth daily. 90 tablet 1   levalbuterol (XOPENEX HFA) 45 MCG/ACT inhaler Inhale into the lungs every 4 (four) hours as needed for wheezing.     No current facility-administered medications for this visit.    Medication Side Effects: None  Allergies:  Allergies  Allergen Reactions   Prednisone     Esophageal spasms   Reglan [Metoclopramide]     Reports adverse reaction (cognitive side effects) to IV Reglan   Sulfonamide Derivatives     REACTION: mouth ulcers    Past Medical History:  Diagnosis Date   Depression    GERD (gastroesophageal reflux disease)    Hyperlipidemia    Thyroid disease    Urticaria, chronic     Family History  Problem Relation Age of Onset   Thyroid disease Sister    Cancer Father    Thyroid disease Father    Depression Father    Cancer Brother    Parkinson's disease Mother    Alcohol abuse Paternal Grandfather    Alcohol abuse Sister     Social History   Socioeconomic History   Marital status: Married    Spouse name: Not on file   Number of children: Not on file   Years of education: Not on file   Highest education level: Not on file  Occupational History   Not on file  Tobacco Use   Smoking status: Never   Smokeless tobacco: Never  Substance  and Sexual Activity   Alcohol use: Yes    Comment: rarely   Drug use: No   Sexual activity: Not on file  Other Topics Concern   Not on file  Social History Narrative   Not on file   Social Determinants of Health   Financial Resource Strain: Not on file  Food Insecurity: Not on file  Transportation Needs: Not on file  Physical Activity: Not on file  Stress: Not on file  Social Connections: Not on file  Intimate Partner Violence: Not on file    Past Medical History, Surgical history, Social history, and Family history were reviewed and updated as appropriate.   Please see review of systems for further details on the patient's review from today.  Objective:   Physical Exam:  There were no vitals taken for this visit.  Physical Exam Neurological:     Mental Status: She is alert and oriented to person, place, and time.     Cranial Nerves: No dysarthria.  Psychiatric:        Attention and Perception: Attention and perception normal.        Mood and Affect: Mood is anxious.        Speech: Speech normal.        Behavior: Behavior is cooperative.        Thought Content: Thought content normal. Thought content is not paranoid or delusional. Thought content does not include homicidal or suicidal ideation. Thought content does not include homicidal or suicidal plan.        Cognition and Memory: Cognition and memory normal.        Judgment: Judgment normal.     Comments: Insight intact Mood presents as less anxious compared to last exam    Lab Review:  No results found for: NA, K, CL, CO2, GLUCOSE, BUN, CREATININE, CALCIUM, PROT, ALBUMIN, AST, ALT, ALKPHOS, BILITOT, GFRNONAA, GFRAA  No results found for: WBC, RBC, HGB, HCT, PLT, MCV, MCH, MCHC, RDW, LYMPHSABS, MONOABS, EOSABS, BASOSABS  No results found for: POCLITH, LITHIUM   No results found for: PHENYTOIN, PHENOBARB, VALPROATE, CBMZ   .res Assessment: Plan:    Pt seen for 30 minutes and time spent reviewing treatment  plan. She requests that quantity of Klonopin be increased from #45 to #60 while she is traveling over the next few months since she has anxiety about not having adequate supply. Agreed to increase Klonopin quantity to #60 over the summer.  Will continue Wellbutrin XL 300 mg po qd for depression since lower dose seems less likely to exacerbate anxiety and her depression remains fairly well controlled.  Continue Lamictal 150 mg po qd for mood symptoms.  Pt to follow-up in 3 months or sooner if clinically indicated.  Patient advised to contact office with any questions, adverse effects, or acute worsening in signs and symptoms.   Kathleen Soto was seen today for follow-up.  Diagnoses and all orders for this visit:  Social anxiety disorder -     clonazePAM (KLONOPIN) 0.5 MG tablet; Take 1/2-1 tab po BID prn anxiety  Generalized anxiety disorder -     clonazePAM (KLONOPIN) 0.5 MG tablet; Take 1/2-1 tab po BID prn anxiety  Insomnia, unspecified type -     clonazePAM (KLONOPIN) 0.5 MG tablet; Take 1/2-1 tab po BID prn anxiety  Mild episode of recurrent major depressive disorder (HCC) -     buPROPion (WELLBUTRIN XL) 300 MG 24 hr tablet; Take 1 tablet (300 mg total) by mouth every morning. -     lamoTRIgine (LAMICTAL) 150 MG tablet; Take 1 tablet (150 mg total) by mouth daily.     Please see After Visit Summary for patient specific instructions.  Future Appointments  Date Time Provider Department Center  01/19/2022  8:30 AM Corie Chiquito, PMHNP CP-CP None    No orders of the defined types were placed in this encounter.     -------------------------------

## 2021-11-01 DIAGNOSIS — J208 Acute bronchitis due to other specified organisms: Secondary | ICD-10-CM | POA: Diagnosis not present

## 2021-11-01 DIAGNOSIS — R059 Cough, unspecified: Secondary | ICD-10-CM | POA: Diagnosis not present

## 2021-11-01 DIAGNOSIS — B9689 Other specified bacterial agents as the cause of diseases classified elsewhere: Secondary | ICD-10-CM | POA: Diagnosis not present

## 2021-11-24 DIAGNOSIS — R059 Cough, unspecified: Secondary | ICD-10-CM | POA: Diagnosis not present

## 2021-11-25 DIAGNOSIS — Z90710 Acquired absence of both cervix and uterus: Secondary | ICD-10-CM | POA: Diagnosis not present

## 2021-11-25 DIAGNOSIS — Z01419 Encounter for gynecological examination (general) (routine) without abnormal findings: Secondary | ICD-10-CM | POA: Diagnosis not present

## 2021-11-25 DIAGNOSIS — Z1272 Encounter for screening for malignant neoplasm of vagina: Secondary | ICD-10-CM | POA: Diagnosis not present

## 2021-12-06 DIAGNOSIS — J45990 Exercise induced bronchospasm: Secondary | ICD-10-CM | POA: Diagnosis not present

## 2021-12-06 DIAGNOSIS — L508 Other urticaria: Secondary | ICD-10-CM | POA: Diagnosis not present

## 2021-12-09 DIAGNOSIS — K219 Gastro-esophageal reflux disease without esophagitis: Secondary | ICD-10-CM | POA: Diagnosis not present

## 2021-12-09 DIAGNOSIS — K648 Other hemorrhoids: Secondary | ICD-10-CM | POA: Diagnosis not present

## 2021-12-09 DIAGNOSIS — D124 Benign neoplasm of descending colon: Secondary | ICD-10-CM | POA: Diagnosis not present

## 2021-12-09 DIAGNOSIS — K317 Polyp of stomach and duodenum: Secondary | ICD-10-CM | POA: Diagnosis not present

## 2021-12-09 DIAGNOSIS — R197 Diarrhea, unspecified: Secondary | ICD-10-CM | POA: Diagnosis not present

## 2021-12-20 DIAGNOSIS — Z860100 Personal history of colon polyps, unspecified: Secondary | ICD-10-CM | POA: Insufficient documentation

## 2022-01-03 DIAGNOSIS — R0981 Nasal congestion: Secondary | ICD-10-CM | POA: Diagnosis not present

## 2022-01-03 DIAGNOSIS — J45990 Exercise induced bronchospasm: Secondary | ICD-10-CM | POA: Diagnosis not present

## 2022-01-10 DIAGNOSIS — K581 Irritable bowel syndrome with constipation: Secondary | ICD-10-CM | POA: Diagnosis not present

## 2022-01-12 DIAGNOSIS — Z780 Asymptomatic menopausal state: Secondary | ICD-10-CM | POA: Diagnosis not present

## 2022-01-12 DIAGNOSIS — M810 Age-related osteoporosis without current pathological fracture: Secondary | ICD-10-CM | POA: Diagnosis not present

## 2022-01-12 DIAGNOSIS — Z1231 Encounter for screening mammogram for malignant neoplasm of breast: Secondary | ICD-10-CM | POA: Diagnosis not present

## 2022-01-12 DIAGNOSIS — M8588 Other specified disorders of bone density and structure, other site: Secondary | ICD-10-CM | POA: Diagnosis not present

## 2022-01-19 ENCOUNTER — Telehealth (INDEPENDENT_AMBULATORY_CARE_PROVIDER_SITE_OTHER): Payer: BC Managed Care – PPO | Admitting: Psychiatry

## 2022-01-19 ENCOUNTER — Encounter: Payer: Self-pay | Admitting: Psychiatry

## 2022-01-19 DIAGNOSIS — F411 Generalized anxiety disorder: Secondary | ICD-10-CM

## 2022-01-19 DIAGNOSIS — F4010 Social phobia, unspecified: Secondary | ICD-10-CM | POA: Diagnosis not present

## 2022-01-19 DIAGNOSIS — F3341 Major depressive disorder, recurrent, in partial remission: Secondary | ICD-10-CM

## 2022-01-19 DIAGNOSIS — G4700 Insomnia, unspecified: Secondary | ICD-10-CM | POA: Diagnosis not present

## 2022-01-19 MED ORDER — BUPROPION HCL ER (XL) 300 MG PO TB24: 300.0000 mg | ORAL_TABLET | Freq: Every morning | ORAL | 1 refills | Status: DC

## 2022-01-19 MED ORDER — CLONAZEPAM 0.5 MG PO TABS: ORAL_TABLET | ORAL | 0 refills | Status: DC

## 2022-01-19 MED ORDER — LAMOTRIGINE 150 MG PO TABS: 150.0000 mg | ORAL_TABLET | Freq: Every day | ORAL | 1 refills | Status: DC

## 2022-01-19 NOTE — Progress Notes (Signed)
Kathleen Soto [id number] 05-16-1961 61 y.o.  Virtual Visit via Video Note  I connected with pt @ on 01/19/22 at  8:30 AM EDT by a video enabled telemedicine application and verified that I am speaking with the correct person using two identifiers.   I discussed the limitations of evaluation and management by telemedicine and the availability of in person appointments. The patient expressed understanding and agreed to proceed.  I discussed the assessment and treatment plan with the patient. The patient was provided an opportunity to ask questions and all were answered. The patient agreed with the plan and demonstrated an understanding of the instructions.   The patient was advised to call back or seek an in-person evaluation if the symptoms worsen or if the condition fails to improve as anticipated.  I provided 30 minutes of non-face-to-face time during this encounter.  The patient was located at home.  The provider was located at home.   Corie Chiquito, PMHNP   Subjective:   Patient ID:  Kathleen Soto is a 61 y.o. (DOB 1960/09/20) female.  Chief Complaint:  Chief Complaint  Patient presents with   Follow-up    Anxiety, depression, insomnia, and eating d/o    HPI Kathleen Soto presents for follow-up of anxiety, depression, insomnia, and binge eating. Denies depressed mood. Denies persistent irritability. She reports, "I'm easily irritated." She reports that her anxiety has been manageable. They have been traveling often with one son finishing basic training and is now in tech school. She reports that they are working on getting a king sized bed to improve sleep. Sleep has also been disrupted by son's dog staying in their bed. She reports that nights she has been unable to fall asleep due to anxious thoughts is infrequent. She reports that her appetite varies and is less overall. Denies any food restriction. Sometimes forgetting to eat. She reports that her concentration is  "not as good as I would like for it to be." She reports that her concentration is better when there are less distractions and she is home alone. Motivation has been good. Energy is good. Denies SI.    She reports that she has been doing well.   She has been seeing a marital therapist and trauma therapist. She plans to start EMDR soon.   One son and his daughter-in-law are stationed in [name]. They are planning to visit him over Thanksgiving.   She is keeping youngest son's dog for awhile.   Taking Klonopin 1/2 tab BID for anxiety. She reports that her medical provider recommended that she take it BID to help with nausea. She feels that Klonopin helps with anxiety and possibly helps with nausea. Klonopin last filled 11/26/21.  She has been helping her niece with special needs.   Past Psychiatric Medication Trials: Zoloft Paxil-sexual side effects, weight gain Luvox-ineffective Lexapro Prozac-sexual side effects Effexor Wellbutrin Latuda Abilify-effective Lamictal Strattera BuSpar Propranolol- Helped with tremor in the past. Had light-headedness at times with it. Remeron- Effective for insomnia Klonopin  Review of Systems:  Review of Systems  Gastrointestinal:  Positive for nausea.       GI polyp on colonoscopy  Musculoskeletal:  Negative for gait problem.  Psychiatric/Behavioral:         Please refer to HPI    Medications: I have reviewed the patient's current medications.  Current Outpatient Medications  Medication Sig Dispense Refill   dicyclomine (BENTYL) 10 MG capsule Take 10 mg by mouth in the morning, at noon, in the  evening, and at bedtime.     DOCUSATE CALCIUM PO Take by mouth.     Famotidine (PEPCID PO) Take by mouth daily at 12 noon.     levalbuterol (XOPENEX HFA) 45 MCG/ACT inhaler Inhale into the lungs every 4 (four) hours as needed for wheezing.     levocetirizine (XYZAL) 5 MG tablet Take 5 mg by mouth every evening.     Levothyroxine Sodium 100 MCG CAPS  Take 100 mcg by mouth daily.      loratadine (CLARITIN) 10 MG tablet Take 10 mg by mouth daily.     Omalizumab (XOLAIR [place]) Inject into the skin.     ondansetron (ZOFRAN) 4 MG tablet Take by mouth at bedtime.     pantoprazole (PROTONIX) 40 MG tablet Take 40 mg by mouth daily.     rosuvastatin (CRESTOR) 5 MG tablet Take 5 mg by mouth daily.     buPROPion (WELLBUTRIN XL) 300 MG 24 hr tablet Take 1 tablet (300 mg total) by mouth every morning. 90 tablet 1   [START ON 03/16/2022] clonazePAM (KLONOPIN) 0.5 MG tablet Take 1/2-1 tab po BID prn anxiety 60 tablet 0   lamoTRIgine (LAMICTAL) 150 MG tablet Take 1 tablet (150 mg total) by mouth daily. 90 tablet 1   montelukast (SINGULAIR) 10 MG tablet Take 10 mg by mouth at bedtime. (Patient not taking: Reported on 01/19/2022)     No current facility-administered medications for this visit.    Medication Side Effects: None  Allergies:  Allergies  Allergen Reactions   Prednisone     Esophageal spasms   Reglan [Metoclopramide]     Reports adverse reaction (cognitive side effects) to IV Reglan   Sulfonamide Derivatives     REACTION: mouth ulcers    Past Medical History:  Diagnosis Date   Depression    GERD (gastroesophageal reflux disease)    Hyperlipidemia    Osteoporosis    Thyroid disease    Urticaria, chronic     Family History  Problem Relation Age of Onset   Thyroid disease Sister    Cancer Father    Thyroid disease Father    Depression Father    Cancer Brother    Parkinson's disease Mother    Alcohol abuse Paternal Grandfather    Alcohol abuse Sister     Social History   Socioeconomic History   Marital status: Married    Spouse name: Not on file   Number of children: Not on file   Years of education: Not on file   Highest education level: Not on file  Occupational History   Not on file  Tobacco Use   Smoking status: Never   Smokeless tobacco: Never  Substance and Sexual Activity   Alcohol use: Yes    Comment: rarely    Drug use: No   Sexual activity: Not on file  Other Topics Concern   Not on file  Social History Narrative   Not on file   Social Determinants of Health   Financial Resource Strain: Not on file  Food Insecurity: Not on file  Transportation Needs: Not on file  Physical Activity: Not on file  Stress: Not on file  Social Connections: Not on file  Intimate Partner Violence: Not on file    Past Medical History, Surgical history, Social history, and Family history were reviewed and updated as appropriate.   Please see review of systems for further details on the patient's review from today.   Objective:   Physical Exam:  There were no vitals taken for this visit.  Physical Exam Neurological:     Mental Status: She is alert and oriented to person, place, and time.     Cranial Nerves: No dysarthria.  Psychiatric:        Attention and Perception: Attention and perception normal.        Mood and Affect: Mood normal.        Speech: Speech normal.        Behavior: Behavior is cooperative.        Thought Content: Thought content normal. Thought content is not paranoid or delusional. Thought content does not include homicidal or suicidal ideation. Thought content does not include homicidal or suicidal plan.        Cognition and Memory: Cognition and memory normal.        Judgment: Judgment normal.     Comments: Insight intact     Lab Review:  No results found for: "NA", "K", "CL", "CO2", "GLUCOSE", "BUN", "CREATININE", "CALCIUM", "PROT", "ALBUMIN", "AST", "ALT", "ALKPHOS", "BILITOT", "GFRNONAA", "GFRAA"  No results found for: "WBC", "RBC", "HGB", "HCT", "PLT", "MCV", "MCH", "MCHC", "RDW", "LYMPHSABS", "MONOABS", "EOSABS", "BASOSABS"  No results found for: "POCLITH", "LITHIUM"   No results found for: "PHENYTOIN", "PHENOBARB", "VALPROATE", "CBMZ"   .res Assessment: Plan:    Pt seen for 30 minutes and time spent discussing recent events and progress in individual and couples'  counseling. She reports that overall her mood and anxiety symptoms have been well controlled. She continues to have refills remaining on Klonopin. Will send script to be put on file to ensure that she will have a refill available to fill before traveling to [name] in late November. Will continue Klonopin 0.5 mg 1/2-1 tab po BID prn anxiety.  Continue Wellbutrin XL 300 mg po qd for depression.  Continue Lamictal 150 mg po qd for depression.  Recommend continuing psychotherapy.  Pt to follow-up in 3 months or sooner if clinically indicated.  Patient advised to contact office with any questions, adverse effects, or acute worsening in signs and symptoms.   Kathleen Soto was seen today for follow-up.  Diagnoses and all orders for this visit:  Recurrent major depressive disorder, in partial remission (HCC) -     buPROPion (WELLBUTRIN XL) 300 MG 24 hr tablet; Take 1 tablet (300 mg total) by mouth every morning. -     lamoTRIgine (LAMICTAL) 150 MG tablet; Take 1 tablet (150 mg total) by mouth daily.  Social anxiety disorder -     clonazePAM (KLONOPIN) 0.5 MG tablet; Take 1/2-1 tab po BID prn anxiety  Generalized anxiety disorder -     clonazePAM (KLONOPIN) 0.5 MG tablet; Take 1/2-1 tab po BID prn anxiety  Insomnia, unspecified type -     clonazePAM (KLONOPIN) 0.5 MG tablet; Take 1/2-1 tab po BID prn anxiety     Please see After Visit Summary for patient specific instructions.  No future appointments.   No orders of the defined types were placed in this encounter.     -------------------------------

## 2022-02-28 ENCOUNTER — Telehealth: Payer: Self-pay | Admitting: Psychiatry

## 2022-02-28 ENCOUNTER — Other Ambulatory Visit: Payer: Self-pay

## 2022-02-28 DIAGNOSIS — F411 Generalized anxiety disorder: Secondary | ICD-10-CM

## 2022-02-28 DIAGNOSIS — G4700 Insomnia, unspecified: Secondary | ICD-10-CM

## 2022-02-28 DIAGNOSIS — F4010 Social phobia, unspecified: Secondary | ICD-10-CM

## 2022-02-28 MED ORDER — CLONAZEPAM 0.5 MG PO TABS: ORAL_TABLET | ORAL | 2 refills | Status: DC

## 2022-02-28 NOTE — Telephone Encounter (Signed)
Pended.

## 2022-02-28 NOTE — Telephone Encounter (Signed)
Patient lvm at 9:56 requesting refill Clonazepam 0.5mg  . States she only has enough for today. Ph: [phone] [id number] [id number], [place] Please call when completed.

## 2022-03-28 DIAGNOSIS — E039 Hypothyroidism, unspecified: Secondary | ICD-10-CM | POA: Diagnosis not present

## 2022-03-28 DIAGNOSIS — E785 Hyperlipidemia, unspecified: Secondary | ICD-10-CM | POA: Diagnosis not present

## 2022-03-28 DIAGNOSIS — K219 Gastro-esophageal reflux disease without esophagitis: Secondary | ICD-10-CM | POA: Diagnosis not present

## 2022-03-28 DIAGNOSIS — F411 Generalized anxiety disorder: Secondary | ICD-10-CM | POA: Diagnosis not present

## 2022-03-28 DIAGNOSIS — F3342 Major depressive disorder, recurrent, in full remission: Secondary | ICD-10-CM | POA: Diagnosis not present

## 2022-03-28 DIAGNOSIS — E782 Mixed hyperlipidemia: Secondary | ICD-10-CM | POA: Diagnosis not present

## 2022-03-28 DIAGNOSIS — Z23 Encounter for immunization: Secondary | ICD-10-CM | POA: Diagnosis not present

## 2022-03-28 DIAGNOSIS — E559 Vitamin D deficiency, unspecified: Secondary | ICD-10-CM | POA: Diagnosis not present

## 2022-04-28 DIAGNOSIS — R5383 Other fatigue: Secondary | ICD-10-CM | POA: Diagnosis not present

## 2022-04-28 DIAGNOSIS — R6889 Other general symptoms and signs: Secondary | ICD-10-CM | POA: Diagnosis not present

## 2022-05-02 DIAGNOSIS — K582 Mixed irritable bowel syndrome: Secondary | ICD-10-CM | POA: Diagnosis not present

## 2022-06-15 DIAGNOSIS — U071 COVID-19: Secondary | ICD-10-CM | POA: Diagnosis not present

## 2022-06-15 DIAGNOSIS — Z8619 Personal history of other infectious and parasitic diseases: Secondary | ICD-10-CM | POA: Diagnosis not present

## 2022-06-15 DIAGNOSIS — R059 Cough, unspecified: Secondary | ICD-10-CM | POA: Diagnosis not present

## 2022-06-15 DIAGNOSIS — L501 Idiopathic urticaria: Secondary | ICD-10-CM | POA: Diagnosis not present

## 2022-06-29 DIAGNOSIS — K219 Gastro-esophageal reflux disease without esophagitis: Secondary | ICD-10-CM | POA: Diagnosis not present

## 2022-06-29 DIAGNOSIS — L501 Idiopathic urticaria: Secondary | ICD-10-CM | POA: Diagnosis not present

## 2022-06-29 DIAGNOSIS — F411 Generalized anxiety disorder: Secondary | ICD-10-CM | POA: Diagnosis not present

## 2022-06-29 DIAGNOSIS — F3342 Major depressive disorder, recurrent, in full remission: Secondary | ICD-10-CM | POA: Diagnosis not present

## 2022-06-29 DIAGNOSIS — E559 Vitamin D deficiency, unspecified: Secondary | ICD-10-CM | POA: Diagnosis not present

## 2022-09-02 ENCOUNTER — Telehealth: Payer: Self-pay | Admitting: Psychiatry

## 2022-09-02 DIAGNOSIS — H109 Unspecified conjunctivitis: Secondary | ICD-10-CM | POA: Diagnosis not present

## 2022-09-02 DIAGNOSIS — F4010 Social phobia, unspecified: Secondary | ICD-10-CM

## 2022-09-02 DIAGNOSIS — F411 Generalized anxiety disorder: Secondary | ICD-10-CM

## 2022-09-02 DIAGNOSIS — F3341 Major depressive disorder, recurrent, in partial remission: Secondary | ICD-10-CM

## 2022-09-02 DIAGNOSIS — G4700 Insomnia, unspecified: Secondary | ICD-10-CM

## 2022-09-02 MED ORDER — LAMOTRIGINE 150 MG PO TABS: 150.0000 mg | ORAL_TABLET | Freq: Every day | ORAL | 0 refills | Status: DC

## 2022-09-02 MED ORDER — CLONAZEPAM 0.5 MG PO TABS: ORAL_TABLET | ORAL | 0 refills | Status: DC

## 2022-09-02 NOTE — Telephone Encounter (Addendum)
Pt called to schedule follow up and request refills. RF for Clonazepam and Lamotrigine  at [facility] [address] [place]

## 2022-09-02 NOTE — Telephone Encounter (Signed)
Klonopin last filled 07/04/22. Script now expired (expired 08/30/22). Please let pt know Klonopin and Lamotrigine has been sent to her pharmacy.

## 2022-09-08 ENCOUNTER — Telehealth (INDEPENDENT_AMBULATORY_CARE_PROVIDER_SITE_OTHER): Payer: BC Managed Care – PPO | Admitting: Psychiatry

## 2022-09-08 ENCOUNTER — Encounter: Payer: Self-pay | Admitting: Psychiatry

## 2022-09-08 DIAGNOSIS — F411 Generalized anxiety disorder: Secondary | ICD-10-CM

## 2022-09-08 DIAGNOSIS — F4010 Social phobia, unspecified: Secondary | ICD-10-CM | POA: Diagnosis not present

## 2022-09-08 DIAGNOSIS — F3341 Major depressive disorder, recurrent, in partial remission: Secondary | ICD-10-CM | POA: Diagnosis not present

## 2022-09-08 DIAGNOSIS — F901 Attention-deficit hyperactivity disorder, predominantly hyperactive type: Secondary | ICD-10-CM

## 2022-09-08 DIAGNOSIS — G4700 Insomnia, unspecified: Secondary | ICD-10-CM

## 2022-09-08 MED ORDER — BUPROPION HCL ER (XL) 300 MG PO TB24
300.0000 mg | ORAL_TABLET | Freq: Every morning | ORAL | 1 refills | Status: DC
Start: 2022-09-08 — End: 2023-03-20

## 2022-09-08 MED ORDER — CLONAZEPAM 0.5 MG PO TABS
ORAL_TABLET | ORAL | 5 refills | Status: DC
Start: 2022-09-30 — End: 2023-03-20

## 2022-09-08 MED ORDER — LAMOTRIGINE 150 MG PO TABS
150.0000 mg | ORAL_TABLET | Freq: Every day | ORAL | 1 refills | Status: DC
Start: 2022-09-08 — End: 2023-03-20

## 2022-09-08 MED ORDER — ATOMOXETINE HCL 80 MG PO CAPS
80.0000 mg | ORAL_CAPSULE | Freq: Every day | ORAL | 1 refills | Status: DC
Start: 2022-09-08 — End: 2023-03-20

## 2022-09-08 MED ORDER — ATOMOXETINE HCL 40 MG PO CAPS
ORAL_CAPSULE | ORAL | 0 refills | Status: DC
Start: 2022-09-08 — End: 2023-02-23

## 2022-09-08 MED ORDER — ATOMOXETINE HCL 40 MG PO CAPS
ORAL_CAPSULE | ORAL | 0 refills | Status: DC
Start: 2022-09-08 — End: 2022-09-08
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
## 2022-09-13 ENCOUNTER — Telehealth: Payer: Self-pay | Admitting: Psychiatry

## 2022-09-13 NOTE — Telephone Encounter (Signed)
Called pharmacy and answered their questions.

## 2022-09-13 NOTE — Telephone Encounter (Signed)
Pharmacy lvm at 11:29 regarding patient's Strattera prescripiton. States that they received prescription 4/25 and need some clarification on dosage. Ph: [phone]

## 2022-09-28 DIAGNOSIS — E785 Hyperlipidemia, unspecified: Secondary | ICD-10-CM | POA: Diagnosis not present

## 2022-09-28 DIAGNOSIS — E559 Vitamin D deficiency, unspecified: Secondary | ICD-10-CM | POA: Diagnosis not present

## 2022-09-28 DIAGNOSIS — Z0000 Encounter for general adult medical examination without abnormal findings: Secondary | ICD-10-CM | POA: Diagnosis not present

## 2022-09-28 DIAGNOSIS — F3342 Major depressive disorder, recurrent, in full remission: Secondary | ICD-10-CM | POA: Diagnosis not present

## 2022-09-28 DIAGNOSIS — E039 Hypothyroidism, unspecified: Secondary | ICD-10-CM | POA: Diagnosis not present

## 2022-09-28 DIAGNOSIS — R7309 Other abnormal glucose: Secondary | ICD-10-CM | POA: Diagnosis not present

## 2022-12-08 DIAGNOSIS — Z90710 Acquired absence of both cervix and uterus: Secondary | ICD-10-CM | POA: Diagnosis not present

## 2022-12-08 DIAGNOSIS — Z01419 Encounter for gynecological examination (general) (routine) without abnormal findings: Secondary | ICD-10-CM | POA: Diagnosis not present

## 2022-12-08 DIAGNOSIS — Z1331 Encounter for screening for depression: Secondary | ICD-10-CM | POA: Diagnosis not present

## 2022-12-08 DIAGNOSIS — Z1272 Encounter for screening for malignant neoplasm of vagina: Secondary | ICD-10-CM | POA: Diagnosis not present

## 2022-12-16 LAB — HM PAP SMEAR

## 2023-01-18 DIAGNOSIS — E063 Autoimmune thyroiditis: Secondary | ICD-10-CM | POA: Diagnosis not present

## 2023-01-23 DIAGNOSIS — R92333 Mammographic heterogeneous density, bilateral breasts: Secondary | ICD-10-CM | POA: Diagnosis not present

## 2023-01-23 DIAGNOSIS — Z1231 Encounter for screening mammogram for malignant neoplasm of breast: Secondary | ICD-10-CM | POA: Diagnosis not present

## 2023-01-23 LAB — HM MAMMOGRAPHY

## 2023-02-23 ENCOUNTER — Encounter: Payer: Self-pay | Admitting: Family Medicine

## 2023-02-23 ENCOUNTER — Ambulatory Visit: Payer: BC Managed Care – PPO | Admitting: Family Medicine

## 2023-02-23 VITALS — BP 135/85 | HR 94 | Temp 96.8°F | Ht 61.0 in | Wt 145.4 lb

## 2023-02-23 DIAGNOSIS — Z79899 Other long term (current) drug therapy: Secondary | ICD-10-CM | POA: Diagnosis not present

## 2023-02-23 DIAGNOSIS — E079 Disorder of thyroid, unspecified: Secondary | ICD-10-CM | POA: Diagnosis not present

## 2023-02-23 DIAGNOSIS — K582 Mixed irritable bowel syndrome: Secondary | ICD-10-CM

## 2023-02-23 DIAGNOSIS — E782 Mixed hyperlipidemia: Secondary | ICD-10-CM | POA: Diagnosis not present

## 2023-02-23 DIAGNOSIS — M899 Disorder of bone, unspecified: Secondary | ICD-10-CM | POA: Diagnosis not present

## 2023-02-23 DIAGNOSIS — F411 Generalized anxiety disorder: Secondary | ICD-10-CM

## 2023-02-23 DIAGNOSIS — R41840 Attention and concentration deficit: Secondary | ICD-10-CM | POA: Diagnosis not present

## 2023-02-23 DIAGNOSIS — E559 Vitamin D deficiency, unspecified: Secondary | ICD-10-CM

## 2023-02-23 DIAGNOSIS — L501 Idiopathic urticaria: Secondary | ICD-10-CM | POA: Diagnosis not present

## 2023-02-23 DIAGNOSIS — F988 Other specified behavioral and emotional disorders with onset usually occurring in childhood and adolescence: Secondary | ICD-10-CM | POA: Insufficient documentation

## 2023-02-23 DIAGNOSIS — K219 Gastro-esophageal reflux disease without esophagitis: Secondary | ICD-10-CM

## 2023-02-23 DIAGNOSIS — D179 Benign lipomatous neoplasm, unspecified: Secondary | ICD-10-CM

## 2023-02-23 DIAGNOSIS — F3342 Major depressive disorder, recurrent, in full remission: Secondary | ICD-10-CM

## 2023-02-23 DIAGNOSIS — M949 Disorder of cartilage, unspecified: Secondary | ICD-10-CM

## 2023-02-23 DIAGNOSIS — K589 Irritable bowel syndrome without diarrhea: Secondary | ICD-10-CM | POA: Insufficient documentation

## 2023-02-23 DIAGNOSIS — M8580 Other specified disorders of bone density and structure, unspecified site: Secondary | ICD-10-CM | POA: Insufficient documentation

## 2023-02-23 NOTE — Progress Notes (Signed)
Subjective:  Patient ID: Kathleen Soto, female    DOB: 06-Jul-1960, 62 y.o.   MRN: [id number]  Patient Care Team: Sonny Masters, FNP as PCP - General (Family Medicine)   Chief Complaint:  New Patient (Initial Visit) ([facility]) and Establish Care   HPI: Kathleen Soto is a 62 y.o. female presenting on 02/23/2023 for New Patient (Initial Visit) ([facility]) and Establish Care  Discussed the use of AI scribe software for clinical note transcription with the patient, who gave verbal consent to proceed.  History of Present Illness   The patient, previously diagnosed with Attention Deficit Disorder (ADD), thyroid disease, asthma, idiopathic urticaria, osteopenia, and Irritable Bowel Syndrome (IBS), presents for a routine follow-up. She reports good control of ADD symptoms with Strattera, although she has experienced vivid dreams since starting the medication. Her thyroid disease, managed with levothyroxine, has been stable for several years. The patient denies any significant asthma symptoms, only experiencing exertional symptoms, and has not used her inhaler for an extended period. Her idiopathic urticaria is managed with Singulair, antihistamines, Xolair, and Pepcid, with occasional flare-ups noted when nearing the time for her Xolair dose.  The patient has a history of osteopenia, now suspected to have progressed to osteoporosis, with her last DEXA scan date uncertain. Her IBS symptoms, characterized by alternating diarrhea and constipation, are managed with Bentyl. She also reports a history of an anal fissure repair, two C-sections, and an abdominal hysterectomy due to adenomyosis.  The patient has been on Lamictal since around 2005 for an unspecified condition, with no recent level checks. She also takes Wellbutrin as an adjunct therapy with the Lamictal. She reports a history of hyperlipidemia,  managed with Zetia due to intolerance to statins.  The patient has a lipoma on her chest that has recently doubled in size and is causing discomfort due to its location under the bra line. She also reports a history of a swollen right hand, initially suspected to be psoriatic arthritis, but the symptoms have not recurred recently.  The patient has been diagnosed with vitamin D deficiency in the past, with recent levels unknown. She also reports occasional voice changes in the afternoons, which have been ongoing for a while. The patient has a history of fibrocystic breasts and has had a lump removed in the past. She also reports a history of chronic hives, currently managed with a combination of medications including Xolair.  The patient has a family history of prostate cancer, chronic lymphocytic leukemia, and rheumatoid arthritis. She has tested positive for thyroid antibodies in the past, suggesting a possible autoimmune component to her idiopathic urticaria. She also reports a family history of breast and colon cancer.  The patient has been on clonazepam since around 2021, taking a half dose twice daily. She also reports a history of dryness and pain in the vaginal area, managed with estradiol as needed. She has a history of an anal fissure repair and a breast lump removal, which was diagnosed as fibrocystic. She also reports a history of skin tags and dry skin.         02/23/2023    2:07 PM  Depression screen PHQ 2/9  Decreased Interest 0  Down, Depressed, Hopeless 0  PHQ - 2 Score 0  Altered sleeping 0  Tired, decreased energy 0  Change in appetite 3  Feeling bad or failure about yourself  0  Trouble concentrating  2  Moving slowly or fidgety/restless 0  Suicidal thoughts 0  PHQ-9 Score 5  Difficult doing work/chores Somewhat difficult      02/23/2023    2:07 PM  GAD 7 : Generalized Anxiety Score  Nervous, Anxious, on Edge 1  Control/stop worrying 1  Worry too much - different  things 2  Trouble relaxing 0  Restless 0  Easily annoyed or irritable 1  Afraid - awful might happen 0  Total GAD 7 Score 5  Anxiety Difficulty Not difficult at all      Relevant past medical, surgical, family, and social history reviewed and updated as indicated.  Allergies and medications reviewed and updated. Data reviewed: Chart in Epic.   Past Medical History:  Diagnosis Date   Depression    GERD (gastroesophageal reflux disease)    Hyperlipidemia    Osteoporosis    Thyroid disease    Urticaria, chronic     Past Surgical History:  Procedure Laterality Date   ABDOMINAL HYSTERECTOMY     ANAL FISSURE REPAIR     BREAST BIOPSY     CESAREAN SECTION     EXPLORATORY LAPAROTOMY     TONSILLECTOMY      Social History   Socioeconomic History   Marital status: Married    Spouse name: Not on file   Number of children: 2   Years of education: Not on file   Highest education level: Not on file  Occupational History   Not on file  Tobacco Use   Smoking status: Never   Smokeless tobacco: Never  Vaping Use   Vaping status: Never Used  Substance and Sexual Activity   Alcohol use: Yes    Comment: rarely   Drug use: No   Sexual activity: Not on file  Other Topics Concern   Not on file  Social History Narrative   Not on file   Social Determinants of Health   Financial Resource Strain: Low Risk  (09/28/2022)   Received from [facility]   Overall Financial Resource Strain (CARDIA)    Difficulty of Paying Living Expenses: Not hard at all  Food Insecurity: No Food Insecurity (09/28/2022)   Received from [facility]   Hunger Vital Sign    Worried About Running Out of Food in the Last Year: Never true    Ran Out of Food in the Last Year: Never true  Transportation Needs: No Transportation Needs (09/28/2022)   Received from [facility] - Transportation    Lack of Transportation (Medical): No    Lack of Transportation (Non-Medical): No  Physical  Activity: Insufficiently Active (09/28/2022)   Received from [facility]   Exercise Vital Sign    Days of Exercise per Week: 2 days    Minutes of Exercise per Session: 20 min  Stress: No Stress Concern Present (09/28/2022)   Received from [facility] - Occupational Stress Questionnaire    Feeling of Stress : Only a little  Social Connections: Socially Integrated (09/28/2022)   Received from [facility]   Social Network    How would you rate your social network (family, work, friends)?: Good participation with social networks  Intimate Partner Violence: Not At Risk (09/28/2022)   Received from [facility]   HITS    Over the last 12 months how often did your partner physically hurt you?: 1    Over the last 12 months how often did your partner insult you or talk  down to you?: 3    Over the last 12 months how often did your partner threaten you with physical harm?: 1    Over the last 12 months how often did your partner scream or curse at you?: 3    Outpatient Encounter Medications as of 02/23/2023  Medication Sig   atomoxetine (STRATTERA) 80 MG capsule Take 1 capsule (80 mg total) by mouth daily.   buPROPion (WELLBUTRIN XL) 300 MG 24 hr tablet Take 1 tablet (300 mg total) by mouth every morning.   clonazePAM (KLONOPIN) 0.5 MG tablet Take 1/2-1 tab po BID prn anxiety   dicyclomine (BENTYL) 10 MG capsule Take 10 mg by mouth as needed for spasms.   dimenhyDRINATE (DRAMAMINE) 50 MG tablet Take 50 mg by mouth every 8 (eight) hours as needed.   docusate sodium (COLACE) 50 MG capsule Take 50 mg by mouth 2 (two) times daily.   ezetimibe (ZETIA) 10 MG tablet Take 10 mg by mouth daily.   Famotidine (PEPCID PO) Take by mouth daily at 12 noon.   levalbuterol (XOPENEX HFA) 45 MCG/ACT inhaler Inhale into the lungs every 4 (four) hours as needed for wheezing.   levocetirizine (XYZAL) 5 MG tablet Take 5 mg by mouth every evening.   Levothyroxine Sodium  100 MCG CAPS Take 100 mcg by mouth daily.    loratadine (CLARITIN) 10 MG tablet Take 10 mg by mouth daily.   montelukast (SINGULAIR) 10 MG tablet Take 10 mg by mouth at bedtime.   Omalizumab (XOLAIR [place]) Inject into the skin.   ondansetron (ZOFRAN) 4 MG tablet Take by mouth at bedtime.   pantoprazole (PROTONIX) 40 MG tablet Take 40 mg by mouth daily.   lamoTRIgine (LAMICTAL) 150 MG tablet Take 1 tablet (150 mg total) by mouth daily.   [DISCONTINUED] atomoxetine (STRATTERA) 40 MG capsule Take 1 capsule (40 mg) in the morning for one week, then increase to two capsules (80 mg) in the morning   [DISCONTINUED] rosuvastatin (CRESTOR) 5 MG tablet Take 5 mg by mouth daily. (Patient not taking: Reported on 09/08/2022)   No facility-administered encounter medications on file as of 02/23/2023.    Allergies  Allergen Reactions   Prednisone     Esophageal spasms   Sulfonamide Derivatives     REACTION: mouth ulcers   Metoclopramide Other (See Comments)    Reports adverse reaction (cognitive side effects) to IV Reglan  Pt states she gets severe confusion   Review of Systems  Constitutional:  Negative for chills, diaphoresis, fever, malaise/fatigue and weight loss.  Respiratory:  Negative for cough, hemoptysis, sputum production, shortness of breath and wheezing.   Cardiovascular:  Negative for chest pain, palpitations, orthopnea, claudication, leg swelling and PND.  Gastrointestinal:  Positive for abdominal pain, constipation and diarrhea. Negative for blood in stool, heartburn, melena, nausea and vomiting.  Genitourinary:        Vaginal dryness  Skin:  Negative for itching and rash.  Neurological:  Negative for dizziness, tingling, tremors, sensory change, speech change, focal weakness, seizures, loss of consciousness, weakness and headaches.  Psychiatric/Behavioral:  Positive for depression. Negative for hallucinations, memory loss, substance abuse and suicidal ideas. The patient is  nervous/anxious. The patient does not have insomnia.   All other systems reviewed and are negative.        Objective:  BP 135/85   Pulse 94   Temp (!) 96.8 F (36 C) (Temporal)   Ht 5\' 1"  (1.549 m)   Wt 145 lb 6.4 oz (66 kg)  SpO2 97%   BMI 27.47 kg/m    Wt Readings from Last 3 Encounters:  02/23/23 145 lb 6.4 oz (66 kg)  04/10/16 149 lb (67.6 kg)  06/13/15 152 lb 4 oz (69.1 kg)    Physical Exam Vitals and nursing note reviewed.  Constitutional:      General: She is not in acute distress.    Appearance: Normal appearance. She is well-developed and well-groomed. She is not ill-appearing, toxic-appearing or diaphoretic.  HENT:     Head: Normocephalic and atraumatic.     Jaw: There is normal jaw occlusion.     Right Ear: Hearing normal.     Left Ear: Hearing normal.     Nose: Nose normal.     Mouth/Throat:     Lips: Pink.     Mouth: Mucous membranes are moist.     Pharynx: Uvula midline.  Eyes:     General: Lids are normal.     Conjunctiva/sclera: Conjunctivae normal.     Pupils: Pupils are equal, round, and reactive to light.  Neck:     Trachea: Trachea and phonation normal.  Cardiovascular:     Rate and Rhythm: Normal rate and regular rhythm.     Chest Wall: PMI is not displaced.     Pulses: Normal pulses.     Heart sounds: Normal heart sounds. No murmur heard.    No friction rub. No gallop.  Pulmonary:     Effort: Pulmonary effort is normal. No respiratory distress.     Breath sounds: Normal breath sounds. No wheezing.  Abdominal:     General: Bowel sounds are normal.     Palpations: Abdomen is soft.  Musculoskeletal:     Cervical back: Normal range of motion and neck supple.     Right lower leg: No edema.     Left lower leg: No edema.  Skin:    General: Skin is warm and dry.     Capillary Refill: Capillary refill takes less than 2 seconds.     Coloration: Skin is not cyanotic, jaundiced or pale.     Findings: No rash.       Neurological:      General: No focal deficit present.     Mental Status: She is alert and oriented to person, place, and time.     Sensory: Sensation is intact.     Motor: Motor function is intact.     Coordination: Coordination is intact.     Gait: Gait is intact.     Deep Tendon Reflexes: Reflexes are normal and symmetric.  Psychiatric:        Attention and Perception: Attention and perception normal.        Mood and Affect: Mood and affect normal.        Speech: Speech normal.        Behavior: Behavior normal. Behavior is cooperative.        Thought Content: Thought content normal.        Cognition and Memory: Cognition and memory normal.        Judgment: Judgment normal.     Results for orders placed or performed in visit on 04/10/19  Novel Coronavirus, NAA ([facility])   Specimen: Nasopharyngeal(NP) swabs in vial transport medium   NASOPHARYNGE  TESTING  Result Value Ref Range   SARS-CoV-2, NAA Not Detected Not Detected       Pertinent labs & imaging results that were available during my care of the patient were reviewed by me and  considered in my medical decision making.  Assessment & Plan:  Kathleen "[name]" was seen today for new patient (initial visit) and establish care.  Assessment and Plan    Attention Deficit Disorder Stable on Strattera with vivid dreams as a side effect. -Continue Strattera.  Hypothyroidism Stable on Levothyroxine. Thyroid gland is non-functional due to radioactive iodine treatment for hyperthyroidism in the past. -Continue Levothyroxine. -Annual follow-up with Dr. Morrison Old.  Anxiety/Depression  Stable on Lamictal and Wellbutrin. Lamictal levels have not been checked previously. -Check Lamictal levels today. -Continue Lamictal and Wellbutrin.  Chronic Idiopathic Urticaria Managed with Singulair, Xolair, and Pepcid. -Continue current medications.  Hyperlipidemia On Zetia. Previous intolerance to Lipitor due to muscle pain. -Continue  Zetia.  Osteopenia Likely progressed to osteoporosis. Uncertain when last DEXA scan was performed. -Check when last DEXA scan was performed and schedule next one if due.  Lipoma Increased in size and causing discomfort due to bra rubbing against it. -Consult with dermatologist or Dr. Dwain Sarna for possible removal.  Vitamin D Deficiency Previously overcorrected with prescription Vitamin D, now on over-the-counter supplementation. -Check Vitamin D levels today.  Gastroesophageal Reflux Disease Controlled with Protonix. -Continue Protonix.  Irritable Bowel Syndrome Managed with Bentyl. -Continue Bentyl.  General Health Maintenance / Followup Plans -Continue annual mammograms. -Check ANA in the future due to family history of rheumatoid arthritis and personal history of autoimmune thyroid disease. -Consider using CeraVe Rough and Bumpy for skin dryness. -Follow-up at the beginning of the year for a physical exam. -Check renal and liver function today due to Lamictal use. -Continue current medications including Novot, Clonazepam, and Estradiol as needed.      EHR reviewed in detail including prior diagnostic studies.   Diagnoses and all orders for this visit:  Thyroid disease  Attention or concentration deficit  Idiopathic urticaria  Generalized anxiety disorder  Major depressive disorder, recurrent episode, in full remission (HCC)  Gastroesophageal reflux disease without esophagitis -     CBC with Differential/Platelet  OSTEOPENIA -     CMP14+EGFR -     VITAMIN D 25 Hydroxy (Vit-D Deficiency, Fractures)  Mixed hyperlipidemia -     Lipid panel  High risk medication use -     Lamotrigine level  Vitamin D deficiency -     CMP14+EGFR -     VITAMIN D 25 Hydroxy (Vit-D Deficiency, Fractures)  Irritable bowel syndrome with both constipation and diarrhea     Continue all other maintenance medications.  Follow up plan: Return in about 6 months (around  08/24/2023), or if symptoms worsen or fail to improve, for CPE.   Continue healthy lifestyle choices, including diet (rich in fruits, vegetables, and lean proteins, and low in salt and simple carbohydrates) and exercise (at least 30 minutes of moderate physical activity daily).  Educational handout given for health maintenance  The above assessment and management plan was discussed with the patient. The patient verbalized understanding of and has agreed to the management plan. Patient is aware to call the clinic if they develop any new symptoms or if symptoms persist or worsen. Patient is aware when to return to the clinic for a follow-up visit. Patient educated on when it is appropriate to go to the emergency department.   Kari Baars, FNP-C [facility] [phone]

## 2023-02-24 LAB — CMP14+EGFR
ALT: 30 [IU]/L (ref 0–32)
AST: 22 [IU]/L (ref 0–40)
Albumin: 4.4 g/dL (ref 3.8–4.9)
Alkaline Phosphatase: 83 [IU]/L (ref 44–121)
BUN/Creatinine Ratio: 20 (ref 12–28)
BUN: 16 mg/dL (ref 8–27)
Bilirubin Total: <0.2 mg/dL (ref 0.0–1.2)
CO2: 25 mmol/L (ref 20–29)
Calcium: 9.4 mg/dL (ref 8.7–10.3)
Chloride: 103 mmol/L (ref 96–106)
Creatinine, Ser: 0.8 mg/dL (ref 0.57–1.00)
Globulin, Total: 1.9 g/dL (ref 1.5–4.5)
Glucose: 86 mg/dL (ref 70–99)
Potassium: 3.9 mmol/L (ref 3.5–5.2)
Sodium: 143 mmol/L (ref 134–144)
Total Protein: 6.3 g/dL (ref 6.0–8.5)
eGFR: 84 mL/min/{1.73_m2} (ref >59)

## 2023-02-24 LAB — CBC WITH DIFFERENTIAL/PLATELET
Basophils Absolute: 0.1 10*3/uL (ref 0.0–0.2)
Basos: 1 %
EOS (ABSOLUTE): 0.1 10*3/uL (ref 0.0–0.4)
Eos: 3 %
Hematocrit: 43.7 % (ref 34.0–46.6)
Hemoglobin: 14.2 g/dL (ref 11.1–15.9)
Immature Grans (Abs): 0 10*3/uL (ref 0.0–0.1)
Immature Granulocytes: 0 %
Lymphocytes Absolute: 1.5 10*3/uL (ref 0.7–3.1)
Lymphs: 30 %
MCH: 29.5 pg (ref 26.6–33.0)
MCHC: 32.5 g/dL (ref 31.5–35.7)
MCV: 91 fL (ref 79–97)
Monocytes Absolute: 0.5 10*3/uL (ref 0.1–0.9)
Monocytes: 10 %
Neutrophils Absolute: 2.7 10*3/uL (ref 1.4–7.0)
Neutrophils: 56 %
Platelets: 236 10*3/uL (ref 150–450)
RBC: 4.81 x10E6/uL (ref 3.77–5.28)
RDW: 12.5 % (ref 11.7–15.4)
WBC: 4.8 10*3/uL (ref 3.4–10.8)

## 2023-02-24 LAB — LIPID PANEL
Chol/HDL Ratio: 3.9 {ratio} (ref 0.0–4.4)
Cholesterol, Total: 208 mg/dL — ABNORMAL HIGH (ref 100–199)
HDL: 54 mg/dL (ref >39)
LDL Chol Calc (NIH): 110 mg/dL — ABNORMAL HIGH (ref 0–99)
Triglycerides: 259 mg/dL — ABNORMAL HIGH (ref 0–149)
VLDL Cholesterol Cal: 44 mg/dL — ABNORMAL HIGH (ref 5–40)

## 2023-02-24 LAB — LAMOTRIGINE LEVEL: Lamotrigine Lvl: 2.7 ug/mL (ref 2.0–20.0)

## 2023-02-24 LAB — VITAMIN D 25 HYDROXY (VIT D DEFICIENCY, FRACTURES): Vit D, 25-Hydroxy: 34.9 ng/mL (ref 30.0–100.0)

## 2023-03-01 ENCOUNTER — Other Ambulatory Visit: Payer: Self-pay | Admitting: Family Medicine

## 2023-03-14 ENCOUNTER — Encounter: Payer: Self-pay | Admitting: Family Medicine

## 2023-03-20 ENCOUNTER — Telehealth (INDEPENDENT_AMBULATORY_CARE_PROVIDER_SITE_OTHER): Payer: BC Managed Care – PPO | Admitting: Psychiatry

## 2023-03-20 ENCOUNTER — Encounter: Payer: Self-pay | Admitting: Psychiatry

## 2023-03-20 DIAGNOSIS — F4010 Social phobia, unspecified: Secondary | ICD-10-CM | POA: Diagnosis not present

## 2023-03-20 DIAGNOSIS — F901 Attention-deficit hyperactivity disorder, predominantly hyperactive type: Secondary | ICD-10-CM | POA: Diagnosis not present

## 2023-03-20 DIAGNOSIS — F411 Generalized anxiety disorder: Secondary | ICD-10-CM

## 2023-03-20 DIAGNOSIS — F3341 Major depressive disorder, recurrent, in partial remission: Secondary | ICD-10-CM | POA: Diagnosis not present

## 2023-03-20 DIAGNOSIS — G4700 Insomnia, unspecified: Secondary | ICD-10-CM

## 2023-03-20 MED ORDER — CLONAZEPAM 0.5 MG PO TABS: ORAL_TABLET | ORAL | 5 refills | Status: DC

## 2023-03-20 MED ORDER — LAMOTRIGINE 150 MG PO TABS: 150.0000 mg | ORAL_TABLET | Freq: Every day | ORAL | 2 refills | Status: AC

## 2023-03-20 MED ORDER — ATOMOXETINE HCL 80 MG PO CAPS: 80.0000 mg | ORAL_CAPSULE | Freq: Every day | ORAL | 2 refills | Status: AC

## 2023-03-20 MED ORDER — BUPROPION HCL ER (XL) 300 MG PO TB24: 300.0000 mg | ORAL_TABLET | Freq: Every morning | ORAL | 2 refills | Status: AC

## 2023-03-20 NOTE — Progress Notes (Signed)
Kathleen Soto [id number] [birth year] 62 y.o.  Virtual Visit via Video Note  I connected with pt @ on 03/20/23 at  2:30 PM EST by a video enabled telemedicine application and verified that I am speaking with the correct person using two identifiers.   I discussed the limitations of evaluation and management by telemedicine and the availability of in person appointments. The patient expressed understanding and agreed to proceed.  I discussed the assessment and treatment plan with the patient. The patient was provided an opportunity to ask questions and all were answered. The patient agreed with the plan and demonstrated an understanding of the instructions.   The patient was advised to call back or seek an in-person evaluation if the symptoms worsen or if the condition fails to improve as anticipated.  I provided 35 minutes of non-face-to-face time during this encounter.  The patient was located at home.  The provider was located at [facility].   Kathleen Soto, PMHNP   Subjective:   Patient ID:  Kathleen Soto is a 62 y.o. (DOB [birth year]) female.  Chief Complaint:  Chief Complaint  Patient presents with   Follow-up    Anxiety, depression, insomnia, and binge eating    HPI Kathleen Soto presents for follow-up of anxiety, depression, binge eating, and insomnia. She reports that she has been "really well." She denies any significant anxiety. She denies depressed mood. Energy has been ok. Motivation has been ok. She reports that her sleep is "decent." She estimates sleeping 5-8 hours a night.   She reports that she will occasionally feel overwhelmed and will procrastinate. She reports that she gets distracted when she starts to look at her phone. She is considering getting up and exercise at the start of her day. She reports that otherwise she feels that Strattera is helpful for concentration. She has been doing some free lance writing. She will sometimes go to [employer] to work to minimize distractions. She reports that she has the confidence that she can do this. She reports, "I'm not eating well." She reports that she is eating more carbs and "junk." Denies SI.   She reports improved marital relationship. Husband lost 80 lbs intentionally after PCP recommended he lose weight for his health. She reports that some of husband's focus on weight is triggering at times.   She has been working with individual therapist and doing some EMDR.   She is writing about equine assisted therapies. She is planning on volunteering at [employer].   She has been helping some with care-giving for in-laws. In-laws now have an aide that has been very helpful.   She reports that she has been taking Klonopin 0.5 mg 1/2 tab twice daily.   Klonopin last filled 03/01/23.   Past Psychiatric Medication Trials: Zoloft Paxil-sexual side effects, weight gain Luvox-ineffective Lexapro Prozac-sexual side effects Effexor Wellbutrin Latuda Abilify-effective Lamictal Strattera BuSpar Propranolol- Helped with tremor in the past. Had light-headedness at times with it. Remeron- Effective for insomnia Klonopin  Review of Systems:  Review of Systems  Gastrointestinal:        Reflux  Musculoskeletal:  Negative for gait problem.  Skin:        She reports 2-3 episodes of hives in the last few months, typically right before shot is due.   Psychiatric/Behavioral:         Please refer to HPI    Medications: I have reviewed the patient's current medications.  Current Outpatient Medications  Medication Sig Dispense  Refill   atomoxetine (STRATTERA) 80 MG capsule Take 1 capsule (80 mg total) by mouth daily. 90 capsule 2   buPROPion (WELLBUTRIN XL) 300 MG 24 hr tablet Take 1 tablet (300 mg total) by mouth every morning. 90 tablet 2   [START ON 03/29/2023] clonazePAM (KLONOPIN) 0.5 MG tablet Take 1/2-1 tab po BID prn anxiety 60 tablet 5   dicyclomine (BENTYL) 10 MG capsule  Take 10 mg by mouth as needed for spasms.     dimenhyDRINATE (DRAMAMINE) 50 MG tablet Take 50 mg by mouth every 8 (eight) hours as needed.     docusate sodium (COLACE) 50 MG capsule Take 50 mg by mouth 2 (two) times daily.     ezetimibe (ZETIA) 10 MG tablet TAKE 1 TABLET(10 MG) BY MOUTH DAILY 90 tablet 2   Famotidine (PEPCID PO) Take by mouth daily at 12 noon.     Lactobacillus (REPHRESH PRO-B) CAPS Take by mouth.     levocetirizine (XYZAL) 5 MG tablet Take 5 mg by mouth every evening.     Levothyroxine Sodium 100 MCG CAPS Take 100 mcg by mouth daily.      loratadine (CLARITIN) 10 MG tablet Take 10 mg by mouth daily.     montelukast (SINGULAIR) 10 MG tablet Take 10 mg by mouth at bedtime.     Omalizumab (XOLAIR Dolliver) Inject into the skin.     ondansetron (ZOFRAN) 4 MG tablet Take by mouth at bedtime.     pantoprazole (PROTONIX) 40 MG tablet Take 40 mg by mouth daily.     lamoTRIgine (LAMICTAL) 150 MG tablet Take 1 tablet (150 mg total) by mouth daily. 90 tablet 2   levalbuterol (XOPENEX HFA) 45 MCG/ACT inhaler Inhale into the lungs every 4 (four) hours as needed for wheezing. (Patient not taking: Reported on 03/20/2023)     No current facility-administered medications for this visit.    Medication Side Effects: None  Allergies:  Allergies  Allergen Reactions   Prednisone     Esophageal spasms   Sulfonamide Derivatives     REACTION: mouth ulcers   Metoclopramide Other (See Comments)    Reports adverse reaction (cognitive side effects) to IV Reglan  Pt states she gets severe confusion    Past Medical History:  Diagnosis Date   Depression    GERD (gastroesophageal reflux disease)    Hyperlipidemia    Osteoporosis    Osteoporosis    Thyroid disease    Urticaria, chronic     Family History  Problem Relation Age of Onset   Parkinson's disease Mother    Thyroid disease Father    Depression Father    Prostate cancer Father    Leukemia Father    Thyroid disease Sister     Alcohol abuse Sister    Cancer Brother    Alcohol abuse Paternal Grandfather    Rheum arthritis Paternal Grandfather     Social History   Socioeconomic History   Marital status: Married    Spouse name: Not on file   Number of children: 2   Years of education: Not on file   Highest education level: Not on file  Occupational History   Not on file  Tobacco Use   Smoking status: Never   Smokeless tobacco: Never  Vaping Use   Vaping status: Never Used  Substance and Sexual Activity   Alcohol use: Yes    Comment: rarely   Drug use: No   Sexual activity: Not on file  Other Topics Concern  Not on file  Social History Narrative   Not on file   Social Determinants of Health   Financial Resource Strain: Low Risk  (09/28/2022)   Received from [facility]   Overall Financial Resource Strain (CARDIA)    Difficulty of Paying Living Expenses: Not hard at all  Food Insecurity: No Food Insecurity (09/28/2022)   Received from [facility]   Hunger Vital Sign    Worried About Running Out of Food in the Last Year: Never true    Ran Out of Food in the Last Year: Never true  Transportation Needs: No Transportation Needs (09/28/2022)   Received from [facility] - Transportation    Lack of Transportation (Medical): No    Lack of Transportation (Non-Medical): No  Physical Activity: Insufficiently Active (09/28/2022)   [facility]   Exercise Vital Sign    Days of Exercise per Week: 2 days    Minutes of Exercise per Session: 20 min  Stress: No Stress Concern Present (09/28/2022)   [facility] - Occupational Stress Questionnaire    Feeling of Stress : Only a little  Social Connections: Socially Integrated (09/28/2022)   Received from [facility]   Social Network    How would you rate your social network (family, work, friends)?: Good participation with social networks  Intimate Partner Violence: Not  At Risk (09/28/2022)   Received from [facility]   HITS    Over the last 12 months how often did your partner physically hurt you?: 1    Over the last 12 months how often did your partner insult you or talk down to you?: 3    Over the last 12 months how often did your partner threaten you with physical harm?: 1    Over the last 12 months how often did your partner scream or curse at you?: 3    Past Medical History, Surgical history, Social history, and Family history were reviewed and updated as appropriate.   Please see review of systems for further details on the patient's review from today.   Objective:   Physical Exam:  There were no vitals taken for this visit.  Physical Exam Neurological:     Mental Status: She is alert and oriented to person, place, and time.     Cranial Nerves: No dysarthria.  Psychiatric:        Attention and Perception: Attention and perception normal.        Mood and Affect: Mood normal.        Speech: Speech normal.        Behavior: Behavior is cooperative.        Thought Content: Thought content normal. Thought content is not paranoid or delusional. Thought content does not include homicidal or suicidal ideation. Thought content does not include homicidal or suicidal plan.        Cognition and Memory: Cognition and memory normal.        Judgment: Judgment normal.     Comments: Insight intact     Lab Review:     Component Value Date/Time   NA 143 02/23/2023 1510   K 3.9 02/23/2023 1510   CL 103 02/23/2023 1510   CO2 25 02/23/2023 1510   GLUCOSE 86 02/23/2023 1510   BUN 16 02/23/2023 1510   CREATININE 0.80 02/23/2023 1510   CALCIUM 9.4 02/23/2023 1510   PROT 6.3 02/23/2023 1510   ALBUMIN 4.4 02/23/2023 1510  AST 22 02/23/2023 1510   ALT 30 02/23/2023 1510   ALKPHOS 83 02/23/2023 1510   BILITOT <0.2 02/23/2023 1510       Component Value Date/Time   WBC 4.8 02/23/2023 1510   RBC 4.81 02/23/2023 1510   HGB 14.2 02/23/2023 1510   HCT  43.7 02/23/2023 1510   PLT 236 02/23/2023 1510   MCV 91 02/23/2023 1510   MCH 29.5 02/23/2023 1510   MCHC 32.5 02/23/2023 1510   RDW 12.5 02/23/2023 1510   LYMPHSABS 1.5 02/23/2023 1510   EOSABS 0.1 02/23/2023 1510   BASOSABS 0.1 02/23/2023 1510    No results found for: "POCLITH", "LITHIUM"   No results found for: "PHENYTOIN", "PHENOBARB", "VALPROATE", "CBMZ"   .res Assessment: Plan:    37 minutes spent dedicated to the care of this patient on the date of this encounter to include pre-visit review of records, ordering of medication, post visit documentation, and face-to-face time with the patient discussing plan to change pharmacies. She reports that Lamictal 150 mg tabs were not listed on information she saw from [facility]. Will send script for 150 mg tabs and discussed that script could be changed to 100 mg 1.5 tablets if 150 mg tablets are not available.  Will continue Lamictal 150 mg daily for depression.  Continue Wellbutrin XL 300 mg daily for depression.  Continue Klonopin 0.5 mg 1/2-1 tab po BID prn anxiety.  Pt to follow-up in 6 months or sooner if clinically indicated.  Patient advised to contact office with any questions, adverse effects, or acute worsening in signs and symptoms.   Thurley "[name]" was seen today for follow-up.  Diagnoses and all orders for this visit:  Attention deficit hyperactivity disorder (ADHD), predominantly hyperactive type -     atomoxetine (STRATTERA) 80 MG capsule; Take 1 capsule (80 mg total) by mouth daily.  Recurrent major depressive disorder, in partial remission (HCC) -     buPROPion (WELLBUTRIN XL) 300 MG 24 hr tablet; Take 1 tablet (300 mg total) by mouth every morning. -     lamoTRIgine (LAMICTAL) 150 MG tablet; Take 1 tablet (150 mg total) by mouth daily.  Generalized anxiety disorder -     clonazePAM (KLONOPIN) 0.5 MG tablet; Take 1/2-1 tab po BID prn anxiety  Social anxiety disorder -     clonazePAM (KLONOPIN) 0.5  MG tablet; Take 1/2-1 tab po BID prn anxiety  Insomnia, unspecified type -     clonazePAM (KLONOPIN) 0.5 MG tablet; Take 1/2-1 tab po BID prn anxiety     Please see After Visit Summary for patient specific instructions.  Future Appointments  Date Time Provider Department Center  08/29/2023 10:05 AM Rakes, Doralee Albino, FNP [facility] None    No orders of the defined types were placed in this encounter.     -------------------------------

## 2023-03-21 NOTE — Addendum Note (Signed)
Addended by: Sonny Masters on: 03/21/2023 07:52 AM   Modules accepted: Orders

## 2023-03-29 ENCOUNTER — Encounter: Payer: Self-pay | Admitting: Psychiatry

## 2023-03-31 ENCOUNTER — Telehealth: Payer: Self-pay | Admitting: Psychiatry

## 2023-03-31 ENCOUNTER — Other Ambulatory Visit: Payer: Self-pay

## 2023-03-31 DIAGNOSIS — F411 Generalized anxiety disorder: Secondary | ICD-10-CM

## 2023-03-31 DIAGNOSIS — F4010 Social phobia, unspecified: Secondary | ICD-10-CM

## 2023-03-31 DIAGNOSIS — G4700 Insomnia, unspecified: Secondary | ICD-10-CM

## 2023-03-31 MED ORDER — CLONAZEPAM 0.5 MG PO TABS: ORAL_TABLET | ORAL | 0 refills | Status: DC

## 2023-03-31 NOTE — Telephone Encounter (Signed)
Pended.

## 2023-03-31 NOTE — Telephone Encounter (Signed)
Kathleen Soto called and LM at 11:44 to request that her prescription for Clonazepam be transferred to the [facility] in [place], [place] [phone].  [place] she forget her medication so needs it sent to where she is.

## 2023-04-11 ENCOUNTER — Encounter: Payer: Self-pay | Admitting: Family Medicine

## 2023-04-11 MED ORDER — EZETIMIBE 10 MG PO TABS: 10.0000 mg | ORAL_TABLET | Freq: Every day | ORAL | 0 refills | Status: DC

## 2023-04-11 MED ORDER — PANTOPRAZOLE SODIUM 40 MG PO TBEC: 40.0000 mg | DELAYED_RELEASE_TABLET | Freq: Every day | ORAL | 0 refills | Status: DC

## 2023-04-20 DIAGNOSIS — D171 Benign lipomatous neoplasm of skin and subcutaneous tissue of trunk: Secondary | ICD-10-CM | POA: Diagnosis not present

## 2023-05-19 ENCOUNTER — Other Ambulatory Visit: Payer: Self-pay | Admitting: General Surgery

## 2023-06-07 ENCOUNTER — Ambulatory Visit: Payer: Self-pay | Admitting: Family Medicine

## 2023-06-19 ENCOUNTER — Encounter (HOSPITAL_BASED_OUTPATIENT_CLINIC_OR_DEPARTMENT_OTHER): Payer: Self-pay | Admitting: General Surgery

## 2023-06-23 MED ORDER — CHLORHEXIDINE GLUCONATE CLOTH 2 % EX PADS: 6.0000 | MEDICATED_PAD | Freq: Once | CUTANEOUS | Status: DC

## 2023-06-23 NOTE — Progress Notes (Signed)

## 2023-06-24 MED ORDER — ACETAMINOPHEN 10 MG/ML IV SOLN
INTRAVENOUS | Status: AC
  Filled 2023-06-24: qty 100

## 2023-06-26 ENCOUNTER — Ambulatory Visit (HOSPITAL_BASED_OUTPATIENT_CLINIC_OR_DEPARTMENT_OTHER): Payer: BC Managed Care – PPO | Admitting: Certified Registered"

## 2023-06-26 ENCOUNTER — Encounter (HOSPITAL_BASED_OUTPATIENT_CLINIC_OR_DEPARTMENT_OTHER): Payer: Self-pay | Admitting: General Surgery

## 2023-06-26 ENCOUNTER — Encounter (HOSPITAL_BASED_OUTPATIENT_CLINIC_OR_DEPARTMENT_OTHER)
Admission: RE | Disposition: A | Discharge status: Home / Self Care | Payer: Self-pay | Source: Home / Self Care | Attending: General Surgery

## 2023-06-26 ENCOUNTER — Other Ambulatory Visit: Payer: Self-pay

## 2023-06-26 ENCOUNTER — Ambulatory Visit (HOSPITAL_BASED_OUTPATIENT_CLINIC_OR_DEPARTMENT_OTHER)
Admission: RE | Admit: 2023-06-26 | Discharge: 2023-06-26 | Disposition: A | Discharge status: Home / Self Care | Payer: BC Managed Care – PPO | Attending: General Surgery | Admitting: General Surgery

## 2023-06-26 DIAGNOSIS — D171 Benign lipomatous neoplasm of skin and subcutaneous tissue of trunk: Secondary | ICD-10-CM | POA: Diagnosis not present

## 2023-06-26 DIAGNOSIS — D1739 Benign lipomatous neoplasm of skin and subcutaneous tissue of other sites: Secondary | ICD-10-CM | POA: Diagnosis not present

## 2023-06-26 DIAGNOSIS — F419 Anxiety disorder, unspecified: Secondary | ICD-10-CM | POA: Diagnosis not present

## 2023-06-26 DIAGNOSIS — Z01818 Encounter for other preprocedural examination: Secondary | ICD-10-CM

## 2023-06-26 DIAGNOSIS — E039 Hypothyroidism, unspecified: Secondary | ICD-10-CM | POA: Diagnosis not present

## 2023-06-26 DIAGNOSIS — Z79899 Other long term (current) drug therapy: Secondary | ICD-10-CM | POA: Diagnosis not present

## 2023-06-26 DIAGNOSIS — D1722 Benign lipomatous neoplasm of skin and subcutaneous tissue of left arm: Secondary | ICD-10-CM | POA: Diagnosis not present

## 2023-06-26 DIAGNOSIS — K219 Gastro-esophageal reflux disease without esophagitis: Secondary | ICD-10-CM | POA: Diagnosis not present

## 2023-06-26 DIAGNOSIS — Z79890 Hormone replacement therapy (postmenopausal): Secondary | ICD-10-CM | POA: Diagnosis not present

## 2023-06-26 HISTORY — PX: LIPOMA EXCISION: SHX5283

## 2023-06-26 HISTORY — DX: Hypothyroidism, unspecified: E03.9

## 2023-06-26 HISTORY — DX: Other complications of anesthesia, initial encounter: T88.59XA

## 2023-06-26 HISTORY — DX: Urticaria, unspecified: L50.9

## 2023-06-26 HISTORY — DX: Nonrheumatic mitral (valve) prolapse: I34.1

## 2023-06-26 SURGERY — EXCISION LIPOMA
Anesthesia: Monitor Anesthesia Care | Site: Chest

## 2023-06-26 MED ORDER — BUPIVACAINE HCL (PF) 0.25 % IJ SOLN: INTRAMUSCULAR | Status: DC | PRN

## 2023-06-26 MED ORDER — OXYCODONE HCL 5 MG/5ML PO SOLN
5.0000 mg | Freq: Once | ORAL | Status: DC | PRN
Start: 2023-06-26 — End: 2023-06-26

## 2023-06-26 MED ORDER — ACETAMINOPHEN 500 MG PO TABS
1000.0000 mg | ORAL_TABLET | ORAL | Status: AC
  Administered 2023-06-26: 1000 mg via ORAL

## 2023-06-26 MED ORDER — LACTATED RINGERS IV SOLN: INTRAVENOUS | Status: DC

## 2023-06-26 MED ORDER — FENTANYL CITRATE (PF) 100 MCG/2ML IJ SOLN
INTRAMUSCULAR | Status: DC | PRN
  Administered 2023-06-26: 50 ug via INTRAVENOUS
  Administered 2023-06-26: 25 ug via INTRAVENOUS

## 2023-06-26 MED ORDER — LACTATED RINGERS IV SOLN: INTRAVENOUS | Status: DC | PRN

## 2023-06-26 MED ORDER — FENTANYL CITRATE (PF) 100 MCG/2ML IJ SOLN
INTRAMUSCULAR | Status: AC
  Filled 2023-06-26: qty 2

## 2023-06-26 MED ORDER — CEFAZOLIN SODIUM-DEXTROSE 2-4 GM/100ML-% IV SOLN
INTRAVENOUS | Status: AC
  Filled 2023-06-26: qty 100

## 2023-06-26 MED ORDER — ACETAMINOPHEN 500 MG PO TABS
ORAL_TABLET | ORAL | Status: AC
  Filled 2023-06-26: qty 2

## 2023-06-26 MED ORDER — FENTANYL CITRATE (PF) 100 MCG/2ML IJ SOLN: 25.0000 ug | INTRAMUSCULAR | Status: DC | PRN

## 2023-06-26 MED ORDER — MIDAZOLAM HCL 5 MG/5ML IJ SOLN
INTRAMUSCULAR | Status: DC | PRN
  Administered 2023-06-26: 2 mg via INTRAVENOUS

## 2023-06-26 MED ORDER — ACETAMINOPHEN 10 MG/ML IV SOLN: 1000.0000 mg | Freq: Once | INTRAVENOUS | Status: DC | PRN

## 2023-06-26 MED ORDER — ONDANSETRON HCL 4 MG/2ML IJ SOLN
INTRAMUSCULAR | Status: AC
  Filled 2023-06-26: qty 2

## 2023-06-26 MED ORDER — ACETAMINOPHEN 500 MG PO TABS: 1000.0000 mg | ORAL_TABLET | Freq: Once | ORAL | Status: DC | PRN

## 2023-06-26 MED ORDER — LIDOCAINE HCL (PF) 1 % IJ SOLN
INTRAMUSCULAR | Status: DC | PRN
  Administered 2023-06-26: 10 mL via SURGICAL_CAVITY

## 2023-06-26 MED ORDER — 0.9 % SODIUM CHLORIDE (POUR BTL) OPTIME
TOPICAL | Status: DC | PRN
  Administered 2023-06-26: 300 mL

## 2023-06-26 MED ORDER — OXYCODONE HCL 5 MG PO TABS: 5.0000 mg | ORAL_TABLET | Freq: Once | ORAL | Status: DC | PRN

## 2023-06-26 MED ORDER — ONDANSETRON HCL 4 MG/2ML IJ SOLN
INTRAMUSCULAR | Status: DC | PRN
  Administered 2023-06-26: 4 mg via INTRAVENOUS

## 2023-06-26 MED ORDER — PROPOFOL 500 MG/50ML IV EMUL
INTRAVENOUS | Status: DC | PRN
  Administered 2023-06-26: 200 ug/kg/min via INTRAVENOUS

## 2023-06-26 MED ORDER — ACETAMINOPHEN 160 MG/5ML PO SOLN: 1000.0000 mg | Freq: Once | ORAL | Status: DC | PRN

## 2023-06-26 MED ORDER — GLYCOPYRROLATE 0.2 MG/ML IJ SOLN
INTRAMUSCULAR | Status: DC | PRN
  Administered 2023-06-26: .2 mg via INTRAVENOUS

## 2023-06-26 MED ORDER — CEFAZOLIN SODIUM-DEXTROSE 2-4 GM/100ML-% IV SOLN
2.0000 g | INTRAVENOUS | Status: AC
  Administered 2023-06-26: 2 g via INTRAVENOUS

## 2023-06-26 MED ORDER — MIDAZOLAM HCL 2 MG/2ML IJ SOLN
INTRAMUSCULAR | Status: AC
  Filled 2023-06-26: qty 2

## 2023-06-26 SURGICAL SUPPLY — 40 items
BLADE CLIPPER SURG (BLADE) IMPLANT
BLADE SURG 15 STRL LF DISP TIS (BLADE) ×2 IMPLANT
CANISTER SUCT 1200ML W/VALVE (MISCELLANEOUS) IMPLANT
CHLORAPREP W/TINT 26 (MISCELLANEOUS) ×2 IMPLANT
COVER BACK TABLE 60X90IN (DRAPES) ×2 IMPLANT
COVER MAYO STAND STRL (DRAPES) ×2 IMPLANT
DERMABOND ADVANCED .7 DNX12 (GAUZE/BANDAGES/DRESSINGS) ×2 IMPLANT
DRAPE LAPAROTOMY 100X72 PEDS (DRAPES) ×2 IMPLANT
DRAPE UTILITY XL STRL (DRAPES) ×2 IMPLANT
DRSG TEGADERM 4X4.75 (GAUZE/BANDAGES/DRESSINGS) IMPLANT
ELECT COATED BLADE 2.86 ST (ELECTRODE) IMPLANT
ELECT REM PT RETURN 9FT ADLT (ELECTROSURGICAL) ×1 IMPLANT
ELECTRODE REM PT RTRN 9FT ADLT (ELECTROSURGICAL) ×2 IMPLANT
GAUZE PACKING IODOFORM 1/4X15 (PACKING) IMPLANT
GAUZE SPONGE 4X4 12PLY STRL LF (GAUZE/BANDAGES/DRESSINGS) IMPLANT
GLOVE BIO SURGEON STRL SZ7 (GLOVE) ×2 IMPLANT
GLOVE BIOGEL PI IND STRL 7.5 (GLOVE) ×2 IMPLANT
GOWN STRL REUS W/ TWL LRG LVL3 (GOWN DISPOSABLE) ×6 IMPLANT
HEMOSTAT ARISTA ABSORB 3G PWDR (HEMOSTASIS) IMPLANT
NDL HYPO 25X1 1.5 SAFETY (NEEDLE) ×2 IMPLANT
NEEDLE HYPO 25X1 1.5 SAFETY (NEEDLE) ×1 IMPLANT
NS IRRIG 1000ML POUR BTL (IV SOLUTION) IMPLANT
PACK BASIN DAY SURGERY FS (CUSTOM PROCEDURE TRAY) ×2 IMPLANT
PENCIL SMOKE EVACUATOR (MISCELLANEOUS) ×2 IMPLANT
SLEEVE SCD COMPRESS KNEE MED (STOCKING) ×2 IMPLANT
SPIKE FLUID TRANSFER (MISCELLANEOUS) IMPLANT
SUT ETHILON 2 0 FS 18 (SUTURE) IMPLANT
SUT MNCRL AB 4-0 PS2 18 (SUTURE) ×2 IMPLANT
SUT SILK 2 0 SH (SUTURE) IMPLANT
SUT VIC AB 2-0 SH 27XBRD (SUTURE) IMPLANT
SUT VIC AB 3-0 SH 27X BRD (SUTURE) IMPLANT
SUT VIC AB 4-0 PS2 18 (SUTURE) IMPLANT
SUT VICRYL 3-0 CR8 SH (SUTURE) IMPLANT
SWAB COLLECTION DEVICE MRSA (MISCELLANEOUS) IMPLANT
SWAB CULTURE ESWAB REG 1ML (MISCELLANEOUS) IMPLANT
SYR CONTROL 10ML LL (SYRINGE) ×2 IMPLANT
TOWEL GREEN STERILE FF (TOWEL DISPOSABLE) ×2 IMPLANT
TUBE CONNECTING 20X1/4 (TUBING) IMPLANT
UNDERPAD 30X36 HEAVY ABSORB (UNDERPADS AND DIAPERS) IMPLANT
YANKAUER SUCT BULB TIP NO VENT (SUCTIONS) IMPLANT

## 2023-06-26 NOTE — Anesthesia Preprocedure Evaluation (Addendum)
 Anesthesia Evaluation  Patient identified by MRN, date of birth, ID band Patient awake    Reviewed: Allergy & Precautions, NPO status , Patient's Chart, lab work & pertinent test results  Airway Mallampati: III  TM Distance: <3 FB Neck ROM: Full  Mouth opening: Limited Mouth Opening  Dental  (+) Teeth Intact, Dental Advisory Given   Pulmonary neg pulmonary ROS, neg shortness of breath, neg sleep apnea, neg COPD, neg recent URI   breath sounds clear to auscultation       Cardiovascular negative cardio ROS  Rhythm:Regular     Neuro/Psych  PSYCHIATRIC DISORDERS Anxiety     negative neurological ROS     GI/Hepatic Neg liver ROS,GERD  Medicated and Controlled,,  Endo/Other  Hypothyroidism    Renal/GU negative Renal ROS     Musculoskeletal tmj   Abdominal   Peds  Hematology negative hematology ROS (+)   Anesthesia Other Findings   Reproductive/Obstetrics                             Anesthesia Physical Anesthesia Plan  ASA: 2  Anesthesia Plan: MAC   Post-op Pain Management: Minimal or no pain anticipated   Induction: Intravenous  PONV Risk Score and Plan: 2 and Propofol  infusion, Treatment may vary due to age or medical condition and Ondansetron   Airway Management Planned: Natural Airway, Nasal Cannula and Simple Face Mask  Additional Equipment: None  Intra-op Plan:   Post-operative Plan:   Informed Consent: I have reviewed the patients History and Physical, chart, labs and discussed the procedure including the risks, benefits and alternatives for the proposed anesthesia with the patient or authorized representative who has indicated his/her understanding and acceptance.     Dental advisory given  Plan Discussed with: CRNA and Surgeon  Anesthesia Plan Comments:        Anesthesia Quick Evaluation

## 2023-06-26 NOTE — H&P (Signed)
 63 year old female who is fairly healthy. She does have a history of PAT. She has had a left anterior chest wall mass for at least the last couple of years. She has an ultrasound from a couple years ago that shows a cyst 2 cm. This clinically is appear to be a lipoma. She thinks this area is gotten bigger since then. It does irritate her with her bra. There is a redness overlying it when it gets irritated as well. She wants to discuss removal.  Review of Systems: A complete review of systems was obtained from the patient. I have reviewed this information and discussed as appropriate with the patient. See HPI as well for other ROS.  Review of Systems  HENT: Positive for congestion and tinnitus.  Cardiovascular: Positive for palpitations (in pregnancy).  Gastrointestinal: Positive for constipation, heartburn and nausea.  Musculoskeletal: Positive for neck pain.  Psychiatric/Behavioral: Positive for depression. The patient is nervous/anxious.  All other systems reviewed and are negative.   Medical History: Past Medical History:  Diagnosis Date  Anxiety  Arrhythmia  Arthritis  GERD (gastroesophageal reflux disease)  Heart valve disease  Hyperlipidemia  Thyroid  disease    Past Surgical History:  Procedure Laterality Date  HYSTERECTOMY  MASTECTOMY  REPEAT CESAREAN SECTION  TONSILLECTOMY   Allergies  Allergen Reactions  Prednisone  Other (See Comments)  Esophageal spasms  Heart races ( can take if really needed) Esophageal spasms  Sulfa (Sulfonamide Antibiotics) Other (See Comments)  Mouth sores  Metoclopramide Anxiety and Other (See Comments)  Reports adverse reaction (cognitive side effects) to IV Reglan   Current Outpatient Medications on File Prior to Visit  Medication Sig Dispense Refill  atomoxetine  (STRATTERA ) 80 MG capsule Take 80 mg by mouth  buPROPion  (WELLBUTRIN  XL) 300 MG XL tablet Take 300 mg by mouth every morning  clonazePAM  (KLONOPIN ) 0.5 MG tablet Take  1/2-1 tab po BID prn anxiety  ezetimibe  (ZETIA ) 10 mg tablet Take 1 tablet by mouth once daily  famotidine (PEPCID) 20 MG tablet TAKE 1 TABLET BY MOUTH TWICE DAILY FOR HIVES  lamoTRIgine  (LAMICTAL ) 150 MG tablet Take 1 tablet by mouth once daily  levocetirizine (XYZAL) 5 MG tablet Take 5 mg by mouth every evening  levothyroxine (SYNTHROID) 100 MCG tablet Take 100 mcg by mouth once daily  loratadine (CLARITIN) 10 mg tablet Take 10 mg by mouth once daily  pantoprazole  (PROTONIX ) 40 MG DR tablet Take 40 mg by mouth once daily   Family History  Problem Relation Age of Onset  High blood pressure (Hypertension) Mother  Skin cancer Father  High blood pressure (Hypertension) Father  Hyperlipidemia (Elevated cholesterol) Father  High blood pressure (Hypertension) Sister  Skin cancer Brother    Social History   Tobacco Use  Smoking Status Not on file  Smokeless Tobacco Not on file  Marital status: Married    Objective:   BP: (!) 152/88  Pulse: 98  Temp: 36.7 C (98 F)  SpO2: 97%  Weight: 66.7 kg (147 lb)  Height: 154.9 cm (5\' 1" )   Body mass index is 27.78 kg/m.  Physical Exam Vitals reviewed.  Constitutional:  Appearance: Normal appearance.  Chest:  Comments: 3 cm mobile mass soft nontender c/w lipoma Neurological:  Mental Status: She is alert.   Assessment and Plan:    Lipoma of chest wall   We discussed her options of observation versus excision. This area has gotten bigger and it is symptomatic so excision is reasonable. We discussed an excisional biopsy at the surgery center  with risks and recovery.

## 2023-06-26 NOTE — Interval H&P Note (Signed)
 History and Physical Interval Note:  06/26/2023 8:36 AM  Kathleen Soto  has presented today for surgery, with the diagnosis of Lipoma.  The various methods of treatment have been discussed with the patient and family. After consideration of risks, benefits and other options for treatment, the patient has consented to  Procedure(s) with comments: CHEST WALL LIPOMA EXCISION (N/A) - GENERAL & LMA as a surgical intervention.  The patient's history has been reviewed, patient examined, no change in status, stable for surgery.  I have reviewed the patient's chart and labs.  Questions were answered to the patient's satisfaction.     Enid Harry

## 2023-06-26 NOTE — Transfer of Care (Signed)
 Immediate Anesthesia Transfer of Care Note  Patient: Kathleen Soto  Procedure(s) Performed: CHEST WALL LIPOMA EXCISION (Chest)  Patient Location: PACU  Anesthesia Type:MAC  Level of Consciousness: awake, alert , and oriented  Airway & Oxygen Therapy: Patient Spontanous Breathing and Patient connected to face mask oxygen  Post-op Assessment: Report given to RN and Post -op Vital signs reviewed and stable  Post vital signs: Reviewed and stable  Last Vitals:  Vitals Value Taken Time  BP    Temp    Pulse    Resp    SpO2      Last Pain:  Vitals:   06/26/23 0759  TempSrc: Temporal  PainSc: 0-No pain      Patients Stated Pain Goal: 4 (06/26/23 0759)  Complications: No notable events documented.

## 2023-06-26 NOTE — Op Note (Signed)
 Preoperative diagnosis: Left shoulder mass consistent with lipoma Postoperative diagnosis: Same as above Procedure: Excision of 2 cm subcutaneous mass consistent clinically with a lipoma Surgeon: Dr. Donavan Fuchs Anesthesia: Sedation Estimated blood loss: Minimal Specimens: Lipoma to pathology Complications: None Drains: None Sponge needle count correct completion Disposition to recovery stable addition  Indications: This is a 63 year old female who has had a left anterior chest wall shoulder mass for a couple of years.  She has an ultrasound a couple of years ago that showed a 2 cm area.  She thinks its gotten bigger and is causing some symptoms as well.  We discussed removal.  Procedure: After informed consent was obtained she was taken to the operating room.  She was placed under sedation.  She was prepped and draped in a standard sterile surgical fashion.  Antibiotics were given.  Timeout was performed.  I will treat admixture of bupivacaine  and lidocaine .  I then made an incision overlying the mass.  I carried this down to the clinically appearing lipoma.  I then remove this in its entirety.  Hemostasis was observed.  I then closed this with 3-0 Vicryl for Monocryl.  Glue and a Steri-Strip were applied.  She tolerated this well was transferred recovery stable.

## 2023-06-26 NOTE — Discharge Instructions (Addendum)
 [facility] Office Phone Number [phone]  POST OP INSTRUCTIONS Take 400 mg of ibuprofen every 8 hours or 650 mg tylenol  every 6 hours for next 72 hours then as needed. Use ice several times daily also.  Take your usually prescribed medications unless otherwise directed You should eat very light the first 24 hours after surgery, such as soup, crackers, pudding, etc.  Resume your normal diet the day after surgery. Most patients will experience some swelling and bruising  Ice packs will help.  Swelling and bruising can take several days to resolve.  It is common to experience some constipation if taking pain medication after surgery.  Increasing fluid intake and taking a stool softener will usually help or prevent this problem from occurring.  A mild laxative (Milk of Magnesia or Miralax) should be taken according to package directions if there are no bowel movements after 48 hours. I used skin glue on the incision, you may shower in 24 hours.  The glue will flake off over the next 2-3 weeks.  Any sutures or staples will be removed at the office during your follow-up visit. ACTIVITIES:  You may resume regular daily activities (gradually increasing) beginning the next day.  You may have sexual intercourse when it is comfortable. You may drive when you no longer are taking prescription pain medication, you can comfortably wear a seatbelt, and you can safely maneuver your car and apply brakes. RETURN TO WORK:  ______________________________________________________________________________________ Kathleen Soto should see your doctor in the office for a follow-up appointment approximately 2-3 weeks after your surgery.  Your doctor's nurse will typically make your follow-up appointment when she calls you with your pathology report.  Expect your pathology report 3-4 business days after your surgery.  You may call to check if you do not hear from us  after three days. OTHER INSTRUCTIONS:  _______________________________________________________________________________________________ _____________________________________________________________________________________________________________________________________ _____________________________________________________________________________________________________________________________________ _____________________________________________________________________________________________________________________________________  WHEN TO CALL DR WAKEFIELD: Fever over 101.0 Nausea and/or vomiting. Extreme swelling or bruising. Continued bleeding from incision. Increased pain, redness, or drainage from the incision.  The clinic staff is available to answer your questions during regular business hours.  Please don't hesitate to call and ask to speak to one of the nurses for clinical concerns.  If you have a medical emergency, go to the nearest emergency room or call 911.  A surgeon from [facility] is always on call at the hospital.  For further questions, please visit [email or web address]  Last dose of Tylenol  was at 0800 (1000mg )   Post Anesthesia Home Care Instructions  Activity: Get plenty of rest for the remainder of the day. A responsible individual must stay with you for 24 hours following the procedure.  For the next 24 hours, DO NOT: -Drive a car -Advertising copywriter -Drink alcoholic beverages -Take any medication unless instructed by your physician -Make any legal decisions or sign important papers.  Meals: Start with liquid foods such as gelatin or soup. Progress to regular foods as tolerated. Avoid greasy, spicy, heavy foods. If nausea and/or vomiting occur, drink only clear liquids until the nausea and/or vomiting subsides. Call your physician if vomiting continues.  Special Instructions/Symptoms: Your throat may feel dry or sore from the anesthesia or the breathing tube placed in your  throat during surgery. If this causes discomfort, gargle with warm salt water. The discomfort should disappear within 24 hours.  If you had a scopolamine patch placed behind your ear for the management of post- operative nausea and/or vomiting:  1. The medication in the  patch is effective for 72 hours, after which it should be removed.  Wrap patch in a tissue and discard in the trash. Wash hands thoroughly with soap and water. 2. You may remove the patch earlier than 72 hours if you experience unpleasant side effects which may include dry mouth, dizziness or visual disturbances. 3. Avoid touching the patch. Wash your hands with soap and water after contact with the patch.

## 2023-06-27 ENCOUNTER — Encounter (HOSPITAL_BASED_OUTPATIENT_CLINIC_OR_DEPARTMENT_OTHER): Payer: Self-pay | Admitting: General Surgery

## 2023-06-27 LAB — SURGICAL PATHOLOGY

## 2023-06-28 NOTE — Anesthesia Postprocedure Evaluation (Signed)
Anesthesia Post Note  Patient: Kathleen Soto  Procedure(s) Performed: CHEST WALL LIPOMA EXCISION (Chest)     Patient location during evaluation: PACU Anesthesia Type: MAC Level of consciousness: awake and alert Pain management: pain level controlled Vital Signs Assessment: post-procedure vital signs reviewed and stable Respiratory status: spontaneous breathing, nonlabored ventilation and respiratory function stable Cardiovascular status: stable and blood pressure returned to baseline Postop Assessment: no apparent nausea or vomiting Anesthetic complications: no   No notable events documented.  Last Vitals:  Vitals:   06/26/23 1000 06/26/23 1043  BP: 124/84 125/83  Pulse: 95 91  Resp: 13 14  Temp:  36.7 C  SpO2: 98% 100%    Last Pain:  Vitals:   06/27/23 1002  TempSrc:   PainSc: 3                  Jakhia Buxton

## 2023-07-17 ENCOUNTER — Other Ambulatory Visit (HOSPITAL_BASED_OUTPATIENT_CLINIC_OR_DEPARTMENT_OTHER): Payer: Self-pay

## 2023-07-17 DIAGNOSIS — F3342 Major depressive disorder, recurrent, in full remission: Secondary | ICD-10-CM | POA: Diagnosis not present

## 2023-07-17 DIAGNOSIS — F411 Generalized anxiety disorder: Secondary | ICD-10-CM | POA: Diagnosis not present

## 2023-07-17 DIAGNOSIS — G4700 Insomnia, unspecified: Secondary | ICD-10-CM | POA: Diagnosis not present

## 2023-07-17 DIAGNOSIS — F909 Attention-deficit hyperactivity disorder, unspecified type: Secondary | ICD-10-CM | POA: Diagnosis not present

## 2023-07-17 MED ORDER — CLONAZEPAM 0.5 MG PO TABS
0.5000 mg | ORAL_TABLET | Freq: Two times a day (BID) | ORAL | 5 refills | Status: DC | PRN
Start: 2023-07-17 — End: 2024-04-09
  Filled 2023-07-17: qty 60, 30d supply, fill #0
  Filled 2023-09-18: qty 60, 30d supply, fill #1
  Filled 2023-11-10: qty 60, 30d supply, fill #2
  Filled 2024-01-02: qty 60, 30d supply, fill #3

## 2023-07-25 ENCOUNTER — Telehealth (INDEPENDENT_AMBULATORY_CARE_PROVIDER_SITE_OTHER): Admitting: Family Medicine

## 2023-07-25 ENCOUNTER — Other Ambulatory Visit (HOSPITAL_BASED_OUTPATIENT_CLINIC_OR_DEPARTMENT_OTHER): Payer: Self-pay

## 2023-07-25 ENCOUNTER — Encounter: Payer: Self-pay | Admitting: Family Medicine

## 2023-07-25 DIAGNOSIS — J0140 Acute pansinusitis, unspecified: Secondary | ICD-10-CM | POA: Diagnosis not present

## 2023-07-25 MED ORDER — AMOXICILLIN-POT CLAVULANATE 875-125 MG PO TABS
1.0000 | ORAL_TABLET | Freq: Two times a day (BID) | ORAL | 0 refills | Status: AC
  Filled 2023-07-25: qty 14, 7d supply, fill #0

## 2023-07-25 MED ORDER — FLUTICASONE PROPIONATE 50 MCG/ACT NA SUSP
2.0000 | Freq: Every day | NASAL | 6 refills | Status: DC
Start: 2023-07-25 — End: 2023-12-27
  Filled 2023-07-25: qty 16, 30d supply, fill #0

## 2023-07-25 NOTE — Progress Notes (Signed)
 Virtual Visit via Video   I connected with patient on 07/25/23 at 1345 by a video enabled telemedicine application and verified that I am speaking with the correct person using two identifiers.  Location patient: Home Location provider: [facility] Persons participating in the virtual visit: Patient and Provider  I discussed the limitations of evaluation and management by telemedicine and the availability of in person appointments. The patient expressed understanding and agreed to proceed.  Subjective:   HPI:  Pt presents today for  Chief Complaint  Patient presents with   Sinusitis   Sinusitis This is a new problem. The current episode started 1 to 4 weeks ago. The problem has been gradually worsening since onset. The pain is moderate. Associated symptoms include congestion, coughing, ear pain, headaches and sinus pressure. Pertinent negatives include no chills, diaphoresis, hoarse voice, neck pain, shortness of breath, sneezing, sore throat or swollen glands. Past treatments include acetaminophen, saline nose sprays and nasal decongestants. The treatment provided no relief.     Review of Systems  Constitutional:  Negative for chills, diaphoresis, fever, malaise/fatigue and weight loss.  HENT:  Positive for congestion, ear pain, sinus pressure and sinus pain. Negative for ear discharge, hearing loss, hoarse voice, nosebleeds, sneezing, sore throat and tinnitus.   Eyes: Negative.   Respiratory:  Positive for cough. Negative for hemoptysis, sputum production, shortness of breath, wheezing and stridor.   Cardiovascular: Negative.   Gastrointestinal: Negative.   Genitourinary: Negative.   Musculoskeletal:  Negative for back pain, falls, joint pain, myalgias and neck pain.  Skin:  Negative for itching and rash.  Neurological:  Positive for headaches. Negative for dizziness, tingling, tremors, sensory change, speech change, focal weakness, seizures, loss  of consciousness and weakness.  Psychiatric/Behavioral: Negative.    All other systems reviewed and are negative.    Patient Active Problem List   Diagnosis Date Noted   ADD (attention deficit disorder) 02/23/2023   Idiopathic urticaria 02/23/2023   Thyroid disease 02/23/2023   Vitamin D deficiency 02/23/2023   High risk medication use 02/23/2023   IBS (irritable bowel syndrome) 02/23/2023   History of colonic polyps 12/20/2021   Generalized anxiety disorder 03/07/2018   Major depressive disorder, recurrent episode, in full remission (HCC) 03/07/2018   Mixed hyperlipidemia 12/26/2009   OSTEOPENIA 12/26/2009   GERD 12/08/2006    Social History   Tobacco Use   Smoking status: Never   Smokeless tobacco: Never  Substance Use Topics   Alcohol use: Yes    Comment: rarely    Current Outpatient Medications:    amoxicillin-clavulanate (AUGMENTIN) 875-125 MG tablet, Take 1 tablet by mouth 2 (two) times daily for 7 days., Disp: 14 tablet, Rfl: 0   fluticasone (FLONASE) 50 MCG/ACT nasal spray, Place 2 sprays into both nostrils daily., Disp: 16 g, Rfl: 6   atomoxetine (STRATTERA) 80 MG capsule, Take 1 capsule (80 mg total) by mouth daily., Disp: 90 capsule, Rfl: 2   buPROPion (WELLBUTRIN XL) 300 MG 24 hr tablet, Take 1 tablet (300 mg total) by mouth every morning., Disp: 90 tablet, Rfl: 2   clonazePAM (KLONOPIN) 0.5 MG tablet, Take 1/2-1 tab po BID prn anxiety, Disp: 60 tablet, Rfl: 0   clonazePAM (KLONOPIN) 0.5 MG tablet, Take 1 tablet (0.5 mg total) by mouth 2 (two) times daily as needed., Disp: 60 tablet, Rfl: 5   dicyclomine (BENTYL) 10 MG capsule, Take 10 mg by mouth as needed for spasms., Disp: , Rfl:    dimenhyDRINATE (DRAMAMINE)  50 MG tablet, Take 50 mg by mouth every 8 (eight) hours as needed., Disp: , Rfl:    docusate sodium (COLACE) 50 MG capsule, Take 50 mg by mouth 2 (two) times daily., Disp: , Rfl:    ezetimibe (ZETIA) 10 MG tablet, Take 1 tablet (10 mg total) by mouth  daily., Disp: 90 tablet, Rfl: 0   Famotidine (PEPCID PO), Take by mouth daily at 12 noon., Disp: , Rfl:    Lactobacillus (REPHRESH PRO-B) CAPS, Take by mouth., Disp: , Rfl:    lamoTRIgine (LAMICTAL) 150 MG tablet, Take 1 tablet (150 mg total) by mouth daily., Disp: 90 tablet, Rfl: 2   levocetirizine (XYZAL) 5 MG tablet, Take 5 mg by mouth every evening., Disp: , Rfl:    Levothyroxine Sodium 100 MCG CAPS, Take 100 mcg by mouth daily. , Disp: , Rfl:    loratadine (CLARITIN) 10 MG tablet, Take 10 mg by mouth daily., Disp: , Rfl:    montelukast (SINGULAIR) 10 MG tablet, Take 10 mg by mouth at bedtime., Disp: , Rfl:    Omalizumab (XOLAIR [place]), Inject into the skin., Disp: , Rfl:    ondansetron (ZOFRAN) 4 MG tablet, Take by mouth at bedtime., Disp: , Rfl:    pantoprazole (PROTONIX) 40 MG tablet, Take 1 tablet (40 mg total) by mouth daily., Disp: 90 tablet, Rfl: 0   Turmeric (QC TUMERIC COMPLEX PO), Take by mouth., Disp: , Rfl:   Allergies  Allergen Reactions   Prednisone     Esophageal spasms   Sulfonamide Derivatives     REACTION: mouth ulcers   Metoclopramide Other (See Comments)    Reports adverse reaction (cognitive side effects) to IV Reglan  Pt states she gets severe confusion    Objective:   There were no vitals taken for this visit.  Patient is well-developed, well-nourished in no acute distress.  Resting comfortably at home.  Head is normocephalic, atraumatic.  No labored breathing.  Speech is clear and coherent with logical content.  Patient is alert and oriented at baseline.    Assessment and Plan:   Kathleen "[name]" was seen today for sinusitis.  Diagnoses and all orders for this visit:  Acute non-recurrent pansinusitis Has tried and failed symptomatic care at home. Will initiate below. Aware to report new, worsening, or persistent symptoms.  -     fluticasone (FLONASE) 50 MCG/ACT nasal spray; Place 2 sprays into both nostrils daily. -     amoxicillin-clavulanate  (AUGMENTIN) 875-125 MG tablet; Take 1 tablet by mouth 2 (two) times daily for 7 days.      Return if symptoms worsen or fail to improve.  Kari Baars, FNP-C [facility] [address] [place], [place] [address] [phone]  07/25/2023  Time spent with the patient: 10 minutes, of which >50% was spent in obtaining information about symptoms, reviewing previous labs, evaluations, and treatments, counseling about condition (please see the discussed topics above), and developing a plan to further investigate it; had a number of questions which I addressed.

## 2023-07-26 ENCOUNTER — Encounter: Payer: Self-pay | Admitting: Family Medicine

## 2023-07-27 ENCOUNTER — Other Ambulatory Visit: Payer: Self-pay | Admitting: Family Medicine

## 2023-07-27 ENCOUNTER — Other Ambulatory Visit (HOSPITAL_BASED_OUTPATIENT_CLINIC_OR_DEPARTMENT_OTHER): Payer: Self-pay

## 2023-07-27 DIAGNOSIS — N898 Other specified noninflammatory disorders of vagina: Secondary | ICD-10-CM

## 2023-07-27 MED ORDER — FLUCONAZOLE 150 MG PO TABS
150.0000 mg | ORAL_TABLET | Freq: Once | ORAL | 0 refills | Status: AC
  Filled 2023-07-27: qty 1, 1d supply, fill #0

## 2023-08-14 ENCOUNTER — Encounter: Payer: Self-pay | Admitting: Family Medicine

## 2023-08-15 ENCOUNTER — Other Ambulatory Visit (HOSPITAL_BASED_OUTPATIENT_CLINIC_OR_DEPARTMENT_OTHER): Payer: Self-pay

## 2023-08-15 ENCOUNTER — Other Ambulatory Visit: Payer: Self-pay

## 2023-08-15 MED ORDER — PANTOPRAZOLE SODIUM 40 MG PO TBEC
40.0000 mg | DELAYED_RELEASE_TABLET | Freq: Every day | ORAL | 0 refills | Status: DC
  Filled 2023-08-15: qty 30, 30d supply, fill #0

## 2023-08-15 MED ORDER — EZETIMIBE 10 MG PO TABS
10.0000 mg | ORAL_TABLET | Freq: Every day | ORAL | 0 refills | Status: DC
  Filled 2023-08-15: qty 30, 30d supply, fill #0

## 2023-08-22 ENCOUNTER — Telehealth: Payer: Self-pay | Admitting: Family Medicine

## 2023-08-22 ENCOUNTER — Telehealth: Payer: Self-pay

## 2023-08-22 NOTE — Telephone Encounter (Signed)
 NO sooner CPE apts  Copied from CRM [id number]. Topic: Appointments - Appointment Cancel/Reschedule >> Aug 22, 2023  9:07 AM Kathleen Soto wrote: Patient/patient representative is calling to cancel or reschedule an appointment. Refer to attachments for appointment information.   Patient has been added to the waitlist for physical, but requesting a call instead of mychart message if there is a sooner appointment available.   Please assist pt further

## 2023-08-22 NOTE — Telephone Encounter (Signed)
 Copied from CRM [id number]. Topic: Clinical - Request for Lab/Test Order >> Aug 22, 2023  9:15 AM Shon Hale wrote: Reason for CRM: Pt inquiring if labs are needed to be done before physical. If so please advise patient, pt gets labs done at [facility] center due to it being closer to home.   Please advise

## 2023-08-22 NOTE — Telephone Encounter (Signed)
 Sent mychart message

## 2023-08-29 ENCOUNTER — Encounter: Payer: BC Managed Care – PPO | Admitting: Family Medicine

## 2023-08-30 ENCOUNTER — Encounter: Payer: Self-pay | Admitting: Family Medicine

## 2023-08-30 ENCOUNTER — Ambulatory Visit: Admitting: Family Medicine

## 2023-08-30 VITALS — BP 136/88 | HR 96 | Temp 97.2°F | Ht 61.0 in | Wt 153.2 lb

## 2023-08-30 DIAGNOSIS — Z0000 Encounter for general adult medical examination without abnormal findings: Secondary | ICD-10-CM

## 2023-08-30 DIAGNOSIS — G250 Essential tremor: Secondary | ICD-10-CM

## 2023-08-30 DIAGNOSIS — M949 Disorder of cartilage, unspecified: Secondary | ICD-10-CM | POA: Diagnosis not present

## 2023-08-30 DIAGNOSIS — E079 Disorder of thyroid, unspecified: Secondary | ICD-10-CM

## 2023-08-30 DIAGNOSIS — Z1159 Encounter for screening for other viral diseases: Secondary | ICD-10-CM

## 2023-08-30 DIAGNOSIS — R270 Ataxia, unspecified: Secondary | ICD-10-CM | POA: Insufficient documentation

## 2023-08-30 DIAGNOSIS — R631 Polydipsia: Secondary | ICD-10-CM

## 2023-08-30 DIAGNOSIS — Z136 Encounter for screening for cardiovascular disorders: Secondary | ICD-10-CM

## 2023-08-30 DIAGNOSIS — Z0001 Encounter for general adult medical examination with abnormal findings: Secondary | ICD-10-CM | POA: Diagnosis not present

## 2023-08-30 DIAGNOSIS — Z79899 Other long term (current) drug therapy: Secondary | ICD-10-CM | POA: Diagnosis not present

## 2023-08-30 DIAGNOSIS — M899 Disorder of bone, unspecified: Secondary | ICD-10-CM

## 2023-08-30 DIAGNOSIS — Z114 Encounter for screening for human immunodeficiency virus [HIV]: Secondary | ICD-10-CM

## 2023-08-30 DIAGNOSIS — Z820 Family history of epilepsy and other diseases of the nervous system: Secondary | ICD-10-CM

## 2023-08-30 DIAGNOSIS — E559 Vitamin D deficiency, unspecified: Secondary | ICD-10-CM

## 2023-08-30 DIAGNOSIS — E782 Mixed hyperlipidemia: Secondary | ICD-10-CM | POA: Diagnosis not present

## 2023-08-30 LAB — BAYER DCA HB A1C WAIVED: HB A1C (BAYER DCA - WAIVED): 5.4 % (ref 4.8–5.6)

## 2023-08-30 LAB — LIPID PANEL

## 2023-08-30 MED ORDER — PANTOPRAZOLE SODIUM 40 MG PO TBEC: 40.0000 mg | DELAYED_RELEASE_TABLET | Freq: Every day | ORAL | 1 refills | Status: DC

## 2023-08-30 MED ORDER — EZETIMIBE 10 MG PO TABS: 10.0000 mg | ORAL_TABLET | Freq: Every day | ORAL | 1 refills | Status: DC

## 2023-08-30 NOTE — Progress Notes (Signed)
 Complete physical exam  Patient: Kathleen Soto   DOB: 07-06-1960   63 y.o. Female  MRN: [id number]  Subjective:    Chief Complaint  Patient presents with   Annual Exam    Kathleen Soto is a 63 y.o. female who presents today for a complete physical exam. She reports consuming a general diet. The patient does not participate in regular exercise at present. She generally feels well. She reports sleeping well. She does have additional problems to discuss today.    History of Present Illness   Kathleen Soto "[name]" is a 63 year old female who presents for an annual physical exam.  She has a history of adenomyosis, which led to a hysterectomy in 2004, but she retains her ovaries and undergoes regular checks. No recent abnormal Pap smears or related procedures have been noted.  Her current medications include Lamictal and Zetia. She experienced a lapse in Zetia, potentially affecting her cholesterol levels, with the last recorded cholesterol at 208 and LDL at 110. She previously took vitamin D supplements, which were discontinued due to fluctuating levels.  She has a history of anxiety, frequently feeling anxious due to life stressors, including having two children in the [employer]. She experiences frequent thirst, suspecting dehydration from inadequate water intake, and lightheadedness upon standing quickly.  She has essential tremor, noticeable during tasks like eating soup or driving. She experiences tinnitus, described as a constant hum similar to cicadas, which she associates with Wellbutrin. Despite not having a hearing screening, she reports good hearing. Occasional dizziness and lightheadedness are attributed to Xolair.  She manages constipation with stool softeners, noting it worsens with Zofran use. She also experiences dry mouth and occasional achiness, which she describes as not significant.  Her family history includes Parkinson's disease, essential tremor, multiple  sclerosis, colon cancer, and breast cancer. Her mother had Parkinson's, and multiple sclerosis is present in her family, affecting her mother's sister and a cousin.  She has a history of tripping and poor balance, attributing it to clumsiness and possibly anxiety. She has not undergone imaging studies for this but has seen neurology for a disc issue in the past.      Most recent fall risk assessment:    08/30/2023    8:57 AM  Fall Risk   Falls in the past year? 1  Number falls in past yr: 1  Injury with Fall? 1  Risk for fall due to : History of fall(s)  Follow up Falls evaluation completed     Most recent depression screenings:    08/30/2023    8:57 AM 02/23/2023    2:07 PM  Depression screen PHQ 2/9  Decreased Interest 0 0  Down, Depressed, Hopeless 1 0  PHQ - 2 Score 1 0  Altered sleeping 0 0  Tired, decreased energy 2 0  Change in appetite 3 3  Feeling bad or failure about yourself  0 0  Trouble concentrating 0 2  Moving slowly or fidgety/restless 0 0  Suicidal thoughts 0 0  PHQ-9 Score 6 5  Difficult doing work/chores Not difficult at all Somewhat difficult      08/30/2023    8:57 AM 02/23/2023    2:07 PM  GAD 7 : Generalized Anxiety Score  Nervous, Anxious, on Edge 2 1  Control/stop worrying 2 1  Worry too much - different things 2 2  Trouble relaxing 1 0  Restless 1 0  Easily annoyed or irritable 2 1  Afraid - awful might happen  2 0  Total GAD 7 Score 12 5  Anxiety Difficulty Somewhat difficult Not difficult at all      Vision:Within last year and Dental: No current dental problems and Receives regular dental care  Patient Active Problem List   Diagnosis Date Noted   Essential tremor 08/30/2023   Ataxia 08/30/2023   ADD (attention deficit disorder) 02/23/2023   Idiopathic urticaria 02/23/2023   Thyroid disease 02/23/2023   Vitamin D deficiency 02/23/2023   High risk medication use 02/23/2023   IBS (irritable bowel syndrome) 02/23/2023   History of  colonic polyps 12/20/2021   Generalized anxiety disorder 03/07/2018   Major depressive disorder, recurrent episode, in full remission (HCC) 03/07/2018   Mixed hyperlipidemia 12/26/2009   OSTEOPENIA 12/26/2009   GERD 12/08/2006   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   Depression    GERD (gastroesophageal reflux disease)    Hives    Hyperlipidemia    Hypothyroidism    Mitral valve prolapse    Osteoporosis    Osteoporosis    Thyroid disease    Urticaria, chronic    Past Surgical History:  Procedure Laterality Date   ABDOMINAL HYSTERECTOMY     ANAL FISSURE REPAIR     BREAST BIOPSY     CESAREAN SECTION     EXPLORATORY LAPAROTOMY     LIPOMA EXCISION N/A 06/26/2023   Procedure: CHEST WALL LIPOMA EXCISION;  Surgeon: Enid Harry, MD;  Location: [facility];  Service: General;  Laterality: N/A;  GENERAL & LMA   TONSILLECTOMY     Social History   Tobacco Use   Smoking status: Never   Smokeless tobacco: Never  Vaping Use   Vaping status: Never Used  Substance Use Topics   Alcohol use: Yes    Comment: rarely   Drug use: No   Social History   Socioeconomic History   Marital status: Married    Spouse name: Not on file   Number of children: 2   Years of education: Not on file   Highest education level: Not on file  Occupational History   Not on file  Tobacco Use   Smoking status: Never   Smokeless tobacco: Never  Vaping Use   Vaping status: Never Used  Substance and Sexual Activity   Alcohol use: Yes    Comment: rarely   Drug use: No   Sexual activity: Not on file  Other Topics Concern   Not on file  Social History Narrative   Not on file   Social Drivers of Health   Financial Resource Strain: Low Risk  (09/28/2022)   Received from [facility]   Overall Financial Resource Strain (CARDIA)    Difficulty of Paying Living Expenses: Not hard at  all  Food Insecurity: No Food Insecurity (09/28/2022)   Received from [facility]   Hunger Vital Sign    Worried About Running Out of Food in the Last Year: Never true    Ran Out of Food in the Last Year: Never true  Transportation Needs: No Transportation Needs (09/28/2022)   Received from [facility] - Transportation    Lack of Transportation (Medical): No    Lack of Transportation (Non-Medical): No  Physical Activity: Insufficiently Active (09/28/2022)   Received from [facility]   Exercise Vital Sign    Days of Exercise per Week: 2 days    Minutes  of Exercise per Session: 20 min  Stress: No Stress Concern Present (09/28/2022)   Received from [facility] - Occupational Stress Questionnaire    Feeling of Stress : Only a little  Social Connections: Socially Integrated (09/28/2022)   Received from [facility]   Social Network    How would you rate your social network (family, work, friends)?: Good participation with social networks  Intimate Partner Violence: Not At Risk (09/28/2022)   Received from [facility]   HITS    Over the last 12 months how often did your partner physically hurt you?: Never    Over the last 12 months how often did your partner insult you or talk down to you?: Sometimes    Over the last 12 months how often did your partner threaten you with physical harm?: Never    Over the last 12 months how often did your partner scream or curse at you?: Sometimes   Family Status  Relation Name Status   Mother  Deceased   Father  Deceased   Sister RA Alive   Sister  Audiological scientist  (Not Specified)   Son 2 Alive   MGM  Deceased   MGF  Deceased   PGM  Deceased   PGF  Deceased  No partnership data on file   Family History  Problem Relation Age of Onset   Parkinson's disease Mother    Thyroid disease Father    Depression Father    Prostate cancer Father    Leukemia Father    Thyroid disease Sister     Alcohol abuse Sister    Cancer Brother    Alcohol abuse Paternal Grandfather    Rheum arthritis Paternal Grandfather    Allergies  Allergen Reactions   Prednisone     Esophageal spasms   Sulfonamide Derivatives Other (See Comments)    Mouth sores   Metoclopramide Other (See Comments)    Reports adverse reaction (cognitive side effects) to IV Reglan  Pt states she gets severe confusion      Patient Care Team: Sonny Masters, FNP as PCP - General (Family Medicine)   Outpatient Medications Prior to Visit  Medication Sig   atomoxetine (STRATTERA) 80 MG capsule Take 1 capsule (80 mg total) by mouth daily.   buPROPion (WELLBUTRIN XL) 300 MG 24 hr tablet Take 1 tablet (300 mg total) by mouth every morning.   clonazePAM (KLONOPIN) 0.5 MG tablet Take 1 tablet (0.5 mg total) by mouth 2 (two) times daily as needed.   dicyclomine (BENTYL) 10 MG capsule Take 10 mg by mouth as needed for spasms.   dimenhyDRINATE (DRAMAMINE) 50 MG tablet Take 50 mg by mouth every 8 (eight) hours as needed.   docusate sodium (COLACE) 50 MG capsule Take 50 mg by mouth 2 (two) times daily.   Famotidine (PEPCID PO) Take by mouth daily at 12 noon.   fluticasone (FLONASE) 50 MCG/ACT nasal spray Place 2 sprays into both nostrils daily.   Lactobacillus (REPHRESH PRO-B) CAPS Take by mouth.   levocetirizine (XYZAL) 5 MG tablet Take 5 mg by mouth every evening.   Levothyroxine Sodium 100 MCG CAPS Take 100 mcg by mouth daily.    loratadine (CLARITIN) 10 MG tablet Take 10 mg by mouth daily.   montelukast (SINGULAIR) 10 MG tablet Take 10 mg by mouth at bedtime.   Omalizumab (XOLAIR [place]) Inject into the skin.   ondansetron (ZOFRAN) 4 MG tablet Take by mouth  at bedtime.   [DISCONTINUED] ezetimibe (ZETIA) 10 MG tablet Take 1 tablet (10 mg total) by mouth daily.   [DISCONTINUED] pantoprazole (PROTONIX) 40 MG tablet Take 1 tablet (40 mg total) by mouth daily.   lamoTRIgine (LAMICTAL) 150 MG tablet Take 1 tablet (150 mg  total) by mouth daily.   [DISCONTINUED] clonazePAM (KLONOPIN) 0.5 MG tablet Take 1/2-1 tab po BID prn anxiety   [DISCONTINUED] Turmeric (QC TUMERIC COMPLEX PO) Take by mouth. (Patient not taking: Reported on 08/30/2023)   No facility-administered medications prior to visit.   ROS per HPI     Objective:     BP 136/88   Pulse 96   Temp (!) 97.2 F (36.2 C)   Ht 5\' 1"  (1.549 m)   Wt 153 lb 3.2 oz (69.5 kg)   SpO2 98%   BMI 28.95 kg/m  BP Readings from Last 3 Encounters:  08/30/23 136/88  06/26/23 125/83  02/23/23 135/85   Wt Readings from Last 3 Encounters:  08/30/23 153 lb 3.2 oz (69.5 kg)  06/26/23 147 lb 11.3 oz (67 kg)  02/23/23 145 lb 6.4 oz (66 kg)   SpO2 Readings from Last 3 Encounters:  08/30/23 98%  06/26/23 100%  02/23/23 97%      Physical Exam Vitals and nursing note reviewed.  Constitutional:      General: She is not in acute distress.    Appearance: Normal appearance. She is well-developed, well-groomed and overweight. She is not ill-appearing, toxic-appearing or diaphoretic.  HENT:     Head: Normocephalic and atraumatic.     Jaw: There is normal jaw occlusion.     Right Ear: Hearing, tympanic membrane, ear canal and external ear normal.     Left Ear: Hearing, tympanic membrane, ear canal and external ear normal.     Nose: Nose normal.     Mouth/Throat:     Lips: Pink.     Mouth: Mucous membranes are moist.     Pharynx: Oropharynx is clear. Uvula midline.  Eyes:     General: Lids are normal. Gaze aligned appropriately. No visual field deficit.    Extraocular Movements: Extraocular movements intact.     Conjunctiva/sclera: Conjunctivae normal.     Pupils: Pupils are equal, round, and reactive to light.  Neck:     Thyroid: No thyroid mass, thyromegaly or thyroid tenderness.     Vascular: No carotid bruit or JVD.     Trachea: Trachea and phonation normal.  Cardiovascular:     Rate and Rhythm: Normal rate and regular rhythm.     Chest Wall: PMI is  not displaced.     Pulses: Normal pulses.     Heart sounds: Normal heart sounds. No murmur heard.    No friction rub. No gallop.  Pulmonary:     Effort: Pulmonary effort is normal. No respiratory distress.     Breath sounds: Normal breath sounds. No wheezing.  Abdominal:     General: Abdomen is flat. Bowel sounds are normal. There is no distension or abdominal bruit.     Palpations: Abdomen is soft. There is no hepatomegaly or splenomegaly.     Tenderness: There is no abdominal tenderness. There is no right CVA tenderness or left CVA tenderness.     Hernia: No hernia is present.  Musculoskeletal:        General: Normal range of motion.     Cervical back: Normal range of motion and neck supple.     Right lower leg: No edema.  Left lower leg: No edema.  Lymphadenopathy:     Cervical: No cervical adenopathy.  Skin:    General: Skin is warm and dry.     Capillary Refill: Capillary refill takes less than 2 seconds.     Coloration: Skin is not cyanotic, jaundiced or pale.     Findings: No rash.  Neurological:     General: No focal deficit present.     Mental Status: She is alert and oriented to person, place, and time.     GCS: GCS eye subscore is 4. GCS verbal subscore is 5. GCS motor subscore is 6.     Cranial Nerves: No cranial nerve deficit, dysarthria or facial asymmetry.     Sensory: Sensation is intact.     Motor: Tremor present.     Coordination: Romberg sign positive.     Gait: Tandem walk abnormal.     Deep Tendon Reflexes: Reflexes are normal and symmetric.  Psychiatric:        Attention and Perception: Attention and perception normal.        Mood and Affect: Mood and affect normal.        Speech: Speech normal.        Behavior: Behavior normal. Behavior is cooperative.        Thought Content: Thought content normal.        Cognition and Memory: Cognition and memory normal.        Judgment: Judgment normal.       Last CBC Lab Results  Component Value Date    WBC 4.8 02/23/2023   HGB 14.2 02/23/2023   HCT 43.7 02/23/2023   MCV 91 02/23/2023   MCH 29.5 02/23/2023   RDW 12.5 02/23/2023   PLT 236 02/23/2023   Last metabolic panel Lab Results  Component Value Date   GLUCOSE 86 02/23/2023   NA 143 02/23/2023   K 3.9 02/23/2023   CL 103 02/23/2023   CO2 25 02/23/2023   BUN 16 02/23/2023   CREATININE 0.80 02/23/2023   EGFR 84 02/23/2023   CALCIUM 9.4 02/23/2023   PROT 6.3 02/23/2023   ALBUMIN 4.4 02/23/2023   LABGLOB 1.9 02/23/2023   BILITOT <0.2 02/23/2023   ALKPHOS 83 02/23/2023   AST 22 02/23/2023   ALT 30 02/23/2023   Last lipids Lab Results  Component Value Date   CHOL 208 (H) 02/23/2023   HDL 54 02/23/2023   LDLCALC 110 (H) 02/23/2023   TRIG 259 (H) 02/23/2023   CHOLHDL 3.9 02/23/2023   Last vitamin D Lab Results  Component Value Date   VD25OH 34.9 02/23/2023       Assessment & Plan:    Routine Health Maintenance and Physical Exam  Immunization History  Administered Date(s) Administered   Fluzone Influenza virus vaccine,trivalent (IIV3), split virus 04/13/2007, 04/09/2008, 03/26/2009   Influenza Inj Mdck Quad Pf 02/25/2021, 03/28/2022   Influenza, Quadrivalent, Recombinant, Inj, Pf 04/25/2020   Influenza, Seasonal, Injecte, Preservative Fre 03/24/2011, 03/22/2012   Influenza,inj,Quad PF,6+ Mos 02/06/2014, 03/13/2015, 01/27/2017, 02/02/2018   Influenza,trivalent, recombinat, inj, PF 04/16/2013   Influenza-Unspecified 03/06/2016   Moderna Covid-19 Vaccine Bivalent Booster 87yrs & up 06/01/2020, 06/01/2021   Moderna Sars-Covid-2 Vaccination 07/05/2019, 08/02/2019   PNEUMOCOCCAL CONJUGATE-20 08/30/2023   Td 11/13/2000   Tdap 05/06/2010, 09/15/2020    Health Maintenance  Topic Date Due   HIV Screening  Never done   Hepatitis C Screening  Never done   MAMMOGRAM  Never done   COVID-19 Vaccine (5 - 2024-25 season)  09/15/2023 (Originally 01/15/2023)   Zoster Vaccines- Shingrix (1 of 2) 11/29/2023 (Originally  [birth year]/1981)   INFLUENZA VACCINE  12/15/2023   DTaP/Tdap/Td (4 - Td or Tdap) 09/16/2030   Colonoscopy  12/10/2031   Pneumococcal Vaccine 69-29 Years old  Completed   HPV VACCINES  Aged Out   Meningococcal B Vaccine  Aged Out    Discussed health benefits of physical activity, and encouraged her to engage in regular exercise appropriate for her age and condition.  Problem List Items Addressed This Visit       Endocrine   Thyroid disease   Relevant Orders   Thyroid Panel With TSH     Nervous and Auditory   Essential tremor   Relevant Orders   Ambulatory referral to Neurology     Musculoskeletal and Integument   OSTEOPENIA   Relevant Orders   CMP14+EGFR   VITAMIN D 25 Hydroxy (Vit-D Deficiency, Fractures)     Other   Mixed hyperlipidemia   Relevant Medications   ezetimibe (ZETIA) 10 MG tablet   Other Relevant Orders   Lipid panel   CMP14+EGFR   Vitamin D deficiency   Relevant Orders   CMP14+EGFR   VITAMIN D 25 Hydroxy (Vit-D Deficiency, Fractures)   High risk medication use   Relevant Orders   CBC with Differential/Platelet   Thyroid Panel With TSH   Lamotrigine level   Bayer DCA Hb A1c Waived   Ataxia   Relevant Orders   Ambulatory referral to Neurology   Vitamin B12   Other Visit Diagnoses       Annual physical exam    -  Primary   Relevant Orders   CBC with Differential/Platelet   Lipid panel   Thyroid Panel With TSH   CMP14+EGFR   Hepatitis C Antibody   HIV antibody (with reflex)   Bayer DCA Hb A1c Waived   Pneumococcal conjugate vaccine 20-valent (Prevnar 20) (Completed)     Need for hepatitis C screening test       Relevant Orders   Hepatitis C Antibody     Screening for HIV (human immunodeficiency virus)       Relevant Orders   HIV antibody (with reflex)     Encounter for screening for cardiovascular disorders       Relevant Orders   Lipid panel   CMP14+EGFR     Increased thirst       Relevant Orders   Bayer DCA Hb A1c Waived     Family  history of Parkinson disease       Relevant Orders   Ambulatory referral to Neurology     Family history of MS (multiple sclerosis)       Relevant Orders   Ambulatory referral to Neurology     Assessment and Plan    Essential Tremor Long-standing essential tremor, previously managed with propranolol. Family history of Parkinson's and MS. Concerns about clumsiness and balance issues. - Refer to neurology for further evaluation and potential MRI  Thirst and Dehydration Reports constant thirst and possible dehydration. Concerns about potential diabetes. - Check A1c levels  Hypercholesterolemia Previous cholesterol levels: total cholesterol 208 mg/dL, LDL 161 mg/dL. She was out of Zetia, which may affect current levels. Eye examination indicated possible high cholesterol. - Check cholesterol levels  Osteopenia Previous DEXA scan indicated osteopenia. Not currently taking calcium or vitamin D supplements. - Recommend calcium and vitamin D supplementation - If DEXA scan is not scheduled at [facility], contact provider for order  Tinnitus Experiences constant tinnitus,  described as a cicada-like hum. Possible medication side effect, particularly from Wellbutrin. - Consider hearing screening if tinnitus becomes problematic  General Health Maintenance Thyroid function monitored by endocrinologist. BMI is 28, indicating overweight status. Interest in weight management options. - Check vitamin D levels - Discuss weight management options with insurance - Encourage reduction of carbohydrate intake  Mammogram Screening Receives mammograms at [place]. Awaiting notification for next mammogram. - If no notification for mammogram is received, contact provider for order  Pap Smear Screening Hysterectomy in 2004 due to adenomyosis, not cancerous. No cervix present. - Discontinue Pap smears  Follow-up Follow up on various screenings and tests as discussed. Neurology referral  placed. - Follow up with neurology referral - Follow up on lab results and screenings - Contact provider if no notification for mammogram or DEXA scan is received       Return in about 1 year (around 08/29/2024) for Annual Physical.     Kattie Parrot, FNP

## 2023-08-31 LAB — CBC WITH DIFFERENTIAL/PLATELET
Basophils Absolute: 0 10*3/uL (ref 0.0–0.2)
Basos: 1 %
EOS (ABSOLUTE): 0.1 10*3/uL (ref 0.0–0.4)
Eos: 2 %
Hematocrit: 43.8 % (ref 34.0–46.6)
Hemoglobin: 14.4 g/dL (ref 11.1–15.9)
Immature Grans (Abs): 0 10*3/uL (ref 0.0–0.1)
Immature Granulocytes: 0 %
Lymphocytes Absolute: 1.5 10*3/uL (ref 0.7–3.1)
Lymphs: 30 %
MCH: 29.3 pg (ref 26.6–33.0)
MCHC: 32.9 g/dL (ref 31.5–35.7)
MCV: 89 fL (ref 79–97)
Monocytes Absolute: 0.5 10*3/uL (ref 0.1–0.9)
Monocytes: 9 %
Neutrophils Absolute: 2.7 10*3/uL (ref 1.4–7.0)
Neutrophils: 58 %
Platelets: 224 10*3/uL (ref 150–450)
RBC: 4.92 x10E6/uL (ref 3.77–5.28)
RDW: 12.5 % (ref 11.7–15.4)
WBC: 4.8 10*3/uL (ref 3.4–10.8)

## 2023-08-31 LAB — LIPID PANEL
Cholesterol, Total: 190 mg/dL (ref 100–199)
HDL: 55 mg/dL (ref >39)
LDL CALC COMMENT:: 3.5 ratio (ref 0.0–4.4)
LDL Chol Calc (NIH): 107 mg/dL — ABNORMAL HIGH (ref 0–99)
Triglycerides: 162 mg/dL — ABNORMAL HIGH (ref 0–149)
VLDL Cholesterol Cal: 28 mg/dL (ref 5–40)

## 2023-08-31 LAB — CMP14+EGFR
ALT: 36 IU/L — ABNORMAL HIGH (ref 0–32)
AST: 21 IU/L (ref 0–40)
Albumin: 4.3 g/dL (ref 3.9–4.9)
Alkaline Phosphatase: 92 IU/L (ref 44–121)
BUN/Creatinine Ratio: 18 (ref 12–28)
BUN: 16 mg/dL (ref 8–27)
Bilirubin Total: 0.2 mg/dL (ref 0.0–1.2)
CO2: 23 mmol/L (ref 20–29)
Calcium: 9.5 mg/dL (ref 8.7–10.3)
Chloride: 104 mmol/L (ref 96–106)
Creatinine, Ser: 0.89 mg/dL (ref 0.57–1.00)
Globulin, Total: 1.9 g/dL (ref 1.5–4.5)
Glucose: 82 mg/dL (ref 70–99)
Potassium: 4.4 mmol/L (ref 3.5–5.2)
Sodium: 141 mmol/L (ref 134–144)
Total Protein: 6.2 g/dL (ref 6.0–8.5)
eGFR: 74 mL/min/{1.73_m2} (ref >59)

## 2023-08-31 LAB — THYROID PANEL WITH TSH
Free Thyroxine Index: 2.6 (ref 1.2–4.9)
T3 Uptake Ratio: 30 % (ref 24–39)
T4, Total: 8.7 ug/dL (ref 4.5–12.0)
TSH: 1.01 u[IU]/mL (ref 0.450–4.500)

## 2023-08-31 LAB — LAMOTRIGINE LEVEL: Lamotrigine Lvl: 4.1 ug/mL (ref 2.0–20.0)

## 2023-08-31 LAB — HIV ANTIBODY (ROUTINE TESTING W REFLEX)

## 2023-08-31 LAB — VITAMIN B12: Vitamin B-12: 560 pg/mL (ref 232–1245)

## 2023-08-31 LAB — VITAMIN D 25 HYDROXY (VIT D DEFICIENCY, FRACTURES): Vit D, 25-Hydroxy: 35 ng/mL (ref 30.0–100.0)

## 2023-08-31 LAB — HEPATITIS C ANTIBODY

## 2023-09-05 DIAGNOSIS — L501 Idiopathic urticaria: Secondary | ICD-10-CM | POA: Diagnosis not present

## 2023-09-05 DIAGNOSIS — J310 Chronic rhinitis: Secondary | ICD-10-CM | POA: Diagnosis not present

## 2023-09-05 DIAGNOSIS — L209 Atopic dermatitis, unspecified: Secondary | ICD-10-CM | POA: Diagnosis not present

## 2023-09-05 DIAGNOSIS — L235 Allergic contact dermatitis due to other chemical products: Secondary | ICD-10-CM | POA: Diagnosis not present

## 2023-09-05 DIAGNOSIS — J45990 Exercise induced bronchospasm: Secondary | ICD-10-CM | POA: Diagnosis not present

## 2023-09-07 ENCOUNTER — Other Ambulatory Visit (HOSPITAL_BASED_OUTPATIENT_CLINIC_OR_DEPARTMENT_OTHER): Payer: Self-pay

## 2023-09-07 MED ORDER — LEVALBUTEROL TARTRATE 45 MCG/ACT IN AERO
2.0000 | INHALATION_SPRAY | RESPIRATORY_TRACT | 2 refills | Status: DC
  Filled 2023-09-07: qty 15, 30d supply, fill #0

## 2023-09-12 ENCOUNTER — Other Ambulatory Visit (HOSPITAL_BASED_OUTPATIENT_CLINIC_OR_DEPARTMENT_OTHER): Payer: Self-pay

## 2023-09-12 ENCOUNTER — Encounter (HOSPITAL_BASED_OUTPATIENT_CLINIC_OR_DEPARTMENT_OTHER): Payer: Self-pay

## 2023-09-12 MED ORDER — ALBUTEROL SULFATE HFA 108 (90 BASE) MCG/ACT IN AERS
2.0000 | INHALATION_SPRAY | RESPIRATORY_TRACT | 3 refills | Status: DC
  Filled 2023-09-12: qty 6.7, 25d supply, fill #0

## 2023-09-13 ENCOUNTER — Encounter: Payer: Self-pay | Admitting: Family Medicine

## 2023-09-18 ENCOUNTER — Other Ambulatory Visit (HOSPITAL_BASED_OUTPATIENT_CLINIC_OR_DEPARTMENT_OTHER): Payer: Self-pay

## 2023-09-25 DIAGNOSIS — R29898 Other symptoms and signs involving the musculoskeletal system: Secondary | ICD-10-CM | POA: Diagnosis not present

## 2023-09-26 ENCOUNTER — Encounter: Payer: Self-pay | Admitting: Family Medicine

## 2023-10-20 ENCOUNTER — Ambulatory Visit: Admitting: Family Medicine

## 2023-10-20 ENCOUNTER — Other Ambulatory Visit (HOSPITAL_BASED_OUTPATIENT_CLINIC_OR_DEPARTMENT_OTHER): Payer: Self-pay

## 2023-10-20 ENCOUNTER — Ambulatory Visit: Payer: Self-pay

## 2023-10-20 ENCOUNTER — Encounter: Payer: Self-pay | Admitting: Family Medicine

## 2023-10-20 VITALS — BP 127/83 | HR 88 | Temp 97.5°F | Ht 61.0 in | Wt 156.8 lb

## 2023-10-20 DIAGNOSIS — S80812A Abrasion, left lower leg, initial encounter: Secondary | ICD-10-CM | POA: Diagnosis not present

## 2023-10-20 DIAGNOSIS — W5503XA Scratched by cat, initial encounter: Secondary | ICD-10-CM | POA: Diagnosis not present

## 2023-10-20 DIAGNOSIS — Z8742 Personal history of other diseases of the female genital tract: Secondary | ICD-10-CM

## 2023-10-20 MED ORDER — AZITHROMYCIN 250 MG PO TABS: 250.0000 mg | ORAL_TABLET | Freq: Every day | ORAL | 0 refills | Status: DC

## 2023-10-20 MED ORDER — FLUCONAZOLE 150 MG PO TABS: 150.0000 mg | ORAL_TABLET | Freq: Once | ORAL | 0 refills | Status: DC

## 2023-10-20 MED ORDER — AZITHROMYCIN 250 MG PO TABS
ORAL_TABLET | ORAL | 0 refills | Status: AC
  Filled 2023-10-20 (×2): qty 6, 5d supply, fill #0

## 2023-10-20 MED ORDER — FLUCONAZOLE 150 MG PO TABS
150.0000 mg | ORAL_TABLET | Freq: Once | ORAL | 0 refills | Status: AC
  Filled 2023-10-20: qty 1, 1d supply, fill #0

## 2023-10-20 NOTE — Progress Notes (Signed)
 Subjective:  Patient ID: Kathleen Soto, female    DOB: 08-28-1960, 63 y.o.   MRN: [id number]  Patient Care Team: Galvin Jules, FNP as PCP - General (Family Medicine)   Chief Complaint:  Animal Bite (cat)   HPI: Kathleen Soto is a 63 y.o. female presenting on 10/20/2023 for Animal Bite (cat)   Kathleen Soto "Kathleen Soto" is a 63 year old female who presents with a cat scratch and possible bite wound.  She was scratched and possibly bitten by her cat last month. The scratch is on her skin, with one area that 'bubbled up a little,' suspected to be from a tooth. She showered immediately after the incident but did not use specialized soap.  Her tetanus vaccination is up to date. The cat involved is her own, and its rabies vaccinations are current. The incident occurred when the cat was agitated by a dog and was 'high on catnip.'  No significant symptoms such as fever or swollen lymph nodes. She is allergic to sulfa drugs and sometimes experiences yeast infections from antibiotics.      Relevant past medical, surgical, family, and social history reviewed and updated as indicated.  Allergies and medications reviewed and updated. Data reviewed: Chart in Epic.   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   Depression    GERD (gastroesophageal reflux disease)    Hives    Hyperlipidemia    Hypothyroidism    Mitral valve prolapse    Osteoporosis    Osteoporosis    Thyroid  disease    Urticaria, chronic     Past Surgical History:  Procedure Laterality Date   ABDOMINAL HYSTERECTOMY     ANAL FISSURE REPAIR     BREAST BIOPSY     CESAREAN SECTION     EXPLORATORY LAPAROTOMY     LIPOMA EXCISION N/A 06/26/2023   Procedure: CHEST WALL LIPOMA EXCISION;  Surgeon: Enid Harry, MD;  Location: [facility];  Service: General;  Laterality: N/A;  GENERAL & LMA    TONSILLECTOMY      Social History   Socioeconomic History   Marital status: Married    Spouse name: Not on file   Number of children: 2   Years of education: Not on file   Highest education level: Not on file  Occupational History   Not on file  Tobacco Use   Smoking status: Never   Smokeless tobacco: Never  Vaping Use   Vaping status: Never Used  Substance and Sexual Activity   Alcohol use: Yes    Comment: rarely   Drug use: No   Sexual activity: Not on file  Other Topics Concern   Not on file  Social History Narrative   Not on file   Social Drivers of Health   Financial Resource Strain: Low Risk  (09/25/2023)   Received from [facility]   Overall Financial Resource Strain (CARDIA)    Difficulty of Paying Living Expenses: Not hard at all  Food Insecurity: No Food Insecurity (09/25/2023)   Received from [facility]   Hunger Vital Sign    Worried About Running Out of Food in the Last Year: Never true    Ran Out of Food in the Last Year: Never true  Transportation Needs: No Transportation Needs (09/25/2023)   Received from [facility] - Transportation    Lack of  Transportation (Medical): No    Lack of Transportation (Non-Medical): No  Physical Activity: Insufficiently Active (09/28/2022)   Received from [facility]   Exercise Vital Sign    Days of Exercise per Week: 2 days    Minutes of Exercise per Session: 20 min  Stress: No Stress Concern Present (09/28/2022)   Received from [facility] - Occupational Stress Questionnaire    Feeling of Stress : Only a little  Social Connections: Socially Integrated (09/28/2022)   Received from [facility]   Social Network    How would you rate your social network (family, work, friends)?: Good participation with social networks  Intimate Partner Violence: Not At Risk (09/28/2022)   Received from [facility]   HITS    Over the last 12 months how often did your  partner physically hurt you?: Never    Over the last 12 months how often did your partner insult you or talk down to you?: Sometimes    Over the last 12 months how often did your partner threaten you with physical harm?: Never    Over the last 12 months how often did your partner scream or curse at you?: Sometimes    Outpatient Encounter Medications as of 10/20/2023  Medication Sig   albuterol  (PROVENTIL  HFA) 108 (90 Base) MCG/ACT inhaler Inhale 2 puffs into the lungs every 4-6 hours as needed for cough, wheezing, and/or shortness of breath.   atomoxetine  (STRATTERA ) 80 MG capsule Take 1 capsule (80 mg total) by mouth daily.   buPROPion  (WELLBUTRIN  XL) 300 MG 24 hr tablet Take 1 tablet (300 mg total) by mouth every morning.   clonazePAM  (KLONOPIN ) 0.5 MG tablet Take 1 tablet (0.5 mg total) by mouth 2 (two) times daily as needed.   dicyclomine (BENTYL) 10 MG capsule Take 10 mg by mouth as needed for spasms.   dimenhyDRINATE (DRAMAMINE) 50 MG tablet Take 50 mg by mouth every 8 (eight) hours as needed.   docusate sodium (COLACE) 50 MG capsule Take 50 mg by mouth 2 (two) times daily.   ezetimibe  (ZETIA ) 10 MG tablet Take 1 tablet (10 mg total) by mouth daily.   Famotidine (PEPCID PO) Take by mouth daily at 12 noon.   fluticasone  (FLONASE ) 50 MCG/ACT nasal spray Place 2 sprays into both nostrils daily.   Lactobacillus (REPHRESH PRO-B) CAPS Take by mouth.   levalbuterol  (XOPENEX  HFA) 45 MCG/ACT inhaler Inhale 2 puffs by mouth into the lungs every 4 (four) hours for cough, wheezing, shortness of breath, chest tightness.   levocetirizine (XYZAL) 5 MG tablet Take 5 mg by mouth every evening.   Levothyroxine Sodium 100 MCG CAPS Take 100 mcg by mouth daily.    loratadine (CLARITIN) 10 MG tablet Take 10 mg by mouth daily.   montelukast (SINGULAIR) 10 MG tablet Take 10 mg by mouth at bedtime.   Omalizumab (XOLAIR [place]) Inject into the skin.   ondansetron  (ZOFRAN ) 4 MG tablet Take by mouth at bedtime.    pantoprazole  (PROTONIX ) 40 MG tablet Take 1 tablet (40 mg total) by mouth daily.   [DISCONTINUED] azithromycin  (ZITHROMAX  Z-PAK) 250 MG tablet Take 1 tablet (250 mg total) by mouth daily for 5 days. As directed   [DISCONTINUED] fluconazole  (DIFLUCAN ) 150 MG tablet Take 1 tablet (150 mg total) by mouth once for 1 dose.   azithromycin  (ZITHROMAX  Z-PAK) 250 MG tablet Take 1 tablet (250 mg total) by mouth daily for 5 days. As directed   fluconazole  (DIFLUCAN )  150 MG tablet Take 1 tablet (150 mg total) by mouth once for 1 dose.   lamoTRIgine  (LAMICTAL ) 150 MG tablet Take 1 tablet (150 mg total) by mouth daily.   No facility-administered encounter medications on file as of 10/20/2023.    Allergies  Allergen Reactions   Prednisone      Esophageal spasms   Sulfonamide Derivatives Other (See Comments)    Mouth sores   Metoclopramide Other (See Comments)    Reports adverse reaction (cognitive side effects) to IV Reglan  Pt states she gets severe confusion    Pertinent ROS per HPI, otherwise unremarkable      Objective:  BP 127/83   Pulse 88   Temp (!) 97.5 F (36.4 C)   Ht 5\' 1"  (1.549 m)   Wt 156 lb 12.8 oz (71.1 kg)   SpO2 100%   BMI 29.63 kg/m    Wt Readings from Last 3 Encounters:  10/20/23 156 lb 12.8 oz (71.1 kg)  08/30/23 153 lb 3.2 oz (69.5 kg)  06/26/23 147 lb 11.3 oz (67 kg)    Physical Exam Vitals and nursing note reviewed.  Constitutional:      General: She is not in acute distress.    Appearance: Normal appearance. She is not ill-appearing, toxic-appearing or diaphoretic.  HENT:     Head: Normocephalic and atraumatic.     Mouth/Throat:     Mouth: Mucous membranes are moist.  Eyes:     Pupils: Pupils are equal, round, and reactive to light.  Cardiovascular:     Rate and Rhythm: Normal rate and regular rhythm.     Heart sounds: Normal heart sounds.  Skin:    General: Skin is warm and dry.     Capillary Refill: Capillary refill takes less than 2 seconds.        Neurological:     General: No focal deficit present.     Mental Status: She is alert and oriented to person, place, and time.  Psychiatric:        Mood and Affect: Mood normal.        Behavior: Behavior normal.        Thought Content: Thought content normal.        Judgment: Judgment normal.     Results for orders placed or performed in visit on 09/13/23  HM MAMMOGRAPHY   Collection Time: 01/23/23 12:00 AM  Result Value Ref Range   HM Mammogram 0-4 Bi-Rad 0-4 Bi-Rad, Self Reported Normal       Pertinent labs & imaging results that were available during my care of the patient were reviewed by me and considered in my medical decision making.  Assessment & Plan:  Kathleen Soto "Kathleen Soto" was seen today for animal bite.  Diagnoses and all orders for this visit:  Cat scratch of lower leg, left, initial encounter -     azithromycin  (ZITHROMAX  Z-PAK) 250 MG tablet; Take 1 tablet (250 mg total) by mouth daily for 5 days. As directed  History of vaginitis -     fluconazole  (DIFLUCAN ) 150 MG tablet; Take 1 tablet (150 mg total) by mouth once for 1 dose.      Cat bite wound Sustained a cat bite wound approximately one month ago, likely a puncture wound from a cat's tooth. No evidence of a tooth remaining in the wound. No current signs of infection such as swelling or lymphadenopathy. At risk for infection and cat scratch fever, but these have not developed. Tetanus vaccination is up to date,  reducing the risk of tetanus infection. - Prescribe azithromycin  250 mg once daily for 5 days due to sulfa drug allergy - Send prescription for Diflucan  as a precaution for potential yeast infection - Advise to monitor for symptoms such as fever or lymphadenopathy and report if she occurs  Sulfa drug allergy Allergy to sulfa drugs necessitates the use of azithromycin  as an alternative antibiotic for the cat bite wound.  General Health Maintenance Tetanus vaccination is up to date, which is important  for wound management and prevention of tetanus infection.        Continue all other maintenance medications.  Follow up plan: Return if symptoms worsen or fail to improve.   Continue healthy lifestyle choices, including diet (rich in fruits, vegetables, and lean proteins, and low in salt and simple carbohydrates) and exercise (at least 30 minutes of moderate physical activity daily).  Educational handout given for wound care  The above assessment and management plan was discussed with the patient. The patient verbalized understanding of and has agreed to the management plan. Patient is aware to call the clinic if they develop any new symptoms or if symptoms persist or worsen. Patient is aware when to return to the clinic for a follow-up visit. Patient educated on when it is appropriate to go to the emergency department.   Kattie Parrot, FNP-C [facility] [phone]

## 2023-10-20 NOTE — Telephone Encounter (Signed)
 FYI Only or Action Required?: FYI only for provider  Patient was last seen in primary care on 08/30/2023 by Galvin Jules, FNP. Called Nurse Triage reporting Animal Bite. Symptoms began yesterday. Interventions attempted: Other: washed in shower. Symptoms are: unchanged.  Triage Disposition: See Today in Office (overriding See HCP Within 4 Hours (Or PCP Triage))  Patient/caregiver understands and will follow disposition?: Yes

## 2023-10-20 NOTE — Telephone Encounter (Signed)
            Copied from CRM [id number]. Topic: Clinical - Red Word Triage >> Oct 20, 2023  8:04 AM Baldemar Lev wrote: Red Word that prompted transfer to Nurse Triage: Cat scratches and bite Reason for Disposition  [1] Puncture wound (hole through the skin) AND [2] from a cat bite (or deep claw puncture wound)  Answer Assessment - Initial Assessment Questions 1. ANIMAL: "What type of animal caused the bite?" "Is the injury from a bite or a claw?" If the animal is a dog or a cat, ask: "Was it a pet or a stray?" "Was it acting ill or behaving strangely?"     Cat- cat nip ? Startled  2. LOCATION: "Where is the bite located?"      Left calf, front of left leg, medial aspect of left calf scratch 3. SIZE: "How big is the bite?" "What does it look like?"      Very small  4. ONSET: "When did the bite happen?" (Minutes or hours ago)      2200 last night  5. CIRCUMSTANCES: "Tell me how this happened."      Was putting clothes in washer and cat rushed owner thinks was being chased by the other dogs may be under the influence of cat nip 6. TETANUS: "When was your last tetanus booster?"     unsure 7. RABIES VACCINE: For dog or cat bites, ask: "Do you know if the pet is vaccinated against rabies?"  (e.g., yes, no, overdue for rabies shot, unknown)  yes  Protocols used: Animal Bite-A-AH

## 2023-11-10 ENCOUNTER — Other Ambulatory Visit (HOSPITAL_BASED_OUTPATIENT_CLINIC_OR_DEPARTMENT_OTHER): Payer: Self-pay

## 2023-11-28 DIAGNOSIS — L509 Urticaria, unspecified: Secondary | ICD-10-CM | POA: Diagnosis not present

## 2023-11-28 DIAGNOSIS — L821 Other seborrheic keratosis: Secondary | ICD-10-CM | POA: Diagnosis not present

## 2023-12-01 ENCOUNTER — Other Ambulatory Visit (HOSPITAL_BASED_OUTPATIENT_CLINIC_OR_DEPARTMENT_OTHER): Payer: Self-pay

## 2023-12-05 DIAGNOSIS — Z630 Problems in relationship with spouse or partner: Secondary | ICD-10-CM | POA: Diagnosis not present

## 2023-12-05 DIAGNOSIS — F900 Attention-deficit hyperactivity disorder, predominantly inattentive type: Secondary | ICD-10-CM | POA: Diagnosis not present

## 2023-12-05 DIAGNOSIS — M47812 Spondylosis without myelopathy or radiculopathy, cervical region: Secondary | ICD-10-CM | POA: Diagnosis not present

## 2023-12-05 DIAGNOSIS — R29898 Other symptoms and signs involving the musculoskeletal system: Secondary | ICD-10-CM | POA: Diagnosis not present

## 2023-12-07 DIAGNOSIS — F900 Attention-deficit hyperactivity disorder, predominantly inattentive type: Secondary | ICD-10-CM | POA: Diagnosis not present

## 2023-12-07 DIAGNOSIS — Z630 Problems in relationship with spouse or partner: Secondary | ICD-10-CM | POA: Diagnosis not present

## 2023-12-11 DIAGNOSIS — L209 Atopic dermatitis, unspecified: Secondary | ICD-10-CM | POA: Diagnosis not present

## 2023-12-11 DIAGNOSIS — J45990 Exercise induced bronchospasm: Secondary | ICD-10-CM | POA: Diagnosis not present

## 2023-12-11 DIAGNOSIS — L501 Idiopathic urticaria: Secondary | ICD-10-CM | POA: Diagnosis not present

## 2023-12-11 DIAGNOSIS — J4520 Mild intermittent asthma, uncomplicated: Secondary | ICD-10-CM | POA: Diagnosis not present

## 2023-12-12 DIAGNOSIS — F900 Attention-deficit hyperactivity disorder, predominantly inattentive type: Secondary | ICD-10-CM | POA: Diagnosis not present

## 2023-12-12 DIAGNOSIS — Z630 Problems in relationship with spouse or partner: Secondary | ICD-10-CM | POA: Diagnosis not present

## 2023-12-19 DIAGNOSIS — F900 Attention-deficit hyperactivity disorder, predominantly inattentive type: Secondary | ICD-10-CM | POA: Diagnosis not present

## 2023-12-19 DIAGNOSIS — Z630 Problems in relationship with spouse or partner: Secondary | ICD-10-CM | POA: Diagnosis not present

## 2023-12-21 DIAGNOSIS — F900 Attention-deficit hyperactivity disorder, predominantly inattentive type: Secondary | ICD-10-CM | POA: Diagnosis not present

## 2023-12-21 DIAGNOSIS — Z630 Problems in relationship with spouse or partner: Secondary | ICD-10-CM | POA: Diagnosis not present

## 2023-12-26 DIAGNOSIS — F900 Attention-deficit hyperactivity disorder, predominantly inattentive type: Secondary | ICD-10-CM | POA: Diagnosis not present

## 2023-12-26 DIAGNOSIS — M542 Cervicalgia: Secondary | ICD-10-CM | POA: Diagnosis not present

## 2023-12-26 DIAGNOSIS — E063 Autoimmune thyroiditis: Secondary | ICD-10-CM | POA: Diagnosis not present

## 2023-12-26 DIAGNOSIS — Z630 Problems in relationship with spouse or partner: Secondary | ICD-10-CM | POA: Diagnosis not present

## 2023-12-27 ENCOUNTER — Ambulatory Visit: Admitting: Family Medicine

## 2023-12-27 VITALS — BP 136/94 | HR 107 | Temp 97.0°F | Ht 61.0 in | Wt 156.0 lb

## 2023-12-27 DIAGNOSIS — E782 Mixed hyperlipidemia: Secondary | ICD-10-CM | POA: Diagnosis not present

## 2023-12-27 DIAGNOSIS — Z1231 Encounter for screening mammogram for malignant neoplasm of breast: Secondary | ICD-10-CM

## 2023-12-27 DIAGNOSIS — N941 Unspecified dyspareunia: Secondary | ICD-10-CM

## 2023-12-27 DIAGNOSIS — E079 Disorder of thyroid, unspecified: Secondary | ICD-10-CM | POA: Diagnosis not present

## 2023-12-27 DIAGNOSIS — Z0000 Encounter for general adult medical examination without abnormal findings: Secondary | ICD-10-CM

## 2023-12-27 DIAGNOSIS — Z0001 Encounter for general adult medical examination with abnormal findings: Secondary | ICD-10-CM | POA: Diagnosis not present

## 2023-12-27 MED ORDER — PANTOPRAZOLE SODIUM 40 MG PO TBEC: 40.0000 mg | DELAYED_RELEASE_TABLET | Freq: Every day | ORAL | 3 refills | Status: AC

## 2023-12-27 MED ORDER — EZETIMIBE 10 MG PO TABS: 10.0000 mg | ORAL_TABLET | Freq: Every day | ORAL | 3 refills | Status: AC

## 2023-12-27 NOTE — Progress Notes (Signed)
 Complete physical exam  Patient: Kathleen Soto   DOB: July 26, 1960   63 y.o. Female  MRN: [id number]  Subjective:    Chief Complaint  Patient presents with   Annual Exam    Kathleen Soto is a 63 y.o. female who presents today for a complete physical exam. She reports consuming a general diet. The patient does not participate in regular exercise at present. She generally feels well. She reports sleeping well. She does not have additional problems to discuss today.    Most recent fall risk assessment:    12/27/2023    3:07 PM  Fall Risk   Falls in the past year? 1  Number falls in past yr: 0  Injury with Fall? 1  Risk for fall due to : History of fall(s)  Follow up Falls evaluation completed     Most recent depression screenings:    12/27/2023    3:07 PM 10/20/2023    3:47 PM  PHQ 2/9 Scores  PHQ - 2 Score 1 0  PHQ- 9 Score 8 0    Vision:Within last year and Dental: No current dental problems  Patient Active Problem List   Diagnosis Date Noted   Essential tremor 08/30/2023   Ataxia 08/30/2023   ADD (attention deficit disorder) 02/23/2023   Idiopathic urticaria 02/23/2023   Thyroid  disease 02/23/2023   Vitamin D  deficiency 02/23/2023   High risk medication use 02/23/2023   IBS (irritable bowel syndrome) 02/23/2023   History of colonic polyps 12/20/2021   Generalized anxiety disorder 03/07/2018   Major depressive disorder, recurrent episode, in full remission (HCC) 03/07/2018   Mixed hyperlipidemia 12/26/2009   OSTEOPENIA 12/26/2009   GERD 12/08/2006   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   Depression    GERD (gastroesophageal reflux disease)    Hives    Hyperlipidemia    Hypothyroidism    Mitral valve prolapse    Osteoporosis    Osteoporosis    Thyroid  disease    Urticaria, chronic    Past Surgical History:  Procedure Laterality Date   ABDOMINAL  HYSTERECTOMY     ANAL FISSURE REPAIR     BREAST BIOPSY     CESAREAN SECTION     EXPLORATORY LAPAROTOMY     LIPOMA EXCISION N/A 06/26/2023   Procedure: CHEST WALL LIPOMA EXCISION;  Surgeon: Ebbie Cough, MD;  Location: [facility];  Service: General;  Laterality: N/A;  GENERAL & LMA   TONSILLECTOMY     Social History   Tobacco Use   Smoking status: Never   Smokeless tobacco: Never  Vaping Use   Vaping status: Never Used  Substance Use Topics   Alcohol use: Yes    Comment: rarely   Drug use: No   Social History   Socioeconomic History   Marital status: Married    Spouse name: Not on file   Number of children: 2   Years of education: Not on file   Highest education level: Bachelor's degree (e.g., BA, AB, BS)  Occupational History   Not on file  Tobacco Use   Smoking status: Never   Smokeless tobacco: Never  Vaping Use   Vaping status: Never Used  Substance and Sexual Activity   Alcohol use: Yes    Comment: rarely   Drug use: No   Sexual activity: Not on file  Other Topics Concern   Not on  file  Social History Narrative   Not on file   Social Drivers of Health   Financial Resource Strain: Low Risk  (12/27/2023)   Overall Financial Resource Strain (CARDIA)    Difficulty of Paying Living Expenses: Not hard at all  Food Insecurity: No Food Insecurity (12/27/2023)   Hunger Vital Sign    Worried About Running Out of Food in the Last Year: Never true    Ran Out of Food in the Last Year: Never true  Transportation Needs: No Transportation Needs (12/27/2023)   PRAPARE - Administrator, Civil Service (Medical): No    Lack of Transportation (Non-Medical): No  Physical Activity: Unknown (12/27/2023)   Exercise Vital Sign    Days of Exercise per Week: Patient declined    Minutes of Exercise per Session: Not on file  Stress: Stress Concern Present (12/27/2023)   [employer] of Occupational Health - Occupational Stress Questionnaire     Feeling of Stress: Rather much  Social Connections: Socially Integrated (12/27/2023)   Social Connection and Isolation Panel    Frequency of Communication with Friends and Family: More than three times a week    Frequency of Social Gatherings with Friends and Family: More than three times a week    Attends Religious Services: More than 4 times per year    Active Member of [employer] or Organizations: Yes    Attends Engineer, structural: More than 4 times per year    Marital Status: Married  Catering manager Violence: Not At Risk (09/28/2022)   Received from [facility]   HITS    Over the last 12 months how often did your partner physically hurt you?: Never    Over the last 12 months how often did your partner insult you or talk down to you?: Sometimes    Over the last 12 months how often did your partner threaten you with physical harm?: Never    Over the last 12 months how often did your partner scream or curse at you?: Sometimes   Family Status  Relation Name Status   Mother  Deceased   Father  Deceased   Sister RA Alive   Sister  Audiological scientist  (Not Specified)   Son 2 Alive   MGM  Deceased   MGF  Deceased   PGM  Deceased   PGF  Deceased  No partnership data on file   Family History  Problem Relation Age of Onset   Parkinson's disease Mother    Thyroid  disease Father    Depression Father    Prostate cancer Father    Leukemia Father    Thyroid  disease Sister    Alcohol abuse Sister    Cancer Brother    Alcohol abuse Paternal Grandfather    Rheum arthritis Paternal Grandfather    Allergies  Allergen Reactions   Prednisone      Esophageal spasms   Sulfonamide Derivatives Other (See Comments)    Mouth sores   Metoclopramide Other (See Comments)    Reports adverse reaction (cognitive side effects) to IV Reglan  Pt states she gets severe confusion      Patient Care Team: Severa Rock HERO, FNP as PCP - General (Family Medicine)   Outpatient Medications Prior  to Visit  Medication Sig   atomoxetine  (STRATTERA ) 80 MG capsule Take 1 capsule (80 mg total) by mouth daily.   buPROPion  (WELLBUTRIN  XL) 300 MG 24 hr tablet Take 1 tablet (300 mg total) by mouth every  morning.   clonazePAM  (KLONOPIN ) 0.5 MG tablet Take 1 tablet (0.5 mg total) by mouth 2 (two) times daily as needed.   dicyclomine (BENTYL) 10 MG capsule Take 10 mg by mouth as needed for spasms.   dimenhyDRINATE (DRAMAMINE) 50 MG tablet Take 50 mg by mouth every 8 (eight) hours as needed.   docusate sodium (COLACE) 50 MG capsule Take 50 mg by mouth 2 (two) times daily.   Famotidine (PEPCID PO) Take by mouth daily at 12 noon.   Lactobacillus (REPHRESH PRO-B) CAPS Take by mouth.   lamoTRIgine  (LAMICTAL ) 150 MG tablet Take 1 tablet (150 mg total) by mouth daily.   levocetirizine (XYZAL) 5 MG tablet Take 5 mg by mouth every evening.   Levothyroxine Sodium 100 MCG CAPS Take 100 mcg by mouth daily.    loratadine (CLARITIN) 10 MG tablet Take 10 mg by mouth daily.   montelukast (SINGULAIR) 10 MG tablet Take 10 mg by mouth at bedtime.   Omalizumab (XOLAIR [place]) Inject into the skin.   ondansetron  (ZOFRAN ) 4 MG tablet Take by mouth at bedtime.   triamcinolone cream (KENALOG) 0.1 % Apply 1 Application topically 2 (two) times daily.   [DISCONTINUED] ezetimibe  (ZETIA ) 10 MG tablet Take 1 tablet (10 mg total) by mouth daily.   [DISCONTINUED] pantoprazole  (PROTONIX ) 40 MG tablet Take 1 tablet (40 mg total) by mouth daily.   [DISCONTINUED] fluticasone  (FLONASE ) 50 MCG/ACT nasal spray Place 2 sprays into both nostrils daily.   [DISCONTINUED] levalbuterol  (XOPENEX  HFA) 45 MCG/ACT inhaler Inhale 2 puffs by mouth into the lungs every 4 (four) hours for cough, wheezing, shortness of breath, chest tightness.   No facility-administered medications prior to visit.    ROS per HPI     Objective:     BP (!) 136/94   Pulse (!) 107   Temp (!) 97 F (36.1 C)   Ht 5' 1 (1.549 m)   Wt 156 lb (70.8 kg)   SpO2 98%    BMI 29.48 kg/m  BP Readings from Last 3 Encounters:  12/27/23 (!) 136/94  10/20/23 127/83  08/30/23 136/88   Wt Readings from Last 3 Encounters:  12/27/23 156 lb (70.8 kg)  10/20/23 156 lb 12.8 oz (71.1 kg)  08/30/23 153 lb 3.2 oz (69.5 kg)   SpO2 Readings from Last 3 Encounters:  12/27/23 98%  10/20/23 100%  08/30/23 98%      Physical Exam Vitals and nursing note reviewed.  Constitutional:      Appearance: Normal appearance. She is well-developed, well-groomed and overweight.  HENT:     Head: Normocephalic and atraumatic.     Right Ear: Tympanic membrane, ear canal and external ear normal.     Left Ear: Tympanic membrane, ear canal and external ear normal.     Nose: Nose normal.     Mouth/Throat:     Mouth: Mucous membranes are moist.     Pharynx: Oropharynx is clear.  Eyes:     Conjunctiva/sclera: Conjunctivae normal.     Pupils: Pupils are equal, round, and reactive to light.  Neck:     Thyroid : No thyroid  mass, thyromegaly or thyroid  tenderness.     Trachea: Trachea and phonation normal.  Cardiovascular:     Rate and Rhythm: Normal rate and regular rhythm.     Heart sounds: Normal heart sounds. No murmur heard.    No friction rub. No gallop.  Pulmonary:     Effort: Pulmonary effort is normal.     Breath sounds: Normal breath  sounds.  Musculoskeletal:     Cervical back: Normal range of motion and neck supple.     Right lower leg: No edema.     Left lower leg: No edema.  Lymphadenopathy:     Cervical: No cervical adenopathy.  Skin:    General: Skin is warm and dry.     Capillary Refill: Capillary refill takes less than 2 seconds.  Neurological:     General: No focal deficit present.     Mental Status: She is alert and oriented to person, place, and time.  Psychiatric:        Mood and Affect: Mood normal.        Behavior: Behavior normal. Behavior is cooperative.        Thought Content: Thought content normal.        Judgment: Judgment normal.       Last CBC Lab Results  Component Value Date   WBC 4.8 08/30/2023   HGB 14.4 08/30/2023   HCT 43.8 08/30/2023   MCV 89 08/30/2023   MCH 29.3 08/30/2023   RDW 12.5 08/30/2023   PLT 224 08/30/2023   Last metabolic panel Lab Results  Component Value Date   GLUCOSE 82 08/30/2023   NA 141 08/30/2023   K 4.4 08/30/2023   CL 104 08/30/2023   CO2 23 08/30/2023   BUN 16 08/30/2023   CREATININE 0.89 08/30/2023   EGFR 74 08/30/2023   CALCIUM 9.5 08/30/2023   PROT 6.2 08/30/2023   ALBUMIN 4.3 08/30/2023   LABGLOB 1.9 08/30/2023   BILITOT 0.2 08/30/2023   ALKPHOS 92 08/30/2023   AST 21 08/30/2023   ALT 36 (H) 08/30/2023   Last lipids Lab Results  Component Value Date   CHOL 190 08/30/2023   HDL 55 08/30/2023   LDLCALC 107 (H) 08/30/2023   TRIG 162 (H) 08/30/2023   CHOLHDL 3.5 08/30/2023   Last hemoglobin A1c Lab Results  Component Value Date   HGBA1C 5.4 08/30/2023   Last thyroid  functions Lab Results  Component Value Date   TSH 1.010 08/30/2023   T4TOTAL 8.7 08/30/2023   Last vitamin D  Lab Results  Component Value Date   VD25OH 35.0 08/30/2023   Last vitamin B12 and Folate Lab Results  Component Value Date   VITAMINB12 560 08/30/2023        Assessment & Plan:    Routine Health Maintenance and Physical Exam  Immunization History  Administered Date(s) Administered   Fluzone Influenza virus vaccine,trivalent (IIV3), split virus 04/13/2007, 04/09/2008, 03/26/2009   Influenza Inj Mdck Quad Pf 02/25/2021, 03/28/2022   Influenza, Quadrivalent, Recombinant, Inj, Pf 04/25/2020   Influenza, Seasonal, Injecte, Preservative Fre 03/24/2011, 03/22/2012   Influenza,inj,Quad PF,6+ Mos 02/06/2014, 03/13/2015, 01/27/2017, 02/02/2018   Influenza,trivalent, recombinat, inj, PF 04/16/2013   Influenza-Unspecified 03/06/2016   Moderna Covid-19 Vaccine Bivalent Booster 74yrs & up 06/01/2020, 06/01/2021   Moderna Sars-Covid-2 Vaccination 07/05/2019, 08/02/2019    PNEUMOCOCCAL CONJUGATE-20 08/30/2023   Td 11/13/2000   Tdap 05/06/2010, 09/15/2020    Health Maintenance  Topic Date Due   COVID-19 Vaccine (5 - 2024-25 season) 01/12/2024 (Originally 01/15/2023)   Zoster Vaccines- Shingrix (1 of 2) 03/28/2024 (Originally 04/03/1981)   INFLUENZA VACCINE  08/13/2024 (Originally 12/15/2023)   MAMMOGRAM  01/22/2025   DTaP/Tdap/Td (4 - Td or Tdap) 09/16/2030   Colonoscopy  12/10/2031   Pneumococcal Vaccine: 19-49 Years  Completed   Pneumococcal Vaccine: 50+ Years  Completed   Hepatitis C Screening  Completed   HIV Screening  Completed  Hepatitis B Vaccines  Aged Out   HPV VACCINES  Aged Out   Meningococcal B Vaccine  Aged Out    Discussed health benefits of physical activity, and encouraged her to engage in regular exercise appropriate for her age and condition.  Problem List Items Addressed This Visit       Endocrine   Thyroid  disease     Other   Mixed hyperlipidemia   Relevant Medications   ezetimibe  (ZETIA ) 10 MG tablet   Other Relevant Orders   CMP14+EGFR   Other Visit Diagnoses       Annual physical exam    -  Primary   Relevant Orders   CBC with Differential/Platelet   CMP14+EGFR   Lipid panel   MM 3D SCREENING MAMMOGRAM BILATERAL BREAST     Encounter for screening mammogram for malignant neoplasm of breast       Relevant Orders   MM 3D SCREENING MAMMOGRAM BILATERAL BREAST      Return in about 1 year (around 12/26/2024) for Annual Physical.     Rosaline Bruns, FNP

## 2023-12-28 ENCOUNTER — Ambulatory Visit: Payer: Self-pay | Admitting: Family Medicine

## 2023-12-28 ENCOUNTER — Telehealth: Payer: Self-pay

## 2023-12-28 DIAGNOSIS — F900 Attention-deficit hyperactivity disorder, predominantly inattentive type: Secondary | ICD-10-CM | POA: Diagnosis not present

## 2023-12-28 DIAGNOSIS — Z630 Problems in relationship with spouse or partner: Secondary | ICD-10-CM | POA: Diagnosis not present

## 2023-12-28 LAB — CBC WITH DIFFERENTIAL/PLATELET
Basophils Absolute: 0.1 x10E3/uL (ref 0.0–0.2)
Basos: 1 %
EOS (ABSOLUTE): 0.1 x10E3/uL (ref 0.0–0.4)
Eos: 2 %
Hematocrit: 46.8 % — ABNORMAL HIGH (ref 34.0–46.6)
Hemoglobin: 14.6 g/dL (ref 11.1–15.9)
Immature Grans (Abs): 0 x10E3/uL (ref 0.0–0.1)
Immature Granulocytes: 0 %
Lymphocytes Absolute: 1.8 x10E3/uL (ref 0.7–3.1)
Lymphs: 31 %
MCH: 28.4 pg (ref 26.6–33.0)
MCHC: 31.2 g/dL — ABNORMAL LOW (ref 31.5–35.7)
MCV: 91 fL (ref 79–97)
Monocytes Absolute: 0.6 x10E3/uL (ref 0.1–0.9)
Monocytes: 10 %
Neutrophils Absolute: 3.2 x10E3/uL (ref 1.4–7.0)
Neutrophils: 56 %
Platelets: 233 x10E3/uL (ref 150–450)
RBC: 5.14 x10E6/uL (ref 3.77–5.28)
RDW: 12.8 % (ref 11.7–15.4)
WBC: 5.8 x10E3/uL (ref 3.4–10.8)

## 2023-12-28 LAB — CMP14+EGFR
ALT: 30 IU/L (ref 0–32)
AST: 18 IU/L (ref 0–40)
Albumin: 4.3 g/dL (ref 3.9–4.9)
Alkaline Phosphatase: 82 IU/L (ref 44–121)
BUN/Creatinine Ratio: 17 (ref 12–28)
BUN: 14 mg/dL (ref 8–27)
Bilirubin Total: <0.2 mg/dL (ref 0.0–1.2)
CO2: 22 mmol/L (ref 20–29)
Calcium: 9.5 mg/dL (ref 8.7–10.3)
Chloride: 105 mmol/L (ref 96–106)
Creatinine, Ser: 0.83 mg/dL (ref 0.57–1.00)
Globulin, Total: 1.9 g/dL (ref 1.5–4.5)
Glucose: 97 mg/dL (ref 70–99)
Potassium: 4.2 mmol/L (ref 3.5–5.2)
Sodium: 143 mmol/L (ref 134–144)
Total Protein: 6.2 g/dL (ref 6.0–8.5)
eGFR: 80 mL/min/1.73 (ref >59)

## 2023-12-28 LAB — LIPID PANEL
Chol/HDL Ratio: 4.6 ratio — ABNORMAL HIGH (ref 0.0–4.4)
Cholesterol, Total: 195 mg/dL (ref 100–199)
HDL: 42 mg/dL (ref >39)
LDL Chol Calc (NIH): 86 mg/dL (ref 0–99)
Triglycerides: 415 mg/dL — ABNORMAL HIGH (ref 0–149)
VLDL Cholesterol Cal: 67 mg/dL — ABNORMAL HIGH (ref 5–40)

## 2023-12-28 NOTE — Telephone Encounter (Signed)
 Result encounter updated

## 2023-12-28 NOTE — Telephone Encounter (Signed)
 Copied from CRM [id number]. Topic: Clinical - Lab/Test Results >> Dec 28, 2023 11:36 AM Travis F wrote: Reason for CRM: Patient is returning a call regarding her lab results. Patient had viewed the results in her chart. Patient says she had a frosted lemonade and a cookie before her labs yesterday and that might be why they were so high. Patient says she will control her eating and if there is anything the provider recommends she requests she give her a call.

## 2024-01-02 ENCOUNTER — Other Ambulatory Visit: Payer: Self-pay

## 2024-01-02 DIAGNOSIS — Z630 Problems in relationship with spouse or partner: Secondary | ICD-10-CM | POA: Diagnosis not present

## 2024-01-02 DIAGNOSIS — F900 Attention-deficit hyperactivity disorder, predominantly inattentive type: Secondary | ICD-10-CM | POA: Diagnosis not present

## 2024-01-04 DIAGNOSIS — Z630 Problems in relationship with spouse or partner: Secondary | ICD-10-CM | POA: Diagnosis not present

## 2024-01-04 DIAGNOSIS — F900 Attention-deficit hyperactivity disorder, predominantly inattentive type: Secondary | ICD-10-CM | POA: Diagnosis not present

## 2024-01-16 ENCOUNTER — Encounter: Payer: Self-pay | Admitting: Family

## 2024-01-16 ENCOUNTER — Other Ambulatory Visit (HOSPITAL_BASED_OUTPATIENT_CLINIC_OR_DEPARTMENT_OTHER): Payer: Self-pay

## 2024-01-16 ENCOUNTER — Ambulatory Visit: Admitting: Family

## 2024-01-16 VITALS — BP 121/86 | HR 108 | Temp 96.5°F | Ht 61.0 in | Wt 151.4 lb

## 2024-01-16 DIAGNOSIS — A084 Viral intestinal infection, unspecified: Secondary | ICD-10-CM

## 2024-01-16 MED ORDER — ONDANSETRON 4 MG PO TBDP
4.0000 mg | ORAL_TABLET | Freq: Three times a day (TID) | ORAL | 0 refills | Status: DC | PRN
  Filled 2024-01-16: qty 20, 7d supply, fill #0

## 2024-01-16 MED ORDER — PROMETHAZINE HCL 25 MG RE SUPP
25.0000 mg | Freq: Four times a day (QID) | RECTAL | 0 refills | Status: AC | PRN
  Filled 2024-01-16: qty 12, 3d supply, fill #0

## 2024-01-16 NOTE — Progress Notes (Signed)
 Subjective:    Patient ID: Kathleen Soto, female    DOB: 1960-07-05, 63 y.o.   MRN: [id number]  Chief Complaint  Patient presents with   Nausea   Emesis   Diarrhea   Pt presents to the office today with nausea, vomiting, and diarrhea that started yesterday.  [place] was [place] and came back 01/13/24.  Emesis  This is a new problem. The current episode started yesterday. The problem occurs 5 to 10 times per day. The problem has been resolved. The emesis has an appearance of stomach contents. Associated symptoms include diarrhea and dizziness. Pertinent negatives include no chills (improved) or fever. Risk factors: zofran . She has tried bed rest, diet change and increased fluids for the symptoms. The treatment provided mild relief.  Diarrhea  This is a new problem. The current episode started yesterday. The problem occurs 5 to 10 times per day. The problem has been resolved. The stool consistency is described as Watery. Associated symptoms include vomiting. Pertinent negatives include no chills (improved) or fever. She has tried nothing for the symptoms. The treatment provided moderate relief.      Review of Systems  Constitutional:  Negative for chills (improved) and fever.  Gastrointestinal:  Positive for diarrhea and vomiting.  Neurological:  Positive for dizziness.  All other systems reviewed and are negative.   Social History   Socioeconomic History   Marital status: Married    Spouse name: Not on file   Number of children: 2   Years of education: Not on file   Highest education level: Bachelor's degree (e.g., BA, AB, BS)  Occupational History   Not on file  Tobacco Use   Smoking status: Never   Smokeless tobacco: Never  Vaping Use   Vaping status: Never Used  Substance and Sexual Activity   Alcohol use: Yes    Comment: rarely   Drug use: No   Sexual activity: Not on file  Other Topics Concern   Not on file  Social History Narrative   Not on file   Social  Drivers of Health   Financial Resource Strain: Low Risk  (12/27/2023)   Overall Financial Resource Strain (CARDIA)    Difficulty of Paying Living Expenses: Not hard at all  Food Insecurity: No Food Insecurity (12/27/2023)   Hunger Vital Sign    Worried About Running Out of Food in the Last Year: Never true    Ran Out of Food in the Last Year: Never true  Transportation Needs: No Transportation Needs (12/27/2023)   PRAPARE - Administrator, Civil Service (Medical): No    Lack of Transportation (Non-Medical): No  Physical Activity: Unknown (12/27/2023)   Exercise Vital Sign    Days of Exercise per Week: Patient declined    Minutes of Exercise per Session: Not on file  Stress: Stress Concern Present (12/27/2023)   [employer] of Occupational Health - Occupational Stress Questionnaire    Feeling of Stress: Rather much  Social Connections: Socially Integrated (12/27/2023)   Social Connection and Isolation Panel    Frequency of Communication with Friends and Family: More than three times a week    Frequency of Social Gatherings with Friends and Family: More than three times a week    Attends Religious Services: More than 4 times per year    Active Member of [employer] or Organizations: Yes    Attends Engineer, structural: More than 4 times per year    Marital Status: Married   Family  History  Problem Relation Age of Onset   Parkinson's disease Mother    Thyroid  disease Father    Depression Father    Prostate cancer Father    Leukemia Father    Thyroid  disease Sister    Alcohol abuse Sister    Cancer Brother    Alcohol abuse Paternal Grandfather    Rheum arthritis Paternal Grandfather         Objective:   Physical Exam Vitals reviewed.  Constitutional:      General: She is not in acute distress.    Appearance: She is well-developed.  HENT:     Head: Normocephalic and atraumatic.     Right Ear: Tympanic membrane normal.     Left Ear: Tympanic membrane  normal.  Eyes:     Pupils: Pupils are equal, round, and reactive to light.  Neck:     Thyroid : No thyromegaly.  Cardiovascular:     Rate and Rhythm: Normal rate and regular rhythm.     Heart sounds: Normal heart sounds. No murmur heard. Pulmonary:     Effort: Pulmonary effort is normal. No respiratory distress.     Breath sounds: Normal breath sounds. No wheezing.  Abdominal:     General: Bowel sounds are normal. There is no distension.     Palpations: Abdomen is soft.     Tenderness: There is no abdominal tenderness.  Musculoskeletal:        General: No tenderness. Normal range of motion.     Cervical back: Normal range of motion and neck supple.  Skin:    General: Skin is warm and dry.  Neurological:     Mental Status: She is alert and oriented to person, place, and time.     Cranial Nerves: No cranial nerve deficit.     Deep Tendon Reflexes: Reflexes are normal and symmetric.  Psychiatric:        Behavior: Behavior normal.        Thought Content: Thought content normal.        Judgment: Judgment normal.       BP 121/86   Pulse (!) 108   Temp (!) 96.5 F (35.8 C) (Temporal)   Ht 5' 1 (1.549 m)   Wt 151 lb 6.4 oz (68.7 kg)   SpO2 94%   BMI 28.61 kg/m      Assessment & Plan:  Kathleen Soto comes in today with chief complaint of Nausea, Emesis, and Diarrhea   Diagnosis and orders addressed:  1. Viral gastroenteritis (Primary) Force fluids Rest Bland diet  Zofran  and phenergan  as needed Good hand hygiene Follow up if symptoms worsen or do not improve  - ondansetron  (ZOFRAN -ODT) 4 MG disintegrating tablet; Take 1 tablet (4 mg total) by mouth every 8 (eight) hours as needed for nausea or vomiting.  Dispense: 20 tablet; Refill: 0 - promethazine  (PHENERGAN ) 25 MG suppository; Place 1 suppository (25 mg total) rectally every 6 (six) hours as needed for nausea or vomiting.  Dispense: 12 each; Refill: 0     Bari Learn, FNP

## 2024-01-16 NOTE — Patient Instructions (Signed)

## 2024-01-17 DIAGNOSIS — F900 Attention-deficit hyperactivity disorder, predominantly inattentive type: Secondary | ICD-10-CM | POA: Diagnosis not present

## 2024-01-17 DIAGNOSIS — Z630 Problems in relationship with spouse or partner: Secondary | ICD-10-CM | POA: Diagnosis not present

## 2024-01-18 ENCOUNTER — Other Ambulatory Visit (HOSPITAL_BASED_OUTPATIENT_CLINIC_OR_DEPARTMENT_OTHER): Payer: Self-pay

## 2024-01-18 DIAGNOSIS — F3342 Major depressive disorder, recurrent, in full remission: Secondary | ICD-10-CM | POA: Diagnosis not present

## 2024-01-18 DIAGNOSIS — F900 Attention-deficit hyperactivity disorder, predominantly inattentive type: Secondary | ICD-10-CM | POA: Diagnosis not present

## 2024-01-18 DIAGNOSIS — F411 Generalized anxiety disorder: Secondary | ICD-10-CM | POA: Diagnosis not present

## 2024-01-18 DIAGNOSIS — G4700 Insomnia, unspecified: Secondary | ICD-10-CM | POA: Diagnosis not present

## 2024-01-18 DIAGNOSIS — F909 Attention-deficit hyperactivity disorder, unspecified type: Secondary | ICD-10-CM | POA: Diagnosis not present

## 2024-01-18 DIAGNOSIS — Z630 Problems in relationship with spouse or partner: Secondary | ICD-10-CM | POA: Diagnosis not present

## 2024-01-18 MED ORDER — CLONAZEPAM 0.5 MG PO TABS
0.5000 mg | ORAL_TABLET | Freq: Two times a day (BID) | ORAL | 5 refills | Status: AC | PRN
  Filled 2024-03-25: qty 60, 30d supply, fill #0
  Filled 2024-05-28 (×2): qty 60, 30d supply, fill #1

## 2024-01-22 ENCOUNTER — Other Ambulatory Visit (HOSPITAL_BASED_OUTPATIENT_CLINIC_OR_DEPARTMENT_OTHER): Payer: Self-pay

## 2024-01-22 DIAGNOSIS — M4802 Spinal stenosis, cervical region: Secondary | ICD-10-CM | POA: Diagnosis not present

## 2024-01-22 DIAGNOSIS — M7918 Myalgia, other site: Secondary | ICD-10-CM | POA: Diagnosis not present

## 2024-01-22 DIAGNOSIS — M47812 Spondylosis without myelopathy or radiculopathy, cervical region: Secondary | ICD-10-CM | POA: Diagnosis not present

## 2024-01-22 MED ORDER — METHOCARBAMOL 500 MG PO TABS
500.0000 mg | ORAL_TABLET | Freq: Two times a day (BID) | ORAL | 1 refills | Status: DC | PRN
  Filled 2024-01-22: qty 60, 30d supply, fill #0

## 2024-01-23 DIAGNOSIS — Z630 Problems in relationship with spouse or partner: Secondary | ICD-10-CM | POA: Diagnosis not present

## 2024-01-23 DIAGNOSIS — F900 Attention-deficit hyperactivity disorder, predominantly inattentive type: Secondary | ICD-10-CM | POA: Diagnosis not present

## 2024-01-25 DIAGNOSIS — Z630 Problems in relationship with spouse or partner: Secondary | ICD-10-CM | POA: Diagnosis not present

## 2024-01-25 DIAGNOSIS — F900 Attention-deficit hyperactivity disorder, predominantly inattentive type: Secondary | ICD-10-CM | POA: Diagnosis not present

## 2024-01-30 DIAGNOSIS — Z630 Problems in relationship with spouse or partner: Secondary | ICD-10-CM | POA: Diagnosis not present

## 2024-01-30 DIAGNOSIS — F900 Attention-deficit hyperactivity disorder, predominantly inattentive type: Secondary | ICD-10-CM | POA: Diagnosis not present

## 2024-02-01 ENCOUNTER — Other Ambulatory Visit (HOSPITAL_BASED_OUTPATIENT_CLINIC_OR_DEPARTMENT_OTHER): Payer: Self-pay

## 2024-02-01 DIAGNOSIS — Z630 Problems in relationship with spouse or partner: Secondary | ICD-10-CM | POA: Diagnosis not present

## 2024-02-01 DIAGNOSIS — F900 Attention-deficit hyperactivity disorder, predominantly inattentive type: Secondary | ICD-10-CM | POA: Diagnosis not present

## 2024-02-05 ENCOUNTER — Encounter: Payer: Self-pay | Admitting: Family Medicine

## 2024-02-05 DIAGNOSIS — F900 Attention-deficit hyperactivity disorder, predominantly inattentive type: Secondary | ICD-10-CM | POA: Diagnosis not present

## 2024-02-05 DIAGNOSIS — M899 Disorder of bone, unspecified: Secondary | ICD-10-CM

## 2024-02-05 DIAGNOSIS — Z630 Problems in relationship with spouse or partner: Secondary | ICD-10-CM | POA: Diagnosis not present

## 2024-02-07 DIAGNOSIS — Z630 Problems in relationship with spouse or partner: Secondary | ICD-10-CM | POA: Diagnosis not present

## 2024-02-07 DIAGNOSIS — F900 Attention-deficit hyperactivity disorder, predominantly inattentive type: Secondary | ICD-10-CM | POA: Diagnosis not present

## 2024-02-08 DIAGNOSIS — Z1330 Encounter for screening examination for mental health and behavioral disorders, unspecified: Secondary | ICD-10-CM | POA: Diagnosis not present

## 2024-02-08 DIAGNOSIS — E063 Autoimmune thyroiditis: Secondary | ICD-10-CM | POA: Diagnosis not present

## 2024-02-12 DIAGNOSIS — M949 Disorder of cartilage, unspecified: Secondary | ICD-10-CM | POA: Diagnosis not present

## 2024-02-12 DIAGNOSIS — R92333 Mammographic heterogeneous density, bilateral breasts: Secondary | ICD-10-CM | POA: Diagnosis not present

## 2024-02-12 DIAGNOSIS — Z1231 Encounter for screening mammogram for malignant neoplasm of breast: Secondary | ICD-10-CM | POA: Diagnosis not present

## 2024-02-12 DIAGNOSIS — M810 Age-related osteoporosis without current pathological fracture: Secondary | ICD-10-CM | POA: Diagnosis not present

## 2024-02-12 DIAGNOSIS — M899 Disorder of bone, unspecified: Secondary | ICD-10-CM | POA: Diagnosis not present

## 2024-02-12 LAB — HM DEXA SCAN

## 2024-02-12 LAB — MM 3D SCREENING MAMMOGRAM BILATERAL BREAST

## 2024-02-13 ENCOUNTER — Encounter: Payer: Self-pay | Admitting: Family Medicine

## 2024-02-13 ENCOUNTER — Ambulatory Visit: Payer: Self-pay | Admitting: Family Medicine

## 2024-02-13 DIAGNOSIS — Z630 Problems in relationship with spouse or partner: Secondary | ICD-10-CM | POA: Diagnosis not present

## 2024-02-13 DIAGNOSIS — M810 Age-related osteoporosis without current pathological fracture: Secondary | ICD-10-CM

## 2024-02-13 DIAGNOSIS — F900 Attention-deficit hyperactivity disorder, predominantly inattentive type: Secondary | ICD-10-CM | POA: Diagnosis not present

## 2024-02-15 DIAGNOSIS — F900 Attention-deficit hyperactivity disorder, predominantly inattentive type: Secondary | ICD-10-CM | POA: Diagnosis not present

## 2024-02-15 DIAGNOSIS — Z630 Problems in relationship with spouse or partner: Secondary | ICD-10-CM | POA: Diagnosis not present

## 2024-02-16 ENCOUNTER — Telehealth: Payer: Self-pay

## 2024-02-16 NOTE — Progress Notes (Signed)
 Care Guide Pharmacy Note  02/16/2024 Name: Kathleen Soto MRN: [id number] DOB: [birth year]  Referred By: Severa Rock CHRISTELLA, FNP Reason for referral: Complex Care Management (Outreach to schedule with pharm d )   Kathleen Soto is a 63 y.o. year old female who is a primary care patient of Severa Rock CHRISTELLA, FNP.  Kathleen Soto was referred to the pharmacist for assistance related to: osteoporosis  Successful contact was made with the patient to discuss pharmacy services including being ready for the pharmacist to call at least 5 minutes before the scheduled appointment time and to have medication bottles and any blood pressure readings ready for review. The patient agreed to meet with the pharmacist via telephone visit on (date/time).03/07/2024  Jeoffrey Buffalo , RMA     [facility]  [facility], [facility] Guide  Direct Dial: [phone]  Website: [email or web address]

## 2024-02-19 DIAGNOSIS — M542 Cervicalgia: Secondary | ICD-10-CM | POA: Diagnosis not present

## 2024-02-19 DIAGNOSIS — I6782 Cerebral ischemia: Secondary | ICD-10-CM | POA: Diagnosis not present

## 2024-02-20 DIAGNOSIS — Z630 Problems in relationship with spouse or partner: Secondary | ICD-10-CM | POA: Diagnosis not present

## 2024-02-20 DIAGNOSIS — F900 Attention-deficit hyperactivity disorder, predominantly inattentive type: Secondary | ICD-10-CM | POA: Diagnosis not present

## 2024-02-22 DIAGNOSIS — F900 Attention-deficit hyperactivity disorder, predominantly inattentive type: Secondary | ICD-10-CM | POA: Diagnosis not present

## 2024-02-22 DIAGNOSIS — Z630 Problems in relationship with spouse or partner: Secondary | ICD-10-CM | POA: Diagnosis not present

## 2024-02-26 ENCOUNTER — Other Ambulatory Visit (HOSPITAL_BASED_OUTPATIENT_CLINIC_OR_DEPARTMENT_OTHER): Payer: Self-pay

## 2024-02-26 DIAGNOSIS — Z01419 Encounter for gynecological examination (general) (routine) without abnormal findings: Secondary | ICD-10-CM | POA: Diagnosis not present

## 2024-02-26 LAB — HM PAP SMEAR

## 2024-02-26 MED ORDER — ESTRADIOL 10 MCG VA TABS
10.0000 ug | ORAL_TABLET | VAGINAL | 3 refills | Status: AC
  Filled 2024-02-26: qty 40, 90d supply, fill #0

## 2024-02-27 ENCOUNTER — Other Ambulatory Visit: Payer: Self-pay

## 2024-02-27 DIAGNOSIS — F900 Attention-deficit hyperactivity disorder, predominantly inattentive type: Secondary | ICD-10-CM | POA: Diagnosis not present

## 2024-02-27 DIAGNOSIS — Z630 Problems in relationship with spouse or partner: Secondary | ICD-10-CM | POA: Diagnosis not present

## 2024-02-28 DIAGNOSIS — F900 Attention-deficit hyperactivity disorder, predominantly inattentive type: Secondary | ICD-10-CM | POA: Diagnosis not present

## 2024-02-28 DIAGNOSIS — Z630 Problems in relationship with spouse or partner: Secondary | ICD-10-CM | POA: Diagnosis not present

## 2024-03-04 ENCOUNTER — Encounter: Payer: Self-pay | Admitting: Family Medicine

## 2024-03-04 ENCOUNTER — Ambulatory Visit: Admitting: Family Medicine

## 2024-03-04 ENCOUNTER — Other Ambulatory Visit (HOSPITAL_BASED_OUTPATIENT_CLINIC_OR_DEPARTMENT_OTHER): Payer: Self-pay

## 2024-03-04 VITALS — BP 142/92 | HR 117 | Temp 96.7°F | Ht 61.0 in | Wt 158.0 lb

## 2024-03-04 DIAGNOSIS — J069 Acute upper respiratory infection, unspecified: Secondary | ICD-10-CM | POA: Diagnosis not present

## 2024-03-04 LAB — VERITOR FLU A/B WAIVED
Influenza A: NEGATIVE
Influenza B: NEGATIVE

## 2024-03-04 MED ORDER — HYDROCOD POLI-CHLORPHE POLI ER 10-8 MG/5ML PO SUER
5.0000 mL | Freq: Two times a day (BID) | ORAL | 0 refills | Status: AC | PRN
  Filled 2024-03-04: qty 120, 12d supply, fill #0

## 2024-03-04 MED ORDER — ACETAMINOPHEN 500 MG PO TABS
500.0000 mg | ORAL_TABLET | Freq: Once | ORAL | Status: AC
  Administered 2024-03-04: 500 mg via ORAL

## 2024-03-04 MED ORDER — AIRSUPRA 90-80 MCG/ACT IN AERO: 1.0000 | INHALATION_SPRAY | RESPIRATORY_TRACT | Status: AC

## 2024-03-04 MED ORDER — DEXTROMETHORPHAN-GUAIFENESIN 10-100 MG/5ML PO LIQD
5.0000 mL | Freq: Four times a day (QID) | ORAL | 1 refills | Status: DC | PRN
  Filled 2024-03-04: qty 355, 18d supply, fill #0

## 2024-03-04 MED ORDER — OSELTAMIVIR PHOSPHATE 75 MG PO CAPS
75.0000 mg | ORAL_CAPSULE | Freq: Two times a day (BID) | ORAL | 0 refills | Status: AC
  Filled 2024-03-04: qty 10, 5d supply, fill #0

## 2024-03-04 NOTE — Progress Notes (Signed)
 BP (!) 142/92   Pulse (!) 117   Temp (!) 96.7 F (35.9 C)   Ht 5' 1 (1.549 m)   Wt 158 lb (71.7 kg)   SpO2 97%   BMI 29.85 kg/m    Subjective:   Patient ID: Rollene CHRISTELLA Daughters, female    DOB: Jun 23, 1960, 63 y.o.   MRN: [id number]  HPI: MANDA HOLSTAD is a 63 y.o. female presenting on 03/04/2024 for Cough and Generalized Body Aches   Discussed the use of AI scribe software for clinical note transcription with the patient, who gave verbal consent to proceed.  History of Present Illness   Karoline Fleer is a 63 year old female with asthma and tachycardia who presents with cough, body aches, and headache after recent travel. She is accompanied by her spouse, Madeleine.  Acute respiratory and systemic symptoms - Onset of symptoms yesterday after returning from a conference in [place] - Burning, painful cough that developed after a day of air travel - Cough is painful and makes it difficult to cough effectively - Body aches present - Severe headache - Fever of 100.18F this morning - No use of Tylenol  or ibuprofen for symptom relief - Home COVID and flu tests both negative - Recent contact with multiple people during conference, no known specific exposure to illness  Asthma and tachycardia - History of asthma, previously managed with albuterol  - Recent switch to Air Supra inhaler due to tachycardia - No use of Air Supra inhaler during current illness  Immunization status - Received influenza vaccination, exact timing unclear          Relevant past medical, surgical, family and social history reviewed and updated as indicated. Interim medical history since our last visit reviewed. Allergies and medications reviewed and updated.  Review of Systems  Constitutional:  Negative for chills and fever.  HENT:  Positive for congestion, postnasal drip, rhinorrhea and sinus pressure. Negative for ear discharge, ear pain, sneezing and sore throat.   Eyes:  Negative  for pain, redness and visual disturbance.  Respiratory:  Positive for cough and wheezing. Negative for chest tightness and shortness of breath.   Cardiovascular:  Negative for chest pain and leg swelling.  Genitourinary:  Negative for difficulty urinating and dysuria.  Musculoskeletal:  Negative for back pain and gait problem.  Skin:  Negative for rash.  Neurological:  Negative for light-headedness and headaches.  Psychiatric/Behavioral:  Negative for agitation and behavioral problems.   All other systems reviewed and are negative.   Per HPI unless specifically indicated above   Allergies as of 03/04/2024       Reactions   Prednisone     Esophageal spasms   Sulfonamide Derivatives Other (See Comments)   Mouth sores   Metoclopramide Other (See Comments)   Reports adverse reaction (cognitive side effects) to IV Reglan Pt states she gets severe confusion        Medication List        Accurate as of March 04, 2024 11:43 AM. If you have any questions, ask your nurse or doctor.          atomoxetine  80 MG capsule Commonly known as: Strattera  Take 1 capsule (80 mg total) by mouth daily.   buPROPion  300 MG 24 hr tablet Commonly known as: WELLBUTRIN  XL Take 1 tablet (300 mg total) by mouth every morning.   clonazePAM  0.5 MG tablet Commonly known as: KLONOPIN  Take 1 tablet (0.5 mg total) by mouth 2 (two) times daily as  needed.   clonazePAM  0.5 MG tablet Commonly known as: KLONOPIN  Take 1 tablet (0.5 mg total) by mouth 2 (two) times daily as needed.   dextromethorphan-guaiFENesin 10-100 MG/5ML liquid Commonly known as: Tussin DM Take 5 mLs by mouth every 6 (six) hours as needed for cough. Started by: Fonda LABOR Elenie Coven   dicyclomine 10 MG capsule Commonly known as: BENTYL Take 10 mg by mouth as needed for spasms.   dimenhyDRINATE 50 MG tablet Commonly known as: DRAMAMINE Take 50 mg by mouth every 8 (eight) hours as needed.   docusate sodium 50 MG  capsule Commonly known as: COLACE Take 50 mg by mouth 2 (two) times daily.   Estradiol 10 MCG Tabs vaginal tablet Place 1 tablet (10 mcg total) vaginally 3 (three) times a week at bedtime.   ezetimibe  10 MG tablet Commonly known as: ZETIA  Take 1 tablet (10 mg total) by mouth daily.   lamoTRIgine  150 MG tablet Commonly known as: LAMICTAL  Take 1 tablet (150 mg total) by mouth daily.   levocetirizine 5 MG tablet Commonly known as: XYZAL Take 5 mg by mouth every evening.   Levothyroxine Sodium 100 MCG Caps Take 100 mcg by mouth daily.   loratadine 10 MG tablet Commonly known as: CLARITIN Take 10 mg by mouth daily.   montelukast 10 MG tablet Commonly known as: SINGULAIR Take 10 mg by mouth at bedtime.   ondansetron  4 MG disintegrating tablet Commonly known as: ZOFRAN -ODT Take 1 tablet (4 mg total) by mouth every 8 (eight) hours as needed for nausea or vomiting.   ondansetron  4 MG tablet Commonly known as: ZOFRAN  Take by mouth at bedtime.   oseltamivir 75 MG capsule Commonly known as: Tamiflu Take 1 capsule (75 mg total) by mouth 2 (two) times daily for 5 days. Started by: [facility] LABOR Adelis Docter   pantoprazole  40 MG tablet Commonly known as: PROTONIX  Take 1 tablet (40 mg total) by mouth daily.   PEPCID PO Take by mouth daily at 12 noon.   promethazine  25 MG suppository Commonly known as: PHENERGAN  Place 1 suppository (25 mg total) rectally every 6 (six) hours as needed for nausea or vomiting.   RepHresh Pro-B Caps Take by mouth.   triamcinolone cream 0.1 % Commonly known as: KENALOG Apply 1 Application topically 2 (two) times daily.   XOLAIR [place] Inject into the skin.         Objective:   BP (!) 142/92   Pulse (!) 117   Temp (!) 96.7 F (35.9 C)   Ht 5' 1 (1.549 m)   Wt 158 lb (71.7 kg)   SpO2 97%   BMI 29.85 kg/m   Wt Readings from Last 3 Encounters:  03/04/24 158 lb (71.7 kg)  01/16/24 151 lb 6.4 oz (68.7 kg)  12/27/23 156 lb (70.8 kg)     Physical Exam Vitals and nursing note reviewed.  Constitutional:      Appearance: She is ill-appearing.  Neurological:     Mental Status: She is alert.    Physical Exam   HEENT: Redness in throat, no purulence. Ears normal, no infection. NECK: No large lymph nodes palpable. CHEST: Wheezing in the upper lungs.         Assessment & Plan:   Problem List Items Addressed This Visit   None Visit Diagnoses       Upper respiratory tract infection, unspecified type    -  Primary   Relevant Medications   dextromethorphan-guaiFENesin (TUSSIN DM) 10-100 MG/5ML liquid   oseltamivir (  TAMIFLU) 75 MG capsule   Other Relevant Orders   Veritor Flu A/B Waived          Acute upper respiratory infection Acute upper respiratory symptoms post-flight, likely flu B due to seasonal trends. Negative home COVID and flu tests. Throat redness, lymph node tenderness noted. - Repeat flu test in office. - Discuss results after test. - Flu test was negative but her symptoms fit flu so we will go ahead and treat as such.  She does fit high risk category because of her age so we will treat with Tamiflu.  Asthma Exercise-induced asthma with recent symptoms. Non-compliance with AirSupra inhaler noted. - Encouraged use of AirSupra inhaler for current symptoms.  Also gave sample of Airsupra -        Follow up plan: Return if symptoms worsen or fail to improve.  Counseling provided for all of the vaccine components Orders Placed This Encounter  Procedures   Veritor Flu A/B Waived    Fonda Levins, MD [facility] 03/04/2024, 11:43 AM

## 2024-03-04 NOTE — Addendum Note (Signed)
 Addended by: LEIGH ROSINA SAILOR on: 03/04/2024 11:55 AM   Modules accepted: Orders

## 2024-03-05 ENCOUNTER — Other Ambulatory Visit (HOSPITAL_BASED_OUTPATIENT_CLINIC_OR_DEPARTMENT_OTHER): Payer: Self-pay

## 2024-03-05 DIAGNOSIS — F900 Attention-deficit hyperactivity disorder, predominantly inattentive type: Secondary | ICD-10-CM | POA: Diagnosis not present

## 2024-03-05 DIAGNOSIS — Z630 Problems in relationship with spouse or partner: Secondary | ICD-10-CM | POA: Diagnosis not present

## 2024-03-06 ENCOUNTER — Encounter: Payer: Self-pay | Admitting: Family Medicine

## 2024-03-06 ENCOUNTER — Ambulatory Visit: Admitting: Family Medicine

## 2024-03-06 ENCOUNTER — Other Ambulatory Visit (HOSPITAL_BASED_OUTPATIENT_CLINIC_OR_DEPARTMENT_OTHER): Payer: Self-pay

## 2024-03-06 VITALS — BP 136/91 | HR 109 | Temp 97.2°F | Ht 61.0 in | Wt 155.0 lb

## 2024-03-06 DIAGNOSIS — K219 Gastro-esophageal reflux disease without esophagitis: Secondary | ICD-10-CM | POA: Diagnosis not present

## 2024-03-06 DIAGNOSIS — J069 Acute upper respiratory infection, unspecified: Secondary | ICD-10-CM

## 2024-03-06 DIAGNOSIS — J029 Acute pharyngitis, unspecified: Secondary | ICD-10-CM | POA: Diagnosis not present

## 2024-03-06 MED ORDER — METHYLPREDNISOLONE ACETATE 80 MG/ML IJ SUSP
80.0000 mg | Freq: Once | INTRAMUSCULAR | Status: AC
  Administered 2024-03-06: 40 mg via INTRAMUSCULAR

## 2024-03-06 MED ORDER — LIDOCAINE VISCOUS HCL 2 % MT SOLN
10.0000 mL | Freq: Two times a day (BID) | OROMUCOSAL | 0 refills | Status: AC | PRN
  Filled 2024-03-06: qty 275, 14d supply, fill #0

## 2024-03-06 NOTE — Progress Notes (Signed)
 Subjective:  Patient ID: Kathleen Soto, female    DOB: 19-Oct-1960, 63 y.o.   MRN: [id number]  Patient Care Team: Severa Rock CHRISTELLA, FNP as PCP - General (Family Medicine)   Chief Complaint:  Sore Throat (X 1 day and also has white patches)   HPI: Kathleen Soto is a 63 y.o. female presenting on 03/06/2024 for Sore Throat (X 1 day and also has white patches)   Kathleen Soto is a 63 year old female who presents with persistent cough and weakness following a recent flu infection.  Her symptoms began on Sunday evening while on a plane returning from a convention in [place] . Initially, she experienced progressive worsening of symptoms including body aches, cough, and a burning sensation. She reports that she was prescribed Tamiflu for the flu. Currently, she continues to have a cough that is less painful than before, but she describes difficulty coughing effectively and was previously wheezing. She uses a medication referred to as airsupra and has been rinsing her mouth because she was concerned about thrush, although she has not observed any white patches herself.  She experienced fever with the highest recorded temperature being 101F, with the last fever occurring yesterday morning. No recent exposure to sick individuals aside from potential exposure on the plane. She reports sinus pressure and a pounding headache that worsened when lying down, but these symptoms have mostly resolved. She also feels nauseous and has been using Zofran  for nausea management.  No sputum production, although she felt congestion was present. Nasal discharge has been clear. She has not been using Flonase  or other nasal sprays.          Relevant past medical, surgical, family, and social history reviewed and updated as indicated.  Allergies and medications reviewed and updated. Data reviewed: Chart in Epic.   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/headache from anesthsia   Depression    GERD (gastroesophageal reflux disease)    Hives    Hyperlipidemia    Hypothyroidism    Mitral valve prolapse    Osteoporosis    Osteoporosis    Thyroid  disease    Urticaria, chronic     Past Surgical History:  Procedure Laterality Date   ABDOMINAL HYSTERECTOMY     ANAL FISSURE REPAIR     BREAST BIOPSY     CESAREAN SECTION     EXPLORATORY LAPAROTOMY     LIPOMA EXCISION N/A 06/26/2023   Procedure: CHEST WALL LIPOMA EXCISION;  Surgeon: Ebbie Cough, MD;  Location: [facility];  Service: General;  Laterality: N/A;  GENERAL & LMA   TONSILLECTOMY      Social History   Socioeconomic History   Marital status: Married    Spouse name: Not on file   Number of children: 2   Years of education: Not on file   Highest education level: Bachelor's degree (e.g., BA, AB, BS)  Occupational History   Not on file  Tobacco Use   Smoking status: Never   Smokeless tobacco: Never  Vaping Use   Vaping status: Never Used  Substance and Sexual Activity   Alcohol use: Yes    Comment: rarely   Drug use: No   Sexual activity: Not on file  Other Topics Concern   Not on file  Social History Narrative   Not on file   Social Drivers of Health   Financial  Resource Strain: Low Risk  (01/21/2024)   Received [facility]   Overall Financial Resource Strain (CARDIA)    How hard is it for you to pay for the very basics like food, housing, medical care, and heating?: Not hard at all  Food Insecurity: No Food Insecurity (01/21/2024)   Received from [facility]   Hunger Vital Sign    Within the past 12 months, you worried that your food would run out before you got the money to buy more.: Never true    Within the past 12 months, the food you bought just didn't last and you didn't have money to get more.: Never true  Transportation Needs: No Transportation Needs (01/21/2024)   Received from [facility] - Transportation    In the past 12 months, has lack of transportation kept you from medical appointments or from getting medications?: No    In the past 12 months, has lack of transportation kept you from meetings, work, or from getting things needed for daily living?: No  Physical Activity: Inactive (01/21/2024)   Received from [facility]   Exercise Vital Sign    On average, how many days per week do you engage in moderate to strenuous exercise (like a brisk walk)?: 0 days    Minutes of Exercise per Session: Not on file  Stress: No Stress Concern Present (01/21/2024)   Received from [facility] Occupational [facility] - Occupational Stress Questionnaire    Do you feel stress - tense, restless, nervous, or anxious, or unable to sleep at night because your mind is troubled all the time - these days?: Only a little  Recent Concern: Stress - Stress Concern Present (12/27/2023)   [employer] of Occupational Health - Occupational Stress Questionnaire    Feeling of Stress: Rather much  Social Connections: Socially Integrated (01/21/2024)   Received from [facility]   Social Network    How would you rate your social network (family, work, friends)?: Good participation with social networks  Intimate Partner Violence: Not At Risk (01/21/2024)   Received from [facility]   HITS    Over the last 12 months how often did your partner physically hurt you?: Never    Over the last 12 months how often did your partner insult you or talk down to you?: Rarely    Over the last 12 months how often did your partner threaten you with physical harm?: Never    Over the last 12 months how often did your partner scream or curse at you?: Rarely    Outpatient Encounter Medications as of 03/06/2024  Medication Sig   Albuterol -Budesonide (AIRSUPRA) 90-80 MCG/ACT AERO Inhale 1 Dose into the lungs as directed.   atomoxetine  (STRATTERA ) 80 MG capsule Take 1 capsule (80 mg total) by  mouth daily.   buPROPion  (WELLBUTRIN  XL) 300 MG 24 hr tablet Take 1 tablet (300 mg total) by mouth every morning.   chlorpheniramine-HYDROcodone  (TUSSIONEX) 10-8 MG/5ML Take 5 mLs by mouth every 12 (twelve) hours as needed for cough.   clonazePAM  (KLONOPIN ) 0.5 MG tablet Take 1 tablet (0.5 mg total) by mouth 2 (two) times daily as needed.   clonazePAM  (KLONOPIN ) 0.5 MG tablet Take 1 tablet (0.5 mg total) by mouth 2 (two) times daily as needed.   dicyclomine (BENTYL) 10 MG capsule Take 10 mg by mouth as needed for spasms.   dimenhyDRINATE (DRAMAMINE) 50 MG tablet Take 50 mg by mouth every 8 (eight)  hours as needed.   docusate sodium (COLACE) 50 MG capsule Take 50 mg by mouth 2 (two) times daily.   Estradiol 10 MCG TABS vaginal tablet Place 1 tablet (10 mcg total) vaginally 3 (three) times a week at bedtime.   ezetimibe  (ZETIA ) 10 MG tablet Take 1 tablet (10 mg total) by mouth daily.   Famotidine (PEPCID PO) Take by mouth daily at 12 noon.   GI Cocktail (alum & mag hydroxide, lidocaine , dicyclomine) oral mixture Take 10 mLs by mouth 2 (two) times daily as needed.   Lactobacillus (REPHRESH PRO-B) CAPS Take by mouth.   lamoTRIgine  (LAMICTAL ) 150 MG tablet Take 1 tablet (150 mg total) by mouth daily.   levocetirizine (XYZAL) 5 MG tablet Take 5 mg by mouth every evening.   Levothyroxine Sodium 100 MCG CAPS Take 100 mcg by mouth daily.    loratadine (CLARITIN) 10 MG tablet Take 10 mg by mouth daily.   montelukast (SINGULAIR) 10 MG tablet Take 10 mg by mouth at bedtime.   Omalizumab (XOLAIR [place]) Inject into the skin.   ondansetron  (ZOFRAN ) 4 MG tablet Take by mouth at bedtime.   ondansetron  (ZOFRAN -ODT) 4 MG disintegrating tablet Take 1 tablet (4 mg total) by mouth every 8 (eight) hours as needed for nausea or vomiting.   oseltamivir (TAMIFLU) 75 MG capsule Take 1 capsule (75 mg total) by mouth 2 (two) times daily for 5 days.   pantoprazole  (PROTONIX ) 40 MG tablet Take 1 tablet (40 mg total) by mouth  daily.   promethazine  (PHENERGAN ) 25 MG suppository Place 1 suppository (25 mg total) rectally every 6 (six) hours as needed for nausea or vomiting.   triamcinolone cream (KENALOG) 0.1 % Apply 1 Application topically 2 (two) times daily.   [DISCONTINUED] albuterol  (PROVENTIL  HFA) 108 (90 Base) MCG/ACT inhaler Inhale 2 puffs into the lungs every 4-6 hours as needed for cough, wheezing, and/or shortness of breath.   [EXPIRED] methylPREDNISolone  acetate (DEPO-MEDROL ) injection 80 mg    No facility-administered encounter medications on file as of 03/06/2024.    Allergies  Allergen Reactions   Prednisone      Esophageal spasms   Sulfonamide Derivatives Other (See Comments)    Mouth sores   Metoclopramide Other (See Comments)    Reports adverse reaction (cognitive side effects) to IV Reglan  Pt states she gets severe confusion    Pertinent ROS per HPI, otherwise unremarkable      Objective:  BP (!) 136/91   Pulse (!) 109   Temp (!) 97.2 F (36.2 C)   Ht 5' 1 (1.549 m)   Wt 155 lb (70.3 kg)   SpO2 98%   BMI 29.29 kg/m    Wt Readings from Last 3 Encounters:  03/06/24 155 lb (70.3 kg)  03/04/24 158 lb (71.7 kg)  01/16/24 151 lb 6.4 oz (68.7 kg)    Physical Exam Vitals and nursing note reviewed.  Constitutional:      General: She is not in acute distress.    Appearance: She is well-developed and overweight. She is ill-appearing. She is not toxic-appearing or diaphoretic.  HENT:     Head: Normocephalic and atraumatic.     Right Ear: Ear canal normal. A middle ear effusion is present. Tympanic membrane is not erythematous.     Left Ear: Ear canal normal. A middle ear effusion is present. Tympanic membrane is not erythematous.     Nose: Congestion present.     Right Turbinates: Enlarged.     Left Turbinates: Enlarged.  Mouth/Throat:     Mouth: Mucous membranes are moist. No oral lesions.     Pharynx: Oropharynx is clear. Posterior oropharyngeal erythema and postnasal  drip present. No pharyngeal swelling, oropharyngeal exudate or uvula swelling.     Comments: Tonsils absent, scaring from tonsillectomy  Eyes:     Conjunctiva/sclera: Conjunctivae normal.     Pupils: Pupils are equal, round, and reactive to light.  Cardiovascular:     Rate and Rhythm: Normal rate and regular rhythm.     Heart sounds: Normal heart sounds.  Pulmonary:     Effort: Pulmonary effort is normal.     Breath sounds: Normal breath sounds.  Musculoskeletal:     Cervical back: Normal range of motion and neck supple.  Skin:    General: Skin is warm and dry.     Capillary Refill: Capillary refill takes less than 2 seconds.  Neurological:     General: No focal deficit present.     Mental Status: She is alert and oriented to person, place, and time.  Psychiatric:        Mood and Affect: Mood normal.        Behavior: Behavior normal. Behavior is cooperative.      Results for orders placed or performed in visit on 03/04/24  HM PAP SMEAR   Collection Time: 02/26/24  4:15 PM  Result Value Ref Range   HM Pap smear NILM        Pertinent labs & imaging results that were available during my care of the patient were reviewed by me and considered in my medical decision making.  Assessment & Plan:  Kathleen Soto was seen today for sore throat.  Diagnoses and all orders for this visit:  Sore throat -     Rapid Strep Screen ([facility] ONLY)  URI with cough and congestion -     methylPREDNISolone  acetate (DEPO-MEDROL ) injection 80 mg  Gastroesophageal reflux disease without esophagitis -     GI Cocktail (alum & mag hydroxide, lidocaine , dicyclomine) oral mixture; Take 10 mLs by mouth 2 (two) times daily as needed.   Strep negative in office     Acute viral upper respiratory infection Experiencing symptoms of cough, body aches, and fever (peak 101F) since Sunday evening, likely contracted during recent travel. Initial severe cough with wheezing has improved, with no  productive cough and clear nasal discharge. Sinus pressure and headache have improved. Negative strep test indicates no bacterial infection. White patches in the throat identified as scarring from previous tonsillectomy, not thrush. Muscle weakness and nausea likely related to the viral infection. No longer contagious 24 hours after fever subsides without medication. Concerns about potential esophageal spasms from prednisone  addressed with a GI cocktail prescription. - Administer Depo Medrol  for inflammation. - Prescribe Flonase  for nasal congestion for two weeks. - Send in a GI cocktail to manage potential esophageal spasms. - Advise to contact if symptoms persist or worsen, or if fever increases, for potential antibiotic prescription.          Continue all other maintenance medications.  Follow up plan: Return if symptoms worsen or fail to improve.   Continue healthy lifestyle choices, including diet (rich in fruits, vegetables, and lean proteins, and low in salt and simple carbohydrates) and exercise (at least 30 minutes of moderate physical activity daily).  Educational handout given for URI  The above assessment and management plan was discussed with the patient. The patient verbalized understanding of and has agreed to the management plan.  Patient is aware to call the clinic if they develop any new symptoms or if symptoms persist or worsen. Patient is aware when to return to the clinic for a follow-up visit. Patient educated on when it is appropriate to go to the emergency department.   Rosaline Bruns, FNP-C [facility] [phone]

## 2024-03-06 NOTE — Progress Notes (Signed)
 03/07/2024 Name: Kathleen Soto MRN: [id number] DOB: Jan 01, 1961  Chief Complaint  Patient presents with   Osteoporosis    Kathleen Soto is a 63 y.o. year old female who presented for a telephone visit.  I connected with  Kathleen Soto on 03/07/24 by telephone and verified that I am speaking with the correct person using two identifiers.   I discussed the limitations of evaluation and management by telemedicine. The patient expressed understanding and agreed to proceed.  Patient was located in her home and PharmD in PCP office during this visit.    They were referred to the pharmacist by their PCP for assistance in managing osteoporosis .    Subjective:  Patient with hashimoto's under the care of [facility].  Recent DEXA shows osteoporosis.  Patient is interested in starting therapy for this condition  Care Team: Primary Care Provider: Severa Rock CHRISTELLA, FNP ; Next Scheduled Visit: 09/03/2024  Medication Access/Adherence  Current Pharmacy:  [facility] [place] [facility] [address] [place] [address] Phone: [phone] Fax: [phone]  [id number] [place], [place] - [address] AT [facility] [address] [place] [address] Phone: [phone] Fax: [phone]  [facility] - [place], [place] - [address] [place] [address] Phone: [phone] Fax: [phone]  [facility] [place], [place] - [address] [place] [address] Phone: [phone] Fax: [phone]   Patient reports affordability concerns with their medications: No  Patient reports access/transportation concerns to their pharmacy: No  Patient reports adherence concerns with their medications:  No     Osteoporosis:  Current medications: None Medications tried in the past: Calcium and Vitamin D  supplements  Current  supplements: None  Current physical activity: Not currently, but encouraged as able  Most recent DEXA: 02/12/2024 - Lumbar Spine T Score -1.9 - Proximal Femur T Score -2.9  Previous DEXA: 02/08/2019: - T Score Lumber Spine -1.9 - T Score Proximal Femur -2.5 01/23/2017: - T Score Lumber Spine -1.8 - T Score Proximal Femur -2.1  Current medication access support: None   Objective:  Lab Results  Component Value Date   HGBA1C 5.4 08/30/2023    Lab Results  Component Value Date   CREATININE 0.83 12/27/2023   BUN 14 12/27/2023   NA 143 12/27/2023   K 4.2 12/27/2023   CL 105 12/27/2023   CO2 22 12/27/2023    Lab Results  Component Value Date   CHOL 195 12/27/2023   HDL 42 12/27/2023   LDLCALC 86 12/27/2023   TRIG 415 (H) 12/27/2023   CHOLHDL 4.6 (H) 12/27/2023    Medications Reviewed Today     Reviewed by Billee Mliss BIRCH, RPH (Pharmacist) on 03/07/24 at 1009  Med List Status: <None>   Medication Order Taking? Sig Documenting Provider Last Dose Status Informant   Discontinued 12/11/23 1157 (External Source Cancellation)   Albuterol -Budesonide (AIRSUPRA) 90-80 MCG/ACT AERO [id number]  Inhale 1 Dose into the lungs as directed. Dettinger, Fonda LABOR, MD  Active   atomoxetine  (STRATTERA ) 80 MG capsule [id number]  Take 1 capsule (80 mg total) by mouth daily. Franchot Harlene SQUIBB, PMHNP  Active   buPROPion  (WELLBUTRIN  XL) 300 MG 24 hr tablet [id number]  Take 1 tablet (300 mg total) by mouth every morning. Franchot Harlene SQUIBB, PMHNP  Active   chlorpheniramine-HYDROcodone  (TUSSIONEX) 10-8 MG/5ML  [id number]  Take 5 mLs by mouth every 12 (twelve) hours as needed for cough. Dettinger, Fonda LABOR, MD  Active   clonazePAM  (KLONOPIN ) 0.5 MG tablet [id number]  Take 1 tablet (0.5 mg total) by mouth 2 (two) times daily as needed.   Active   clonazePAM  (KLONOPIN ) 0.5 MG tablet [id number]  Take 1 tablet (0.5 mg total) by mouth 2 (two) times daily as needed.   Active   dicyclomine (BENTYL) 10 MG capsule  [id number]  Take 10 mg by mouth as needed for spasms. [provider]  Active   dimenhyDRINATE (DRAMAMINE) 50 MG tablet [id number]  Take 50 mg by mouth every 8 (eight) hours as needed. [provider]  Active   docusate sodium (COLACE) 50 MG capsule [id number]  Take 50 mg by mouth 2 (two) times daily. [provider]  Active   Estradiol 10 MCG TABS vaginal tablet [id number]  Place 1 tablet (10 mcg total) vaginally 3 (three) times a week at bedtime.   Active   ezetimibe  (ZETIA ) 10 MG tablet [id number]  Take 1 tablet (10 mg total) by mouth daily. Severa Rock HERO, FNP  Active   Famotidine (PEPCID PO) [id number]  Take by mouth daily at 12 noon. [provider]  Active   GI Cocktail (alum & mag hydroxide, lidocaine , dicyclomine) oral mixture [id number]  Take 10 mLs by mouth 2 (two) times daily as needed. Severa Rock HERO, FNP  Active   Lactobacillus (REPHRESH PRO-B) CAPS [id number]  Take by mouth. [provider]  Active   lamoTRIgine  (LAMICTAL ) 150 MG tablet [id number]  Take 1 tablet (150 mg total) by mouth daily. Franchot Harlene SQUIBB, PMHNP  Expired 03/06/24 2359   levocetirizine (XYZAL) 5 MG tablet [id number]  Take 5 mg by mouth every evening. [provider]  Active   Levothyroxine Sodium 100 MCG CAPS [id number]  Take 100 mcg by mouth daily.  [provider]  Active   loratadine (CLARITIN) 10 MG tablet [id number]  Take 10 mg by mouth daily. [provider]  Active   montelukast (SINGULAIR) 10 MG tablet [id number]  Take 10 mg by mouth at bedtime. [provider]  Active   Omalizumab CIPRIANO [place]) [id number]  Inject into the skin. [provider]  Active   ondansetron  (ZOFRAN ) 4 MG tablet [id number]  Take by mouth at bedtime. [provider]  Active   ondansetron  (ZOFRAN -ODT) 4 MG disintegrating tablet [id number]  Take 1 tablet (4 mg total) by mouth every 8 (eight) hours as needed for nausea or vomiting. Lavell Lye A, FNP  Active    oseltamivir (TAMIFLU) 75 MG capsule [id number]  Take 1 capsule (75 mg total) by mouth 2 (two) times daily for 5 days. Dettinger, Fonda LABOR, MD  Active   pantoprazole  (PROTONIX ) 40 MG tablet [id number]  Take 1 tablet (40 mg total) by mouth daily. Severa Rock HERO, FNP  Active   promethazine  (PHENERGAN ) 25 MG suppository [id number]  Place 1 suppository (25 mg total) rectally every 6 (six) hours as needed for nausea or vomiting. Lavell Lye A, FNP  Active   triamcinolone cream (KENALOG) 0.1 % [id number]  Apply 1 Application topically 2 (two) times daily. [provider]  Active               Assessment/Plan:   Osteoporosis: - Currently not managed with pharmacotherapy - Reviewed recommendation for daily calcium intake of 1200 mg and vitamin D  intake of [phone] units  - Recommended to choose calcium citrate formulation due  to concurrent acid reflux medication, patient stated she struggles to swallow calcium tablets. - Reviewed benefits of weight bearing exercise, use of resistance bands - Recommend to start Prolia every 6 months or Reclast infusion every year depending on coverage  Follow Up Plan: When benefits determined, within 2 weeks  Powell Gallus, PharmD, MPH Pharmacy Resident  Mliss Tarry Griffin, PharmD, BCACP, CPP Clinical Pharmacist, [facility]

## 2024-03-07 ENCOUNTER — Ambulatory Visit: Payer: Self-pay | Admitting: Family Medicine

## 2024-03-07 ENCOUNTER — Telehealth: Payer: Self-pay | Admitting: Pharmacist

## 2024-03-07 ENCOUNTER — Other Ambulatory Visit

## 2024-03-07 ENCOUNTER — Telehealth: Payer: Self-pay

## 2024-03-07 DIAGNOSIS — M810 Age-related osteoporosis without current pathological fracture: Secondary | ICD-10-CM

## 2024-03-07 DIAGNOSIS — F900 Attention-deficit hyperactivity disorder, predominantly inattentive type: Secondary | ICD-10-CM | POA: Diagnosis not present

## 2024-03-07 DIAGNOSIS — M818 Other osteoporosis without current pathological fracture: Secondary | ICD-10-CM

## 2024-03-07 DIAGNOSIS — Z630 Problems in relationship with spouse or partner: Secondary | ICD-10-CM | POA: Diagnosis not present

## 2024-03-07 LAB — CULTURE, GROUP A STREP

## 2024-03-07 LAB — RAPID STREP SCREEN (MED CTR MEBANE ONLY): Strep Gp A Ag, IA W/Reflex: NEGATIVE

## 2024-03-07 MED ORDER — DENOSUMAB 60 MG/ML ~~LOC~~ SOSY
60.0000 mg | PREFILLED_SYRINGE | Freq: Once | SUBCUTANEOUS | Status: AC
  Administered 2024-03-29: 60 mg via SUBCUTANEOUS

## 2024-03-07 NOTE — Telephone Encounter (Signed)
 Patient with osteoporosis--Prolia is our 1st line recommendation T score -2.9 Not a candidate for PO alendronate or bisphosphonates due to severe GERD May have to try/fail reclast, please let me know if additional info is needed Commercial insurance--hopefully able to use copay card   Mliss Tarry Griffin, PharmD, BCACP, CPP Clinical Pharmacist, [facility]

## 2024-03-07 NOTE — Telephone Encounter (Signed)
 Benefit verification started. Will update referral once complete.

## 2024-03-07 NOTE — Telephone Encounter (Signed)
 Prolia  VOB initiated via [email or web address]  Next Prolia  inj DUE: NEW START

## 2024-03-09 ENCOUNTER — Other Ambulatory Visit (HOSPITAL_BASED_OUTPATIENT_CLINIC_OR_DEPARTMENT_OTHER): Payer: Self-pay

## 2024-03-11 ENCOUNTER — Other Ambulatory Visit (HOSPITAL_COMMUNITY): Payer: Self-pay

## 2024-03-11 NOTE — Telephone Encounter (Signed)
 PHARMACY PA SUBMITTED VIA LATENT. KEY: [id number]   MEDICAL PA SUBMITTED VIA BLUE-E.

## 2024-03-11 NOTE — Telephone Encounter (Signed)
 SABRA

## 2024-03-11 NOTE — Telephone Encounter (Signed)
 Additional information has been requested from the patient's insurance in order to proceed with the prior authorization request. Requested information has been sent, or form has been filled out and faxed back to [phone]

## 2024-03-12 ENCOUNTER — Other Ambulatory Visit (HOSPITAL_COMMUNITY): Payer: Self-pay

## 2024-03-12 DIAGNOSIS — Z630 Problems in relationship with spouse or partner: Secondary | ICD-10-CM | POA: Diagnosis not present

## 2024-03-12 DIAGNOSIS — F900 Attention-deficit hyperactivity disorder, predominantly inattentive type: Secondary | ICD-10-CM | POA: Diagnosis not present

## 2024-03-12 NOTE — Telephone Encounter (Addendum)
 MEDICAL PA APPROVED

## 2024-03-13 ENCOUNTER — Other Ambulatory Visit (HOSPITAL_BASED_OUTPATIENT_CLINIC_OR_DEPARTMENT_OTHER): Payer: Self-pay

## 2024-03-13 MED ORDER — LEVOTHYROXINE SODIUM 100 MCG PO TABS
100.0000 ug | ORAL_TABLET | Freq: Every day | ORAL | 0 refills | Status: AC
  Filled 2024-03-13: qty 90, 90d supply, fill #0

## 2024-03-14 DIAGNOSIS — F900 Attention-deficit hyperactivity disorder, predominantly inattentive type: Secondary | ICD-10-CM | POA: Diagnosis not present

## 2024-03-14 DIAGNOSIS — Z630 Problems in relationship with spouse or partner: Secondary | ICD-10-CM | POA: Diagnosis not present

## 2024-03-15 DIAGNOSIS — M542 Cervicalgia: Secondary | ICD-10-CM | POA: Diagnosis not present

## 2024-03-18 ENCOUNTER — Other Ambulatory Visit (HOSPITAL_COMMUNITY): Payer: Self-pay

## 2024-03-19 ENCOUNTER — Other Ambulatory Visit (HOSPITAL_COMMUNITY): Payer: Self-pay

## 2024-03-19 DIAGNOSIS — F900 Attention-deficit hyperactivity disorder, predominantly inattentive type: Secondary | ICD-10-CM | POA: Diagnosis not present

## 2024-03-19 DIAGNOSIS — Z630 Problems in relationship with spouse or partner: Secondary | ICD-10-CM | POA: Diagnosis not present

## 2024-03-19 NOTE — Telephone Encounter (Deleted)
 MEDICAL PA RESUBMITTED

## 2024-03-19 NOTE — Telephone Encounter (Signed)
 Pt ready for scheduling for PROLIA on or after : 03/19/24  Option# 1: Buy/Bill (Office supplied medication)  Out-of-pocket cost due at time of clinic visit: $0  Number of injection/visits approved: 2  Primary: [employer] Prolia co-insurance: 0% Admin fee co-insurance: 0%  Secondary: --- Prolia co-insurance:  Admin fee co-insurance:   Medical Benefit Details: Date Benefits were checked: 03/08/24 Deductible: NO/ Coinsurance: 0%/ Admin Fee: 0%  Prior Auth: APPROVED PA# [id number] Expiration Date: 03/11/24-03/11/25  # of doses approved: 2 ----------------------------------------------------------------------- Option# 2- Med Obtained from pharmacy:  Pharmacy benefit: Copay $--- (Paid to pharmacy) Admin Fee: --- (Pay at clinic)  Prior Auth: --- PA# Expiration Date:   # of doses approved:   If patient wants fill through the pharmacy benefit please send prescription to: ---, and include estimated need by date in rx notes. Pharmacy will ship medication directly to the office.  Patient --- eligible for Prolia Copay Card. Copay Card can make patient's cost as little as $25. Link to apply: https://www.amgensupportplus.com/copay  ** This summary of benefits is an estimation of the patient's out-of-pocket cost. Exact cost may very based on individual plan coverage.

## 2024-03-20 DIAGNOSIS — F900 Attention-deficit hyperactivity disorder, predominantly inattentive type: Secondary | ICD-10-CM | POA: Diagnosis not present

## 2024-03-20 DIAGNOSIS — Z630 Problems in relationship with spouse or partner: Secondary | ICD-10-CM | POA: Diagnosis not present

## 2024-03-21 NOTE — Telephone Encounter (Signed)
 Called and left message to call me back.

## 2024-03-21 NOTE — Telephone Encounter (Signed)
 Please have scheduled if not already completed   It looks like medical benefit?

## 2024-03-25 ENCOUNTER — Other Ambulatory Visit (HOSPITAL_BASED_OUTPATIENT_CLINIC_OR_DEPARTMENT_OTHER): Payer: Self-pay

## 2024-03-25 ENCOUNTER — Other Ambulatory Visit: Payer: Self-pay

## 2024-03-25 MED ORDER — CLONAZEPAM 0.5 MG PO TABS
0.5000 mg | ORAL_TABLET | Freq: Two times a day (BID) | ORAL | 4 refills | Status: DC | PRN
  Filled 2024-03-25: qty 60, 30d supply, fill #0

## 2024-03-26 DIAGNOSIS — Z630 Problems in relationship with spouse or partner: Secondary | ICD-10-CM | POA: Diagnosis not present

## 2024-03-26 DIAGNOSIS — F900 Attention-deficit hyperactivity disorder, predominantly inattentive type: Secondary | ICD-10-CM | POA: Diagnosis not present

## 2024-03-28 ENCOUNTER — Telehealth: Payer: Self-pay | Admitting: Family Medicine

## 2024-03-28 DIAGNOSIS — Z630 Problems in relationship with spouse or partner: Secondary | ICD-10-CM | POA: Diagnosis not present

## 2024-03-28 DIAGNOSIS — F900 Attention-deficit hyperactivity disorder, predominantly inattentive type: Secondary | ICD-10-CM | POA: Diagnosis not present

## 2024-03-28 NOTE — Telephone Encounter (Signed)
 Copied from CRM [id number]. Topic: General - Other >> Mar 28, 2024  8:20 AM Diannia H wrote: Reason for CRM: Patient was returning a call for [facility]. She stated that she left her a message to call her back and it was radiology. Could you assist? Patients callback number is [phone].

## 2024-03-29 ENCOUNTER — Ambulatory Visit

## 2024-03-29 ENCOUNTER — Encounter: Payer: Self-pay | Admitting: Family Medicine

## 2024-03-29 ENCOUNTER — Telehealth: Payer: Self-pay

## 2024-03-29 ENCOUNTER — Ambulatory Visit: Admitting: *Deleted

## 2024-03-29 DIAGNOSIS — M810 Age-related osteoporosis without current pathological fracture: Secondary | ICD-10-CM | POA: Diagnosis not present

## 2024-03-29 NOTE — Telephone Encounter (Signed)
Spoke with patient and she is not able to come in today.

## 2024-03-29 NOTE — Progress Notes (Signed)
 Patient is in office today for a nurse visit for Prolia injection, subcutaneous.  Injection was given in the  Left arm. Patient tolerated injection well.

## 2024-04-01 ENCOUNTER — Ambulatory Visit: Payer: Self-pay | Admitting: Nurse Practitioner

## 2024-04-01 ENCOUNTER — Encounter: Payer: Self-pay | Admitting: Nurse Practitioner

## 2024-04-01 ENCOUNTER — Ambulatory Visit

## 2024-04-01 ENCOUNTER — Ambulatory Visit: Admitting: Nurse Practitioner

## 2024-04-01 ENCOUNTER — Other Ambulatory Visit (HOSPITAL_BASED_OUTPATIENT_CLINIC_OR_DEPARTMENT_OTHER): Payer: Self-pay

## 2024-04-01 VITALS — BP 128/85 | HR 93 | Temp 97.4°F | Ht 61.0 in | Wt 158.0 lb

## 2024-04-01 DIAGNOSIS — J069 Acute upper respiratory infection, unspecified: Secondary | ICD-10-CM

## 2024-04-01 DIAGNOSIS — J45990 Exercise induced bronchospasm: Secondary | ICD-10-CM | POA: Insufficient documentation

## 2024-04-01 DIAGNOSIS — R059 Cough, unspecified: Secondary | ICD-10-CM | POA: Diagnosis not present

## 2024-04-01 MED ORDER — ONDANSETRON HCL 4 MG PO TABS
4.0000 mg | ORAL_TABLET | Freq: Three times a day (TID) | ORAL | 0 refills | Status: AC | PRN
  Filled 2024-04-01: qty 20, 7d supply, fill #0

## 2024-04-01 MED ORDER — AZELASTINE HCL 0.1 % NA SOLN
1.0000 | Freq: Two times a day (BID) | NASAL | 5 refills | Status: AC
  Filled 2024-04-01: qty 30, 90d supply, fill #0

## 2024-04-01 NOTE — Telephone Encounter (Signed)
 Patient has came in for appt.

## 2024-04-01 NOTE — Progress Notes (Addendum)
 Subjective:  Patient ID: Kathleen Soto, female    DOB: May 10, 1961, 63 y.o.   MRN: [id number]  Patient Care Team: Severa Rock CHRISTELLA, FNP as PCP - General (Family Medicine)   Chief Complaint:  Cough (Had flu in October, still have cough and having chest discomfort with cough )   HPI: Kathleen Soto is a 63 y.o. female presenting on 04/01/2024 for Cough (Had flu in October, still have cough and having chest discomfort with cough )   Discussed the use of AI scribe software for clinical note transcription with the patient, who gave verbal consent to proceed.  History of Present Illness Kathleen Soto is a 63 year old female with exercise-induced asthma who presents with a chronic cough.  She has been experiencing a chronic cough since a presumed case of flu B around March 04, 2024, despite a negative flu test. The cough is described as tight and is triggered by deep exhalation. It is different from previous post-illness coughs as it is non-productive and feels like wheezing, although she is unsure if she is actually wheezing. The cough sometimes causes chest pain, which is a new symptom for her.  She has a history of exercise-induced asthma and is concerned that her current symptoms could be related to asthma. She is on Xolair for hives, which is also used for asthma, and takes three antihistamines. She uses [employer] twice a day when experiencing tightness and coughs, and has been prescribed Flonase  for congestion, which she uses intermittently. She also takes Protonix  and famotidine for hives and has a history of esophageal spasms with prednisone  use, for which she has a GI cocktail on hand as a preventative measure.  She has been prescribed Tussionex for her cough, which she takes half a dose at night if needed, as it makes her sleepy and constipated. She also uses Robitussin during the day for severe coughs following bronchitis. She is cautious with Tussionex due to its  side effects and prefers not to take it unless necessary. She is also on Klonopin  but avoids taking it with Tussionex.  She mentions a previous concern for white patches on her throat, for which she was tested for strep twice, both results were negative. She received a Depo-Medrol  injection and was prescribed Flonase  and a GI cocktail at that time. She has not taken the prednisone  due to past side effects.  She plans to increase her physical activity by starting at the [facility], as she has not been very active recently. She experiences car sickness and uses Zofran  for relief. She acknowledges the need to improve her hydration, as she suspects dehydration may be affecting her symptoms. No fever or sputum production reported. Reports postnasal drip and congestion.      Relevant past medical, surgical, family, and social history reviewed and updated as indicated.  Allergies and medications reviewed and updated. Data reviewed: Chart in Epic.   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   Depression    GERD (gastroesophageal reflux disease)    Hives    Hyperlipidemia    Hypothyroidism    Mitral valve prolapse    Osteoporosis    Osteoporosis    Thyroid  disease    Urticaria, chronic     Past Surgical History:  Procedure Laterality Date   ABDOMINAL HYSTERECTOMY     ANAL FISSURE REPAIR  BREAST BIOPSY     CESAREAN SECTION     EXPLORATORY LAPAROTOMY     LIPOMA EXCISION N/A 06/26/2023   Procedure: CHEST WALL LIPOMA EXCISION;  Surgeon: Ebbie Cough, MD;  Location: [facility];  Service: General;  Laterality: N/A;  GENERAL & LMA   TONSILLECTOMY      Social History   Socioeconomic History   Marital status: Married    Spouse name: Not on file   Number of children: 2   Years of education: Not on file   Highest education level: Bachelor's degree (e.g., BA, AB, BS)  Occupational  History   Not on file  Tobacco Use   Smoking status: Never   Smokeless tobacco: Never  Vaping Use   Vaping status: Never Used  Substance and Sexual Activity   Alcohol use: Yes    Comment: rarely   Drug use: No   Sexual activity: Not on file  Other Topics Concern   Not on file  Social History Narrative   Not on file   Social Drivers of Health   Financial Resource Strain: Low Risk  (01/21/2024)   Received from [facility]   Overall Financial Resource Strain (CARDIA)    How hard is it for you to pay for the very basics like food, housing, medical care, and heating?: Not hard at all  Food Insecurity: No Food Insecurity (01/21/2024)   Received from [facility]   Hunger Vital Sign    Within the past 12 months, you worried that your food would run out before you got the money to buy more.: Never true    Within the past 12 months, the food you bought just didn't last and you didn't have money to get more.: Never true  Transportation Needs: No Transportation Needs (01/21/2024)   Received from [facility] - Transportation    In the past 12 months, has lack of transportation kept you from medical appointments or from getting medications?: No    In the past 12 months, has lack of transportation kept you from meetings, work, or from getting things needed for daily living?: No  Physical Activity: Inactive (01/21/2024)   Received from [facility]   Exercise Vital Sign    On average, how many days per week do you engage in moderate to strenuous exercise (like a brisk walk)?: 0 days    Minutes of Exercise per Session: Not on file  Stress: No Stress Concern Present (01/21/2024)   Received from [facility] of Occupational Health - Occupational Stress Questionnaire    Do you feel stress - tense, restless, nervous, or anxious, or unable to sleep at night because your mind is troubled all the time - these days?: Only a little  Recent Concern: Stress - Stress Concern  Present (12/27/2023)   [employer] of Occupational Health - Occupational Stress Questionnaire    Feeling of Stress: Rather much  Social Connections: Socially Integrated (01/21/2024)   Received from [facility]   Social Network    How would you rate your social network (family, work, friends)?: Good participation with social networks  Intimate Partner Violence: Not At Risk (01/21/2024)   Received from [facility]   HITS    Over the last 12 months how often did your partner physically hurt you?: Never    Over the last 12 months how often did your partner insult you or talk down to you?: Rarely    Over the last 12  months how often did your partner threaten you with physical harm?: Never    Over the last 12 months how often did your partner scream or curse at you?: Rarely    Outpatient Encounter Medications as of 04/01/2024  Medication Sig   Albuterol -Budesonide (AIRSUPRA) 90-80 MCG/ACT AERO Inhale 1 Dose into the lungs as directed.   atomoxetine  (STRATTERA ) 80 MG capsule Take 1 capsule (80 mg total) by mouth daily.   azelastine (ASTELIN) 0.1 % nasal spray Place 1 spray into both nostrils 2 (two) times daily. Use in each nostril as directed   buPROPion  (WELLBUTRIN  XL) 300 MG 24 hr tablet Take 1 tablet (300 mg total) by mouth every morning.   chlorpheniramine-HYDROcodone  (TUSSIONEX) 10-8 MG/5ML Take 5 mLs by mouth every 12 (twelve) hours as needed for cough.   clonazePAM  (KLONOPIN ) 0.5 MG tablet Take 1 tablet (0.5 mg total) by mouth 2 (two) times daily as needed.   clonazePAM  (KLONOPIN ) 0.5 MG tablet Take 1 tablet (0.5 mg total) by mouth 2 (two) times daily as needed.   dicyclomine (BENTYL) 10 MG capsule Take 10 mg by mouth as needed for spasms.   dimenhyDRINATE (DRAMAMINE) 50 MG tablet Take 50 mg by mouth every 8 (eight) hours as needed.   docusate sodium (COLACE) 50 MG capsule Take 50 mg by mouth 2 (two) times daily.   Estradiol 10 MCG TABS vaginal tablet Place 1 tablet (10 mcg total)  vaginally 3 (three) times a week at bedtime.   ezetimibe  (ZETIA ) 10 MG tablet Take 1 tablet (10 mg total) by mouth daily.   Famotidine (PEPCID PO) Take by mouth daily at 12 noon.   GI Cocktail (alum & mag hydroxide, lidocaine , dicyclomine) oral mixture Take 10 mLs by mouth 2 (two) times daily as needed.   Lactobacillus (REPHRESH PRO-B) CAPS Take by mouth.   lamoTRIgine  (LAMICTAL ) 150 MG tablet Take 1 tablet (150 mg total) by mouth daily.   levocetirizine (XYZAL) 5 MG tablet Take 5 mg by mouth every evening.   levothyroxine (SYNTHROID) 100 MCG tablet Take 1 tablet (100 mcg total) by mouth daily.   Levothyroxine Sodium 100 MCG CAPS Take 100 mcg by mouth daily.    loratadine (CLARITIN) 10 MG tablet Take 10 mg by mouth daily.   montelukast (SINGULAIR) 10 MG tablet Take 10 mg by mouth at bedtime.   Omalizumab (XOLAIR [place]) Inject into the skin.   pantoprazole  (PROTONIX ) 40 MG tablet Take 1 tablet (40 mg total) by mouth daily.   promethazine  (PHENERGAN ) 25 MG suppository Place 1 suppository (25 mg total) rectally every 6 (six) hours as needed for nausea or vomiting.   triamcinolone cream (KENALOG) 0.1 % Apply 1 Application topically 2 (two) times daily.   [DISCONTINUED] ondansetron  (ZOFRAN ) 4 MG tablet Take by mouth at bedtime.   [DISCONTINUED] ondansetron  (ZOFRAN -ODT) 4 MG disintegrating tablet Take 1 tablet (4 mg total) by mouth every 8 (eight) hours as needed for nausea or vomiting.   ondansetron  (ZOFRAN ) 4 MG tablet Take 1 tablet (4 mg total) by mouth every 8 (eight) hours as needed for nausea or vomiting.   [DISCONTINUED] albuterol  (PROVENTIL  HFA) 108 (90 Base) MCG/ACT inhaler Inhale 2 puffs into the lungs every 4-6 hours as needed for cough, wheezing, and/or shortness of breath.   No facility-administered encounter medications on file as of 04/01/2024.    Allergies  Allergen Reactions   Prednisone      Esophageal spasms   Sulfonamide Derivatives Other (See Comments)    Mouth sores  Metoclopramide Other (See Comments)    Reports adverse reaction (cognitive side effects) to IV Reglan  Pt states she gets severe confusion    Pertinent ROS per HPI, otherwise unremarkable      Objective:  BP 128/85   Pulse 93   Temp (!) 97.4 F (36.3 C) (Temporal)   Ht 5' 1 (1.549 m)   Wt 158 lb (71.7 kg)   SpO2 99%   BMI 29.85 kg/m    Wt Readings from Last 3 Encounters:  04/01/24 158 lb (71.7 kg)  03/06/24 155 lb (70.3 kg)  03/04/24 158 lb (71.7 kg)    Physical Exam Vitals and nursing note reviewed.  Constitutional:      General: She is not in acute distress. HENT:     Head: Normocephalic.     Right Ear: Tympanic membrane, ear canal and external ear normal. There is no impacted cerumen.     Left Ear: Tympanic membrane, ear canal and external ear normal. There is no impacted cerumen.     Nose: Congestion present.     Right Turbinates: Swollen.     Left Turbinates: Swollen.     Mouth/Throat:     Lips: Pink.     Mouth: Mucous membranes are dry.     Pharynx: Oropharynx is clear. No pharyngeal swelling or postnasal drip.     Tonsils: No tonsillar exudate.  Eyes:     General: No scleral icterus.    Extraocular Movements: Extraocular movements intact.     Conjunctiva/sclera: Conjunctivae normal.     Pupils: Pupils are equal, round, and reactive to light.  Cardiovascular:     Heart sounds: Normal heart sounds.  Pulmonary:     Effort: Pulmonary effort is normal.     Breath sounds: Normal breath sounds.  Musculoskeletal:        General: Normal range of motion.     Right lower leg: No edema.     Left lower leg: No edema.  Skin:    General: Skin is warm and dry.     Findings: No rash.  Neurological:     Mental Status: She is alert and oriented to person, place, and time.  Psychiatric:        Mood and Affect: Mood normal.        Behavior: Behavior normal.        Thought Content: Thought content normal.        Judgment: Judgment normal.    Physical  Exam HEENT: Loose nasal discharge present.     Results for orders placed or performed in visit on 03/06/24  Rapid Strep Screen ([facility] ONLY)   Collection Time: 03/06/24 11:32 AM   Specimen: Other   Other  Result Value Ref Range   Strep Gp A Ag, IA W/Reflex Negative Negative  Culture, Group A Strep   Collection Time: 03/06/24 11:32 AM   Other  Result Value Ref Range   Strep A Culture CANCELED        Pertinent labs & imaging results that were available during my care of the patient were reviewed by me and considered in my medical decision making.  Assessment & Plan:  Audri Kozub was seen today for cough.  Diagnoses and all orders for this visit:  URI with cough and congestion -     DG Chest 2 View -     azelastine (ASTELIN) 0.1 % nasal spray; Place 1 spray into both nostrils 2 (two) times daily. Use in each nostril as  directed  Exercise-induced asthma with acute exacerbation -     DG Chest 2 View  Other orders -     ondansetron  (ZOFRAN ) 4 MG tablet; Take 1 tablet (4 mg total) by mouth every 8 (eight) hours as needed for nausea or vomiting.     Assessment and Plan Lebron is 63 year old Caucasian female seen today for URI, no acute distress Assessment & Plan Chronic cough with postnasal drip and chronic rhinitis Chronic cough since flu B infection, associated with postnasal drip and rhinitis. Differential includes asthma exacerbation. Tussionex effective but causes drowsiness and constipation. - Ordered chest x-ray to evaluate persistent cough; awaiting final report from radiologist. - Prescribed Astelin nasal spray twice daily for postnasal drip. - Advised Flonase  between Astelin doses if needed. - Encouraged hydration to help dislodge chest congestion. - Advised Tussionex twice daily as prescribed if cough persists, despite drowsiness.  Asthma Exercise-induced asthma with chest tightness and cough. Using [employer] as needed. 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 - Encouraged resumption of [facility]  Chronic urticaria (hives) Chronic urticaria managed with Xolair and antihistamines. - Continue current management with Xolair and antihistamines.  Constipation Constipation possibly exacerbated by Testanex use. Prefers over-the-counter stool softeners. - Advised use of over-the-counter stool softeners as needed.  Motion sickness Managed with Zofran , especially during travel. - Refilled Zofran  prescription for motion sickness management.      Continue all other maintenance medications.  Follow up plan: Return if symptoms worsen or fail to improve.   Continue healthy lifestyle choices, including diet (rich in fruits, vegetables, and lean proteins, and low in salt and simple carbohydrates) and exercise (at least 30 minutes of moderate physical activity daily).  Educational handout given for    Clinical References  Upper Respiratory Infection, Adult An upper respiratory infection (URI) affects the nose, throat, and upper airways that lead to the lungs. The most common type of URI is often called the common cold. URIs usually get better on their own, without medical treatment. What are the causes? A URI is caused by a germ (virus). You may catch these germs by: Breathing in droplets from an infected person's cough or sneeze. Touching something that has the germ on it (is contaminated) and then touching your mouth, nose, or eyes. What increases the risk? You are more likely to get a URI if: You are very young or very old. You have close contact with others, such as at work, school, or a health care facility. You smoke. You have long-term (chronic) heart or lung disease. You have a weakened disease-fighting system (immune system). You have nasal allergies or asthma. You have a lot of stress. You have poor nutrition. What are the signs or symptoms? Runny or stuffy (congested)  nose. Cough. Sneezing. Sore throat. Headache. Feeling tired (fatigue). Fever. Not wanting to eat as much as usual. Pain in your forehead, behind your eyes, and over your cheekbones (sinus pain). Muscle aches. Redness or irritation of the eyes. Pressure in the ears or face. How is this treated? URIs usually get better on their own within 7-10 days. Medicines cannot cure URIs, but your doctor may recommend certain medicines to help relieve symptoms, such as: Over-the-counter cold medicines. Medicines to reduce coughing (cough suppressants). Coughing is a type of defense against infection that helps to clear the nose, throat, windpipe, and lungs (respiratory system). Take these medicines only as told by your doctor. Medicines to lower your fever. Follow  these instructions at home: Activity Rest as needed. If you have a fever, stay home from work or school until your fever is gone, or until your doctor says you may return to work or school. You should stay home until you cannot spread the infection anymore (you are not contagious). Your doctor may have you wear a face mask so you have less risk of spreading the infection. Relieving symptoms Rinse your mouth often with salt water. To make salt water, dissolve -1 tsp (3-6 g) of salt in 1 cup (237 mL) of warm water. Use a cool-mist humidifier to add moisture to the air. This can help you breathe more easily. Eating and drinking  Drink enough fluid to keep your pee (urine) pale yellow. Eat soups and other clear broths. General instructions  Take over-the-counter and prescription medicines only as told by your doctor. Do not smoke or use any products that contain nicotine or tobacco. If you need help quitting, ask your doctor. Avoid being where people are smoking (avoid secondhand smoke). Stay up to date on all your shots (immunizations), and get the flu shot every year. Keep all follow-up visits. How to prevent the spread of infection  to others  Wash your hands with soap and water for at least 20 seconds. If you cannot use soap and water, use hand sanitizer. Avoid touching your mouth, face, eyes, or nose. Cough or sneeze into a tissue or your sleeve or elbow. Do not cough or sneeze into your hand or into the air. Contact a doctor if: You are getting worse, not better. You have any of these: A fever or chills. Brown or red mucus in your nose. Yellow or brown fluid (discharge)coming from your nose. Pain in your face, especially when you bend forward. Swollen neck glands. Pain when you swallow. White areas in the back of your throat. Get help right away if: You have shortness of breath that gets worse. You have very bad or constant: Headache. Ear pain. Pain in your forehead, behind your eyes, and over your cheekbones (sinus pain). Chest pain. You have long-lasting (chronic) lung disease along with any of these: Making high-pitched whistling sounds when you breathe, most often when you breathe out (wheezing). Long-lasting cough (more than 14 days). Coughing up blood. A change in your usual mucus. You have a stiff neck. You have changes in your: Vision. Hearing. Thinking. Mood. These symptoms may be an emergency. Get help right away. Call 911. Do not wait to see if the symptoms will go away. Do not drive yourself to the hospital. Summary An upper respiratory infection (URI) is caused by a germ (virus). The most common type of URI is often called the common cold. URIs usually get better within 7-10 days. Take over-the-counter and prescription medicines only as told by your doctor. This information is not intended to replace advice given to you by your health care provider. Make sure you discuss any questions you have with your health care provider. Document Revised: 12/02/2020 Document Reviewed: 12/02/2020 Elsevier Patient Education  2024 Else[employer]. Exercise-Induced Bronchoconstriction,  Adult  Exercise-induced bronchoconstriction (EIB) happens when the lower airways narrow during or after vigorous activity or exercise. The lower airways are the air passages (bronchi) inside the lungs. When the airways narrow, this can cause coughing, high-pitched whistling sounds when you breathe, most often when you breathe out (wheezing), and trouble breathing (shortness of breath). This makes it hard to breathe. Anyone can develop EIB, even people who do not have allergies  or asthma. With proper treatment, most people with EIB can be active and exercise normally. What are the causes? The exact cause of this condition is not known. Symptoms are brought on (triggered) by physical activity. EIB can also be triggered by: Breathing very cold and dry air more than hot and humid air. Chemicals, such as chlorine in swimming pools, pesticides, or fertilizers. Outdoor triggers, such as: Air pollution. Car exhaust. Pollen from grass, trees, or flowers. Campfire smoke. Indoor triggers, such as: Dust. Mold. Tobacco smoke. Cleaning solutions. Animal dander. What increases the risk? You are more likely to develop this condition if you: Have a family history of asthma or allergies (atopy). Participate in sports that require constant motion, such as basketball, hockey, skiing, and running. Work outdoors. Exercise where there are higher levels of one or more EIB triggers. What are the signs or symptoms? Symptoms of this condition include: Coughing. Wheezing. Shortness of breath. Chest pain or tightness. Sore throat. Upset stomach. Symptoms may worsen after exercise has stopped. How is this diagnosed? EIB is diagnosed with your medical history and a physical exam. You may also have other tests, including: Lung function studies (spirometry). An exercise test to check for EIB symptoms. Allergy tests. How is this treated? Treatment for EIB includes preventing symptoms when possible, and  treating EIB quickly when symptoms occur. Treatment may include: Taking medicine that your health care provider prescribes. Medicine comes in different forms, including: Medicines that you breathe in (inhale). These include: Steroids. These help to control your symptoms and are usually taken every day. Quick relief medicines. These help to quickly relieve your breathing difficulty. Medicines that you take by mouth (orally). These help to control allergies and asthma. Avoiding triggers. Stopping physical activity or exercise to rest. Follow these instructions at home: Take over-the-counter and prescription medicines only as told by your health care provider. Do not use any products that contain nicotine or tobacco. These products include cigarettes, chewing tobacco, and vaping devices, such as e-cigarettes. If you need help quitting, ask your health care provider. Make changes in your workout as told by your health care provider. Exercise is important to your health and well-being. Keep quick relief medicine with you when you are exercising. Tell your exercise partners about your condition. Wear a medical ID bracelet. If you are planning to exercise alone or in an isolated area, let someone know where you are going and when you will be back. You may need to see a health care provider who specializes in allergies (allergist) or the lungs (pulmonologist) for more tests. Keep all follow-up visits. This is important. How is this prevented? Take medicines to prevent EIB as told by your health care provider. Warm up before starting to play sports or exercise. Exercise indoors to avoid outdoor triggers. Cover your nose and mouth with a scarf to warm air that is very cold. Tell your workout partners or trainer about your condition. Tell them how to help you if you have an episode. Contact a health care provider if: You have coughing, wheezing, or shortness of breath that continues after  treatment. Your coughing wakes you up at night. You have less endurance than you used to. Get help right away if: Your medicine is not helping you breathe better. You cannot catch your breath. You pass out. These symptoms may be an emergency. Get help right away. Call 911. Do not wait to see if the symptoms will go away. Do not drive yourself to the hospital. Summary Exercise-induced  bronchoconstriction (EIB) happens when the airways narrow during or after exercise. When the airways narrow, this can cause coughing, wheezing, and shortness of breath. It can be difficult to breathe. Take over-the-counter and prescription medicines only as told by your health care provider. Make changes in your workout as told by your health care provider. Contact a health care provider if you continue to have trouble breathing after treatment. This information is not intended to replace advice given to you by your health care provider. Make sure you discuss any questions you have with your health care provider. Document Revised: 03/16/2021 Document Reviewed: 02/21/2021 Elsevier Patient Education  2024 [employer] Inc. Exercise-Induced Hives  Hives are itchy, red, swollen areas of skin. Exercise-induced hives are hives that are caused by physical activity. They can form on any part of the body during or after exercise. The hives may look like blotches or welts. They may be red on the outer edges and white in the middle. Pressing on the center of a hive may cause it to turn white (blanch). Hives can be one symptom of an allergic reaction. Other symptoms may include: Skin color changes (redness). High-pitched whistling sounds when you breathe, most often when you breathe out (wheezing). Coughing or trouble breathing. Stomach cramps. Headache. Swelling of the face, tongue, or hands. Dizziness. Follow these instructions at home: Medicines Take over-the-counter and prescription medicines only as told by your  health care provider. Keep an auto-injector pen with you when you exercise. An auto-injector pen is a device filled with medicine that gives an emergency shot of epinephrine . This could save your life. Use the auto-injector pen as told by your provider. Managing pain, itching, and swelling Treat symptoms as soon as they start. Apply a cold compress or bathe in cool water as told by your provider. This may help relieve itching. Wear loose clothing. This may decrease pain or burning. Lifestyle Stop exercising as soon as you get hives. This can help keep the hives from getting worse. Avoid known triggers when possible. Hives may be brought on by eating certain foods before you exercise. Watch for triggers or patterns. Pay attention to what you eat for a few weeks. Keep a journal to help track what causes your hives. Always exercise with someone. Tell them about your condition. General instructions Pay attention to changes in your symptoms. Tell your provider about any changes. Keep all follow-up visits. This will help your provider keep track of your triggers and other allergies. Contact a health care provider if: Your hives do not go away in 5-10 minutes. You get other symptoms. You still have hives after 1 month. Get help right away if: You have symptoms of a severe allergic reaction (anaphylactic reaction). These may include: Flushed skin or hives. Swelling of the eyes, lips, face, mouth, tongue, or throat. Trouble breathing, speaking, or swallowing. Wheezing. Feeling dizzy or fainting. Pain or cramping in the abdomen. Vomiting or diarrhea. If you use an auto-injector pen, you must still get emergency medical help. This is true even if the medicine seems to be working. These symptoms may be an emergency. Use the auto-injector pen right away. Then call 911. Do not wait to see if the symptoms will go away. Do not drive yourself to the hospital. This information is not intended to  replace advice given to you by your health care provider. Make sure you discuss any questions you have with your health care provider. Document Revised: 01/18/2022 Document Reviewed: 01/18/2022 Elsevier Patient Education  2024  [employer].  The above assessment and management plan was discussed with the patient. The patient verbalized understanding of and has agreed to the management plan. Patient is aware to call the clinic if they develop any new symptoms or if symptoms persist or worsen. Patient is aware when to return to the clinic for a follow-up visit. Patient educated on when it is appropriate to go to the emergency department.    Tamar Miano [place] Thompson, DNP [facility] [address] [place], [place] [address] [phone]

## 2024-04-02 DIAGNOSIS — F900 Attention-deficit hyperactivity disorder, predominantly inattentive type: Secondary | ICD-10-CM | POA: Diagnosis not present

## 2024-04-02 DIAGNOSIS — Z630 Problems in relationship with spouse or partner: Secondary | ICD-10-CM | POA: Diagnosis not present

## 2024-04-04 DIAGNOSIS — Z630 Problems in relationship with spouse or partner: Secondary | ICD-10-CM | POA: Diagnosis not present

## 2024-04-04 DIAGNOSIS — F900 Attention-deficit hyperactivity disorder, predominantly inattentive type: Secondary | ICD-10-CM | POA: Diagnosis not present

## 2024-04-09 ENCOUNTER — Ambulatory Visit

## 2024-04-09 ENCOUNTER — Other Ambulatory Visit (HOSPITAL_BASED_OUTPATIENT_CLINIC_OR_DEPARTMENT_OTHER): Payer: Self-pay

## 2024-04-09 ENCOUNTER — Encounter: Payer: Self-pay | Admitting: Family Medicine

## 2024-04-09 ENCOUNTER — Ambulatory Visit: Admitting: Family Medicine

## 2024-04-09 VITALS — BP 148/97 | HR 97 | Temp 97.0°F | Ht 61.0 in | Wt 160.6 lb

## 2024-04-09 DIAGNOSIS — G8929 Other chronic pain: Secondary | ICD-10-CM

## 2024-04-09 DIAGNOSIS — Z630 Problems in relationship with spouse or partner: Secondary | ICD-10-CM | POA: Diagnosis not present

## 2024-04-09 DIAGNOSIS — S9031XA Contusion of right foot, initial encounter: Secondary | ICD-10-CM | POA: Diagnosis not present

## 2024-04-09 DIAGNOSIS — M25562 Pain in left knee: Secondary | ICD-10-CM

## 2024-04-09 DIAGNOSIS — M25511 Pain in right shoulder: Secondary | ICD-10-CM

## 2024-04-09 DIAGNOSIS — T148XXA Other injury of unspecified body region, initial encounter: Secondary | ICD-10-CM

## 2024-04-09 DIAGNOSIS — F900 Attention-deficit hyperactivity disorder, predominantly inattentive type: Secondary | ICD-10-CM | POA: Diagnosis not present

## 2024-04-09 MED ORDER — NAPROXEN 500 MG PO TABS
500.0000 mg | ORAL_TABLET | Freq: Two times a day (BID) | ORAL | 0 refills | Status: AC
  Filled 2024-04-09: qty 28, 14d supply, fill #0

## 2024-04-09 NOTE — Progress Notes (Signed)
 Subjective:  Patient ID: Kathleen Soto, female    DOB: 07/07/1960, 63 y.o.   MRN: [id number]  Patient Care Team: Severa Rock CHRISTELLA, FNP as PCP - General (Family Medicine)   Chief Complaint:  Knee Pain (Left knee pain and swelling x 1 wee.  States that it burns. ), Fall (X 1 week ago and states that her right foot is painful and bruised ), and Arm Pain (Right arm pain x 1 month )   HPI: TWYLIA Soto is a 63 y.o. female presenting on 04/09/2024 for Knee Pain (Left knee pain and swelling x 1 wee.  States that it burns. ), Fall (X 1 week ago and states that her right foot is painful and bruised ), and Arm Pain (Right arm pain x 1 month )   Kathleen Soto is a 63 year old female who presents with knee pain and swelling.  She has been experiencing knee pain and swelling since last Friday. The pain is described as a burning sensation, especially when touched or when she kneels. The onset of pain occurred after kneeling to retrieve a ball. The pain intensified last night when she leaned over a car seat, causing significant discomfort. The swelling is noticeable but not massive. No significant pain is noted while walking, but there is pain when sitting or touching the knee. She has not been using a brace or wrapping the knee.  She mentions a fall last week where she slipped on stones on an old wood floor, resulting in a bruised foot. The foot was significantly bruised but is improving. She does not believe it is broken, as it only hurts when wearing a shoe due to the bruise.  She also reports ongoing shoulder pain since October, which she associates with a previous flu episode. The pain is located in the shoulder and is described as a weakness when lifting, particularly when trying to lift her suitcase. The pain persists and is described as an ache that catches when lifting the arm. She has not undergone any therapy for this issue.  She takes Pepcid daily and also uses Protonix .  She has not tried any specific treatment for the knee pain yet.          Relevant past medical, surgical, family, and social history reviewed and updated as indicated.  Allergies and medications reviewed and updated. Data reviewed: Chart in Epic.   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   Depression    GERD (gastroesophageal reflux disease)    Hives    Hyperlipidemia    Hypothyroidism    Mitral valve prolapse    Osteoporosis    Osteoporosis    Thyroid  disease    Urticaria, chronic     Past Surgical History:  Procedure Laterality Date   ABDOMINAL HYSTERECTOMY     ANAL FISSURE REPAIR     BREAST BIOPSY     CESAREAN SECTION     EXPLORATORY LAPAROTOMY     LIPOMA EXCISION N/A 06/26/2023   Procedure: CHEST WALL LIPOMA EXCISION;  Surgeon: Ebbie Cough, MD;  Location: [facility];  Service: General;  Laterality: N/A;  GENERAL & LMA   TONSILLECTOMY      Social History   Socioeconomic History   Marital status: Married    Spouse name: Not on file   Number of children: 2   Years of  education: Not on file   Highest education level: Bachelor's degree (e.g., BA, AB, BS)  Occupational History   Not on file  Tobacco Use   Smoking status: Never   Smokeless tobacco: Never  Vaping Use   Vaping status: Never Used  Substance and Sexual Activity   Alcohol use: Yes    Comment: rarely   Drug use: No   Sexual activity: Not on file  Other Topics Concern   Not on file  Social History Narrative   Not on file   Social Drivers of Health   Financial Resource Strain: Low Risk  (01/21/2024)   Received [facility]   Overall Financial Resource Strain (CARDIA)    How hard is it for you to pay for the very basics like food, housing, medical care, and heating?: Not hard at all  Food Insecurity: No Food Insecurity (01/21/2024)   Received from [facility]   Hunger Vital  Sign    Within the past 12 months, you worried that your food would run out before you got the money to buy more.: Never true    Within the past 12 months, the food you bought just didn't last and you didn't have money to get more.: Never true  Transportation Needs: No Transportation Needs (01/21/2024)   Received from [facility] - Transportation    In the past 12 months, has lack of transportation kept you from medical appointments or from getting medications?: No    In the past 12 months, has lack of transportation kept you from meetings, work, or from getting things needed for daily living?: No  Physical Activity: Inactive (01/21/2024)   Received from [facility]   Exercise Vital Sign    On average, how many days per week do you engage in moderate to strenuous exercise (like a brisk walk)?: 0 days    Minutes of Exercise per Session: Not on file  Stress: No Stress Concern Present (01/21/2024)   Received from [facility] of Occupational Health - Occupational Stress Questionnaire    Do you feel stress - tense, restless, nervous, or anxious, or unable to sleep at night because your mind is troubled all the time - these days?: Only a little  Recent Concern: Stress - Stress Concern Present (12/27/2023)   [employer] of Occupational Health - Occupational Stress Questionnaire    Feeling of Stress: Rather much  Social Connections: Socially Integrated (01/21/2024)   Received from [facility]   Social Network    How would you rate your social network (family, work, friends)?: Good participation with social networks  Intimate Partner Violence: Not At Risk (01/21/2024)   Received from [facility]   HITS    Over the last 12 months how often did your partner physically hurt you?: Never    Over the last 12 months how often did your partner insult you or talk down to you?: Rarely    Over the last 12 months how often did your partner threaten you with physical harm?:  Never    Over the last 12 months how often did your partner scream or curse at you?: Rarely    Outpatient Encounter Medications as of 04/09/2024  Medication Sig   Albuterol -Budesonide (AIRSUPRA ) 90-80 MCG/ACT AERO Inhale 1 Dose into the lungs as directed.   atomoxetine  (STRATTERA ) 80 MG capsule Take 1 capsule (80 mg total) by mouth daily.   azelastine  (ASTELIN ) 0.1 % nasal spray Place 1 spray into both  nostrils 2 (two) times daily. Use in each nostril as directed   buPROPion  (WELLBUTRIN  XL) 300 MG 24 hr tablet Take 1 tablet (300 mg total) by mouth every morning.   chlorpheniramine-HYDROcodone  (TUSSIONEX) 10-8 MG/5ML Take 5 mLs by mouth every 12 (twelve) hours as needed for cough.   clonazePAM  (KLONOPIN ) 0.5 MG tablet Take 1 tablet (0.5 mg total) by mouth 2 (two) times daily as needed.   dicyclomine (BENTYL) 10 MG capsule Take 10 mg by mouth as needed for spasms.   dimenhyDRINATE (DRAMAMINE) 50 MG tablet Take 50 mg by mouth every 8 (eight) hours as needed.   docusate sodium (COLACE) 50 MG capsule Take 50 mg by mouth 2 (two) times daily.   Estradiol  10 MCG TABS vaginal tablet Place 1 tablet (10 mcg total) vaginally 3 (three) times a week at bedtime.   ezetimibe  (ZETIA ) 10 MG tablet Take 1 tablet (10 mg total) by mouth daily.   Famotidine (PEPCID PO) Take by mouth daily at 12 noon.   GI Cocktail (alum & mag hydroxide, lidocaine , dicyclomine) oral mixture Take 10 mLs by mouth 2 (two) times daily as needed.   Lactobacillus (REPHRESH PRO-B) CAPS Take by mouth.   lamoTRIgine  (LAMICTAL ) 150 MG tablet Take 1 tablet (150 mg total) by mouth daily.   levocetirizine (XYZAL) 5 MG tablet Take 5 mg by mouth every evening.   levothyroxine  (SYNTHROID ) 100 MCG tablet Take 1 tablet (100 mcg total) by mouth daily.   loratadine (CLARITIN) 10 MG tablet Take 10 mg by mouth daily.   montelukast (SINGULAIR) 10 MG tablet Take 10 mg by mouth at bedtime.   naproxen  (NAPROSYN ) 500 MG tablet Take 1 tablet (500 mg total)  by mouth 2 (two) times daily with a meal for 14 days.   Omalizumab (XOLAIR [place]) Inject into the skin.   ondansetron  (ZOFRAN ) 4 MG tablet Take 1 tablet (4 mg total) by mouth every 8 (eight) hours as needed for nausea or vomiting.   pantoprazole  (PROTONIX ) 40 MG tablet Take 1 tablet (40 mg total) by mouth daily.   promethazine  (PHENERGAN ) 25 MG suppository Place 1 suppository (25 mg total) rectally every 6 (six) hours as needed for nausea or vomiting.   triamcinolone cream (KENALOG) 0.1 % Apply 1 Application topically 2 (two) times daily.   [DISCONTINUED] albuterol  (PROVENTIL  HFA) 108 (90 Base) MCG/ACT inhaler Inhale 2 puffs into the lungs every 4-6 hours as needed for cough, wheezing, and/or shortness of breath.   [DISCONTINUED] clonazePAM  (KLONOPIN ) 0.5 MG tablet Take 1 tablet (0.5 mg total) by mouth 2 (two) times daily as needed.   [DISCONTINUED] Levothyroxine  Sodium 100 MCG CAPS Take 100 mcg by mouth daily.    No facility-administered encounter medications on file as of 04/09/2024.    Allergies  Allergen Reactions   Prednisone      Esophageal spasms   Sulfonamide Derivatives Other (See Comments)    Mouth sores   Metoclopramide Other (See Comments)    Reports adverse reaction (cognitive side effects) to IV Reglan  Pt states she gets severe confusion    Pertinent ROS per HPI, otherwise unremarkable      Objective:  BP (!) 148/97   Pulse 97   Temp (!) 97 F (36.1 C)   Ht 5' 1 (1.549 m)   Wt 160 lb 9.6 oz (72.8 kg)   SpO2 98%   BMI 30.35 kg/m    Wt Readings from Last 3 Encounters:  04/09/24 160 lb 9.6 oz (72.8 kg)  04/01/24 158 lb (71.7  kg)  03/06/24 155 lb (70.3 kg)    Physical Exam Vitals and nursing note reviewed.  Constitutional:      General: She is not in acute distress.    Appearance: Normal appearance. She is obese. She is not ill-appearing, toxic-appearing or diaphoretic.  HENT:     Head: Normocephalic and atraumatic.     Mouth/Throat:     Mouth: Mucous  membranes are moist.  Eyes:     Pupils: Pupils are equal, round, and reactive to light.  Cardiovascular:     Rate and Rhythm: Normal rate and regular rhythm.     Heart sounds: Normal heart sounds.  Pulmonary:     Effort: Pulmonary effort is normal.     Breath sounds: Normal breath sounds.  Musculoskeletal:     Right shoulder: Tenderness present. No swelling, deformity, effusion, laceration, bony tenderness or crepitus. Decreased range of motion. Normal strength. Normal pulse.     Right upper arm: Normal.     Cervical back: Normal and neck supple.     Left upper leg: Normal.     Left knee: Swelling present. No deformity, effusion, erythema, ecchymosis, lacerations, bony tenderness or crepitus. Normal range of motion. Tenderness present over the ACL. No medial joint line, lateral joint line, MCL, LCL, PCL or patellar tendon tenderness. No LCL laxity, MCL laxity, ACL laxity or PCL laxity.Normal alignment, normal meniscus and normal patellar mobility. Normal pulse.     Left lower leg: Normal.       Feet:  Skin:    General: Skin is warm and dry.     Capillary Refill: Capillary refill takes less than 2 seconds.  Neurological:     General: No focal deficit present.     Mental Status: She is alert and oriented to person, place, and time.  Psychiatric:        Mood and Affect: Mood normal.        Behavior: Behavior normal.        Thought Content: Thought content normal.        Judgment: Judgment normal.      Results for orders placed or performed in visit on 03/06/24  Rapid Strep Screen ([facility] ONLY)   Collection Time: 03/06/24 11:32 AM   Specimen: Other   Other  Result Value Ref Range   Strep Gp A Ag, IA W/Reflex Negative Negative  Culture, Group A Strep   Collection Time: 03/06/24 11:32 AM   Other  Result Value Ref Range   Strep A Culture CANCELED        Pertinent labs & imaging results that were available during my care of the patient were reviewed by me and  considered in my medical decision making.  Assessment & Plan:  Stephinie Battisti was seen today for knee pain, fall and arm pain.  Diagnoses and all orders for this visit:  Acute pain of left knee -     DG Knee 1-2 Views Left -     naproxen  (NAPROSYN ) 500 MG tablet; Take 1 tablet (500 mg total) by mouth 2 (two) times daily with a meal for 14 days.  Bruising -     naproxen  (NAPROSYN ) 500 MG tablet; Take 1 tablet (500 mg total) by mouth 2 (two) times daily with a meal for 14 days.  Chronic right shoulder pain -     DG Shoulder Right -     naproxen  (NAPROSYN ) 500 MG tablet; Take 1 tablet (500 mg total) by mouth 2 (two) times daily  with a meal for 14 days.       Left knee pain with possible nerve involvement Left knee pain with burning and tingling sensation, exacerbated by kneeling and pressure. Possible nerve involvement or avulsion fracture due to recent fall and kneeling. No significant swelling observed. - Ordered x-ray of left knee to assess for avulsion fracture or other abnormalities. - Prescribed naproxen  twice daily for two weeks for anti-inflammatory effect. - Recommended use of a neoprene knee brace for compression. - Advised icing the knee to reduce pain and swelling. - If x-ray shows significant abnormalities, will refer to orthopedics.  Right shoulder pain, likely rotator cuff/impingement syndrome Right shoulder pain with weakness and catching sensation, exacerbated by lifting. Symptoms have persisted since a flu episode in October. Likely rotator cuff impingement syndrome. - Ordered x-ray of right shoulder to evaluate for rotator cuff pathology. - Discussed potential for further imaging (MRI) if x-ray is inconclusive. - Advised continuation of acupuncture for symptom relief.  Right foot contusion after fall Right foot contusion following a fall on an old wood floor. Bruising present but improving.          Continue all other maintenance medications.  Follow up  plan: Return if symptoms worsen or fail to improve.   Continue healthy lifestyle choices, including diet (rich in fruits, vegetables, and lean proteins, and low in salt and simple carbohydrates) and exercise (at least 30 minutes of moderate physical activity daily).  Educational handout given for shoulder pain, knee pain  The above assessment and management plan was discussed with the patient. The patient verbalized understanding of and has agreed to the management plan. Patient is aware to call the clinic if they develop any new symptoms or if symptoms persist or worsen. Patient is aware when to return to the clinic for a follow-up visit. Patient educated on when it is appropriate to go to the emergency department.   Rosaline Bruns, FNP-C [facility] [phone]

## 2024-04-10 DIAGNOSIS — M25511 Pain in right shoulder: Secondary | ICD-10-CM | POA: Diagnosis not present

## 2024-04-10 DIAGNOSIS — M25562 Pain in left knee: Secondary | ICD-10-CM | POA: Diagnosis not present

## 2024-04-10 DIAGNOSIS — M1712 Unilateral primary osteoarthritis, left knee: Secondary | ICD-10-CM | POA: Diagnosis not present

## 2024-04-14 ENCOUNTER — Ambulatory Visit: Payer: Self-pay | Admitting: Family Medicine

## 2024-04-14 DIAGNOSIS — M25562 Pain in left knee: Secondary | ICD-10-CM

## 2024-04-16 DIAGNOSIS — F900 Attention-deficit hyperactivity disorder, predominantly inattentive type: Secondary | ICD-10-CM | POA: Diagnosis not present

## 2024-04-16 DIAGNOSIS — Z630 Problems in relationship with spouse or partner: Secondary | ICD-10-CM | POA: Diagnosis not present

## 2024-04-18 DIAGNOSIS — Z630 Problems in relationship with spouse or partner: Secondary | ICD-10-CM | POA: Diagnosis not present

## 2024-04-18 DIAGNOSIS — F900 Attention-deficit hyperactivity disorder, predominantly inattentive type: Secondary | ICD-10-CM | POA: Diagnosis not present

## 2024-04-19 ENCOUNTER — Other Ambulatory Visit (HOSPITAL_BASED_OUTPATIENT_CLINIC_OR_DEPARTMENT_OTHER): Payer: Self-pay

## 2024-04-23 DIAGNOSIS — Z630 Problems in relationship with spouse or partner: Secondary | ICD-10-CM | POA: Diagnosis not present

## 2024-04-23 DIAGNOSIS — F900 Attention-deficit hyperactivity disorder, predominantly inattentive type: Secondary | ICD-10-CM | POA: Diagnosis not present

## 2024-04-25 DIAGNOSIS — Z630 Problems in relationship with spouse or partner: Secondary | ICD-10-CM | POA: Diagnosis not present

## 2024-04-25 DIAGNOSIS — F900 Attention-deficit hyperactivity disorder, predominantly inattentive type: Secondary | ICD-10-CM | POA: Diagnosis not present

## 2024-04-29 DIAGNOSIS — Z630 Problems in relationship with spouse or partner: Secondary | ICD-10-CM | POA: Diagnosis not present

## 2024-04-29 DIAGNOSIS — F900 Attention-deficit hyperactivity disorder, predominantly inattentive type: Secondary | ICD-10-CM | POA: Diagnosis not present

## 2024-05-27 ENCOUNTER — Telehealth: Payer: Self-pay

## 2024-05-27 NOTE — Telephone Encounter (Signed)
" ° ° °  EFFECTIVE 05/16/24, [facility] WILL BE PREFERRED. APPROVAL LETTER IN MEDIA.  "

## 2024-05-28 ENCOUNTER — Other Ambulatory Visit: Payer: Self-pay

## 2024-05-28 ENCOUNTER — Other Ambulatory Visit (HOSPITAL_BASED_OUTPATIENT_CLINIC_OR_DEPARTMENT_OTHER): Payer: Self-pay

## 2024-05-30 ENCOUNTER — Other Ambulatory Visit (HOSPITAL_COMMUNITY): Payer: Self-pay

## 2024-06-04 ENCOUNTER — Telehealth: Payer: Self-pay

## 2024-06-04 NOTE — Telephone Encounter (Signed)
 Copied from CRM [id number]. Topic: Clinical - Request for Lab/Test Order >> Jun 04, 2024  1:14 PM Larissa RAMAN wrote: Reason for CRM: Patient requesting to have Lab order sent to [facility].

## 2024-06-12 ENCOUNTER — Ambulatory Visit: Admitting: Orthopaedic Surgery

## 2024-07-08 ENCOUNTER — Ambulatory Visit: Admitting: Orthopaedic Surgery

## 2024-09-03 ENCOUNTER — Encounter: Payer: Self-pay | Admitting: Family Medicine
